# Patient Record
Sex: Female | Born: 1935
Health system: Southern US, Community
[De-identification: ages and names within clinical notes are randomized; demographics above are authoritative.]

## PROBLEM LIST (undated history)

## (undated) DIAGNOSIS — K648 Other hemorrhoids: Secondary | ICD-10-CM

## (undated) DIAGNOSIS — K219 Gastro-esophageal reflux disease without esophagitis: Secondary | ICD-10-CM

## (undated) DIAGNOSIS — Z860100 Personal history of colon polyps, unspecified: Secondary | ICD-10-CM

## (undated) DIAGNOSIS — K222 Esophageal obstruction: Secondary | ICD-10-CM

## (undated) DIAGNOSIS — N200 Calculus of kidney: Secondary | ICD-10-CM

## (undated) DIAGNOSIS — K5790 Diverticulosis of intestine, part unspecified, without perforation or abscess without bleeding: Secondary | ICD-10-CM

## (undated) DIAGNOSIS — F0280 Dementia in other diseases classified elsewhere without behavioral disturbance: Principal | ICD-10-CM

## (undated) DIAGNOSIS — K2270 Barrett's esophagus without dysplasia: Secondary | ICD-10-CM

## (undated) DIAGNOSIS — R413 Other amnesia: Secondary | ICD-10-CM

## (undated) DIAGNOSIS — Z90710 Acquired absence of both cervix and uterus: Secondary | ICD-10-CM

## (undated) DIAGNOSIS — F0390 Unspecified dementia without behavioral disturbance: Secondary | ICD-10-CM

## (undated) DIAGNOSIS — E039 Hypothyroidism, unspecified: Secondary | ICD-10-CM

## (undated) DIAGNOSIS — I10 Essential (primary) hypertension: Secondary | ICD-10-CM

## (undated) DIAGNOSIS — G3101 Pick's disease: Principal | ICD-10-CM

## (undated) HISTORY — DX: Acquired absence of both cervix and uterus: Z90.710

## (undated) HISTORY — DX: Pick's disease: G31.01

## (undated) HISTORY — DX: Personal history of colon polyps, unspecified: Z86.0100

## (undated) HISTORY — PX: TONSILLECTOMY: SHX5217

## (undated) HISTORY — PX: APPENDECTOMY: SHX54

## (undated) HISTORY — DX: Essential (primary) hypertension: I10

## (undated) HISTORY — DX: Unspecified dementia, unspecified severity, without behavioral disturbance, psychotic disturbance, mood disturbance, and anxiety: F03.90

## (undated) HISTORY — DX: Personal history of colonic polyps: Z86.010

## (undated) HISTORY — DX: Other hemorrhoids: K64.8

## (undated) HISTORY — DX: Dementia in other diseases classified elsewhere without behavioral disturbance: F02.80

## (undated) HISTORY — DX: Diverticulosis of intestine, part unspecified, without perforation or abscess without bleeding: K57.90

## (undated) HISTORY — PX: BREAST EXCISIONAL BIOPSY: SUR124

## (undated) HISTORY — DX: Gastro-esophageal reflux disease without esophagitis: K21.9

## (undated) HISTORY — PX: THYROIDECTOMY, PARTIAL: SHX18

## (undated) HISTORY — DX: Esophageal obstruction: K22.2

## (undated) HISTORY — DX: Barrett's esophagus without dysplasia: K22.70

## (undated) HISTORY — DX: Other amnesia: R41.3

## (undated) HISTORY — DX: Hypothyroidism, unspecified: E03.9

## (undated) HISTORY — DX: Calculus of kidney: N20.0

---

## 1997-12-15 ENCOUNTER — Other Ambulatory Visit: Admission: RE | Admit: 1997-12-15 | Discharge: 1997-12-15 | Payer: Self-pay | Admitting: Internal Medicine

## 1998-01-17 ENCOUNTER — Encounter: Payer: Self-pay | Admitting: Internal Medicine

## 1999-01-22 ENCOUNTER — Other Ambulatory Visit: Admission: RE | Admit: 1999-01-22 | Discharge: 1999-01-22 | Payer: Self-pay | Admitting: Internal Medicine

## 2000-01-21 ENCOUNTER — Encounter: Payer: Self-pay | Admitting: Internal Medicine

## 2000-01-21 ENCOUNTER — Encounter: Admission: RE | Admit: 2000-01-21 | Discharge: 2000-01-21 | Payer: Self-pay | Admitting: Internal Medicine

## 2000-01-29 ENCOUNTER — Other Ambulatory Visit: Admission: RE | Admit: 2000-01-29 | Discharge: 2000-01-29 | Payer: Self-pay | Admitting: Internal Medicine

## 2000-10-21 ENCOUNTER — Encounter: Payer: Self-pay | Admitting: Emergency Medicine

## 2000-10-21 ENCOUNTER — Emergency Department (HOSPITAL_COMMUNITY): Admission: EM | Admit: 2000-10-21 | Discharge: 2000-10-21 | Payer: Self-pay | Admitting: Emergency Medicine

## 2001-01-28 ENCOUNTER — Encounter: Payer: Self-pay | Admitting: Internal Medicine

## 2001-01-28 ENCOUNTER — Encounter: Admission: RE | Admit: 2001-01-28 | Discharge: 2001-01-28 | Payer: Self-pay | Admitting: Internal Medicine

## 2002-03-10 ENCOUNTER — Encounter: Payer: Self-pay | Admitting: Internal Medicine

## 2002-03-10 ENCOUNTER — Encounter: Admission: RE | Admit: 2002-03-10 | Discharge: 2002-03-10 | Payer: Self-pay | Admitting: Internal Medicine

## 2003-04-04 ENCOUNTER — Encounter: Payer: Self-pay | Admitting: Internal Medicine

## 2003-04-04 ENCOUNTER — Encounter: Admission: RE | Admit: 2003-04-04 | Discharge: 2003-04-04 | Payer: Self-pay | Admitting: Internal Medicine

## 2003-04-10 ENCOUNTER — Encounter (INDEPENDENT_AMBULATORY_CARE_PROVIDER_SITE_OTHER): Payer: Self-pay | Admitting: *Deleted

## 2003-04-10 ENCOUNTER — Encounter: Payer: Self-pay | Admitting: Internal Medicine

## 2003-04-10 ENCOUNTER — Encounter: Admission: RE | Admit: 2003-04-10 | Discharge: 2003-04-10 | Payer: Self-pay | Admitting: Internal Medicine

## 2003-05-22 ENCOUNTER — Encounter (INDEPENDENT_AMBULATORY_CARE_PROVIDER_SITE_OTHER): Payer: Self-pay | Admitting: *Deleted

## 2003-05-22 ENCOUNTER — Ambulatory Visit (HOSPITAL_COMMUNITY): Admission: RE | Admit: 2003-05-22 | Discharge: 2003-05-22 | Payer: Self-pay | Admitting: *Deleted

## 2003-05-22 ENCOUNTER — Encounter: Payer: Self-pay | Admitting: Internal Medicine

## 2004-05-13 ENCOUNTER — Encounter: Admission: RE | Admit: 2004-05-13 | Discharge: 2004-05-13 | Payer: Self-pay | Admitting: Internal Medicine

## 2005-05-28 ENCOUNTER — Encounter: Admission: RE | Admit: 2005-05-28 | Discharge: 2005-05-28 | Payer: Self-pay | Admitting: Internal Medicine

## 2006-06-04 ENCOUNTER — Encounter: Admission: RE | Admit: 2006-06-04 | Discharge: 2006-06-04 | Payer: Self-pay | Admitting: Internal Medicine

## 2007-01-12 ENCOUNTER — Ambulatory Visit (HOSPITAL_COMMUNITY): Admission: RE | Admit: 2007-01-12 | Discharge: 2007-01-12 | Payer: Self-pay | Admitting: Internal Medicine

## 2007-06-07 ENCOUNTER — Ambulatory Visit (HOSPITAL_COMMUNITY): Admission: RE | Admit: 2007-06-07 | Discharge: 2007-06-07 | Payer: Self-pay | Admitting: Internal Medicine

## 2007-06-10 ENCOUNTER — Ambulatory Visit (HOSPITAL_COMMUNITY): Admission: RE | Admit: 2007-06-10 | Discharge: 2007-06-10 | Payer: Self-pay | Admitting: Internal Medicine

## 2007-06-10 ENCOUNTER — Encounter (INDEPENDENT_AMBULATORY_CARE_PROVIDER_SITE_OTHER): Payer: Self-pay | Admitting: *Deleted

## 2008-06-07 ENCOUNTER — Ambulatory Visit (HOSPITAL_COMMUNITY): Admission: RE | Admit: 2008-06-07 | Discharge: 2008-06-07 | Payer: Self-pay | Admitting: Internal Medicine

## 2008-09-13 ENCOUNTER — Ambulatory Visit (HOSPITAL_COMMUNITY): Admission: RE | Admit: 2008-09-13 | Discharge: 2008-09-13 | Payer: Self-pay | Admitting: Internal Medicine

## 2008-09-29 ENCOUNTER — Encounter: Payer: Self-pay | Admitting: Internal Medicine

## 2009-03-16 ENCOUNTER — Encounter: Payer: Self-pay | Admitting: Internal Medicine

## 2009-04-18 ENCOUNTER — Ambulatory Visit: Payer: Self-pay | Admitting: Internal Medicine

## 2009-04-18 DIAGNOSIS — R933 Abnormal findings on diagnostic imaging of other parts of digestive tract: Secondary | ICD-10-CM | POA: Insufficient documentation

## 2009-04-18 DIAGNOSIS — R1319 Other dysphagia: Secondary | ICD-10-CM

## 2009-04-18 DIAGNOSIS — R1013 Epigastric pain: Secondary | ICD-10-CM

## 2009-05-03 ENCOUNTER — Ambulatory Visit: Payer: Self-pay | Admitting: Internal Medicine

## 2009-05-03 ENCOUNTER — Encounter: Payer: Self-pay | Admitting: Internal Medicine

## 2009-05-07 ENCOUNTER — Encounter: Payer: Self-pay | Admitting: Internal Medicine

## 2009-06-08 ENCOUNTER — Ambulatory Visit: Payer: Self-pay | Admitting: Internal Medicine

## 2009-06-08 DIAGNOSIS — K222 Esophageal obstruction: Secondary | ICD-10-CM | POA: Insufficient documentation

## 2009-06-08 DIAGNOSIS — K2270 Barrett's esophagus without dysplasia: Secondary | ICD-10-CM

## 2009-06-08 DIAGNOSIS — K219 Gastro-esophageal reflux disease without esophagitis: Secondary | ICD-10-CM | POA: Insufficient documentation

## 2009-06-09 ENCOUNTER — Emergency Department (HOSPITAL_COMMUNITY): Admission: EM | Admit: 2009-06-09 | Discharge: 2009-06-09 | Payer: Self-pay | Admitting: Emergency Medicine

## 2009-12-03 ENCOUNTER — Ambulatory Visit (HOSPITAL_COMMUNITY): Admission: RE | Admit: 2009-12-03 | Discharge: 2009-12-03 | Payer: Self-pay | Admitting: Internal Medicine

## 2010-03-13 ENCOUNTER — Encounter: Payer: Self-pay | Admitting: Internal Medicine

## 2010-03-18 ENCOUNTER — Encounter: Payer: Self-pay | Admitting: Internal Medicine

## 2010-04-22 ENCOUNTER — Encounter: Payer: Self-pay | Admitting: Internal Medicine

## 2010-05-31 ENCOUNTER — Encounter (INDEPENDENT_AMBULATORY_CARE_PROVIDER_SITE_OTHER): Payer: Self-pay | Admitting: *Deleted

## 2010-06-03 ENCOUNTER — Ambulatory Visit: Payer: Self-pay | Admitting: Internal Medicine

## 2010-06-20 ENCOUNTER — Ambulatory Visit: Payer: Self-pay | Admitting: Internal Medicine

## 2010-06-21 ENCOUNTER — Encounter: Payer: Self-pay | Admitting: Internal Medicine

## 2010-09-24 NOTE — Letter (Signed)
Summary: Previsit letter  [name]  [address] [place], [place] [address]   Phone: [phone]  Fax: [phone]       04/22/2010 MRN: [id number]  Christina Camacho [address] [place], [place]  [address]  Dear Ms. Dauber,  Welcome to the Gastroenterology Division at [facility].    You are scheduled to see a nurse for your pre-procedure visit on 06-03-10 at 10am on the 3rd floor at [facility], [address]  We ask that you try to arrive at our office 15 minutes prior to your appointment time to allow for check-in. Your Endoscopy is scheduled for 06-20-10 9am.  Your nurse visit will consist of discussing your medical and surgical history, your immediate family medical history, and your medications.    Please bring a complete list of all your medications or, if you prefer, bring the medication bottles and we will list them.  We will need to be aware of both prescribed and over the counter drugs.  We will need to know exact dosage information as well.  If you are on blood thinners (Coumadin, Plavix, Aggrenox, Ticlid, etc.) please call our office today/prior to your appointment, as we need to consult with your physician about holding your medication.   Please be prepared to read and sign documents such as consent forms, a financial agreement, and acknowledgement forms.  If necessary, and with your consent, a friend or relative is welcome to sit-in on the nurse visit with you.  Please bring your insurance card so that we may make a copy of it.  If your insurance requires a referral to see a specialist, please bring your referral form from your primary care physician.  No co-pay is required for this nurse visit.     If you cannot keep your appointment, please call [phone] to cancel or reschedule prior to your appointment date.  This allows [place] the opportunity to schedule an appointment for another patient in need of care.    Thank you for choosing  [facility] for your medical needs.  We appreciate the opportunity to care for you.  Please visit [place] at our website  to learn more about our practice.                     Sincerely.                                                                                                                   The Gastroenterology Division

## 2010-09-24 NOTE — Procedures (Signed)
Summary: Recall Assessment  Recall Assessment   Imported By: [place] 06/24/2010 10:18:31  _____________________________________________________________________  External Attachment:    Type:   Image     Comment:   External Document

## 2010-09-24 NOTE — Procedures (Signed)
Summary: Upper Endoscopy  Patient: [name] Note: All result statuses are Final unless otherwise noted.  Tests: (1) Upper Endoscopy (EGD)   EGD Upper Endoscopy       DONE     [facility]     [address]     [place], [place]  [address]           ENDOSCOPY PROCEDURE REPORT           PATIENT:  Christina Camacho, Christina Camacho  MR#:  [id number]     BIRTHDATE:  [birth year], 81 yrs. old  GENDER:  female           ENDOSCOPIST:  Wilhemina Bonito. Eda Keys, MD     Referred by:  Surveillance Program Recall           PROCEDURE DATE:  06/20/2010     PROCEDURE:  EGD with biopsy, [address]     ASA CLASS:  Class II     INDICATIONS:  h/o Barrett's Esophagus ; INDEX EXAM 04-2009 W/ SHORT     SEGMENT BARRETS W/O DYSPLASIA           MEDICATIONS:   Fentanyl 25 mcg IV, Versed 3 mg IV     TOPICAL ANESTHETIC:  Exactacain Spray           DESCRIPTION OF PROCEDURE:   After the risks benefits and     alternatives of the procedure were thoroughly explained, informed     consent was obtained.  The LB GIF-H180 [id number] endoscope was     introduced through the mouth and advanced to the second portion of     the duodenum, without limitations.  The instrument was slowly     withdrawn as the mucosa was fully examined.     <<PROCEDUREIMAGES>>           Short segment (1cm tongue) Barrett's esophagus was found in the     distal esophagus.  A benign 15mm stricture was found in the distal     esophagus.  Otherwise the examination was normal to D2.     Retroflexed views revealed no abnormalities.    The scope was then     withdrawn from the patient and the procedure completed.           COMPLICATIONS:  None           ENDOSCOPIC IMPRESSION:     1) Short segment Barrett's esophagus in the distal esophagus     2) Stricture in the distal esophagus     3) Otherwise normal examination     4) GERD           RECOMMENDATIONS:     1) Await pathology results     2) REPEAT SURVEILLANCE EGD IN 3 YEARS, if no dysplasia     3)  Continue Prilosec           ______________________________     Wilhemina Bonito. Eda Keys, MD           CC:  Geoffry Paradise, MD, The Patient           n.     eSIGNED:   Wilhemina Bonito. Eda Keys at 06/20/2010 10:00 AM           Loyal Gambler, [id number]  Note: An exclamation mark (!) indicates a result that was not dispersed into the flowsheet. Document Creation Date: 06/20/2010 10:02 AM _______________________________________________________________________  (1) Order result status: Final Collection or observation date-time: 06/20/2010 09:50 Requested date-time:  Receipt  date-time:  Reported date-time:  Referring Physician:   Ordering Physician: Fransico Setters [phone]) Specimen Source:  Source: Launa Grill Order Number: [id number] Lab site:   Appended Document: Upper Endoscopy     Procedures Next Due Date:    EGD: 05/2013

## 2010-09-24 NOTE — Letter (Signed)
Summary: Patient Notice-[name]  [address] [name], [place] [address]   Phone: [phone]  Fax: [phone]        June 21, 2010 MRN: [id number]    Christina Camacho [address] [place], [place]  [address]    Dear Ms. Staniszewski,  I am pleased to inform you that the biopsies taken during your recent endoscopic examination did not show any evidence of dysplasia or cancer upon pathologic examination.  However, your biopsies indicate you have a condition known as Barrett's esophagus. While not cancer, it is pre-cancerous (can progress to cancer) and needs to be monitored with repeat endoscopic examination and biopsies.  Fortunately, it is quite rare that this develops into cancer, but careful monitoring of the condition along with taking your medication as prescribed is important in reducing the risk of developing cancer.  It is my recommendation that you have a repeat upper gastrointestinal endoscopic examination in 3 years.  Additional information/recommendations:   __Continue with treatment plan as outlined the day of your exam.    Please call us if you have or develop heartburn, reflux symptoms, any swallowing problems, or if you have questions about your condition that have not been fully answered at this time.  Sincerely,  Hilarie Fredrickson MD  This letter has been electronically signed by your physician.  Appended Document: Patient Notice-[facility] letter mailed

## 2010-09-24 NOTE — Miscellaneous (Signed)
Summary: previsit/RM  Clinical Lists Changes  Observations: Added new observation of ALLERGY REV: Done (06/03/2010 9:59)

## 2010-09-24 NOTE — Letter (Signed)
Summary: EGD Instructions  [facility]  [address] [place], [place] [address]   Phone: [phone]  Fax: [phone]       Christina Camacho    Feb 25, 1929    MRN: [id number]       Procedure Day Dorna Bloom:  Lenor Coffin  06/20/10     Arrival Time:  8:00AM     Procedure Time:  9:00AM     Location of Procedure:                    _ X _ [facility] (4th Floor)   PREPARATION FOR ENDOSCOPY   On 06/20/10 THE DAY OF THE PROCEDURE:  1.   No solid foods, milk or milk products are allowed after midnight the night before your procedure.  2.   Do not drink anything colored red or purple.  Avoid juices with pulp.  No orange juice.  3.  You may drink clear liquids until 7:00AM, which is 2 hours before your procedure.                                                                                                CLEAR LIQUIDS INCLUDE: Water Jello Ice Popsicles Tea (sugar ok, no milk/cream) Powdered fruit flavored drinks Coffee (sugar ok, no milk/cream) Gatorade Juice: apple, white grape, white cranberry  Lemonade Clear bullion, consomm, broth Carbonated beverages (any kind) Strained chicken noodle soup Hard Candy   MEDICATION INSTRUCTIONS  Unless otherwise instructed, you should take regular prescription medications with a small sip of water as early as possible the morning of your procedure.    Additional medication instructions: Hold Triamterene-HCTZ morning of procedure only.  May take other prescription medications morning of procedure.             OTHER INSTRUCTIONS  You will need a responsible adult at least 75 years of age to accompany you and drive you home.   This person must remain in the waiting room during your procedure.  Wear loose fitting clothing that is easily removed.  Leave jewelry and other valuables at home.  However, you may wish to bring a book to read or an iPod/MP3 player to listen to music as you wait for your procedure  to start.  Remove all body piercing jewelry and leave at home.  Total time from sign-in until discharge is approximately 2-3 hours.  You should go home directly after your procedure and rest.  You can resume normal activities the day after your procedure.  The day of your procedure you should not:   Drive   Make legal decisions   Operate machinery   Drink alcohol   Return to work  You will receive specific instructions about eating, activities and medications before you leave.    The above instructions have been reviewed and explained to me by   Sherren Kerns RN  June 03, 2010 10:27 AM    I fully understand and can verbalize these instructions _____________________________ Date _________

## 2010-09-24 NOTE — Letter (Signed)
Summary: Endoscopy Letter  [facility]  [address] [place], [place] [address]   Phone: [phone]  Fax: [phone]      March 18, 2010 MRN: [id number]   Christina Camacho [address] [place], [place]  [address]   Dear Ms. Kassin,   According to your medical record, it is time for you to schedule an Endoscopy. Endoscopic screening is recommended for patients with certain upper digestive tract conditions because of associated increased risk for cancers of the upper digestive system.  This letter has been generated based on the recommendations made at the time of your prior procedure. If you feel that in your particular situation this may no longer apply, please contact our office.  Please call our office at [phone]) to schedule this appointment or to update your records at your earliest convenience.  Thank you for cooperating with [place] to provide you with the very best care possible.   Sincerely,  Wilhemina Bonito. Marina Goodell, M.D.  [facility] [phone]

## 2010-11-20 ENCOUNTER — Other Ambulatory Visit (HOSPITAL_COMMUNITY): Payer: Self-pay | Admitting: Internal Medicine

## 2010-11-20 DIAGNOSIS — Z1231 Encounter for screening mammogram for malignant neoplasm of breast: Secondary | ICD-10-CM

## 2010-11-28 LAB — URINE CULTURE

## 2010-11-28 LAB — URINALYSIS, ROUTINE W REFLEX MICROSCOPIC
Glucose, UA: NEGATIVE mg/dL
Nitrite: NEGATIVE
Protein, ur: 100 mg/dL — AB

## 2010-11-28 LAB — URINE MICROSCOPIC-ADD ON

## 2011-01-10 NOTE — Op Note (Signed)
   NAME:  Christina Camacho, Christina Camacho NO.:  [id number]   MEDICAL RECORD NO.:  [id number]                   PATIENT TYPE:  AMB   LOCATION:  ENDO                                 FACILITY:  [facility]   PHYSICIAN:  Georgiana Spinner, M.D.                 DATE OF BIRTH:  [birth year]   DATE OF PROCEDURE:  DATE OF DISCHARGE:                                 OPERATIVE REPORT   PROCEDURE:  Colonoscopy.   INDICATIONS:  Colon polyps.   ANESTHESIA:  Demerol 90 mg, Versed 9.   DESCRIPTION OF PROCEDURE:  With the patient mildly sedated and in the left  lateral decubitus position, the Olympus videoscopic colonoscope was inserted  in the rectum and passed through a rather tortuous colon to the cecum,  identified by base of cecum and ileocecal valve, both of which were  photographed.  From this point,  the colonoscope was slowly withdrawn,  taking circumferential views of the entire colonic mucosa stopping only then  in the rectum which appeared normal on direct and retroflexed view.  The  endoscope was straightened and withdrawn.  The patient's vital signs and  pulse oximetry remained stable.  The patient tolerated the procedure well  without apparent complications.   FINDINGS:  1. Diverticulosis of the sigmoid colon.  2. Somewhat tortuous colon.  3. Otherwise an unremarkable colonoscopic examination.   PLAN:  Repeat examination possibly in about five years.                                               Georgiana Spinner, M.D.    GMO/MEDQ  D:  05/22/2003  T:  05/22/2003  Job:  [id number]   cc:   Geoffry Paradise, M.D.  [address]  [place]  [place] [address]  Fax: [phone]

## 2011-01-31 ENCOUNTER — Ambulatory Visit (HOSPITAL_COMMUNITY)
Admission: RE | Admit: 2011-01-31 | Discharge: 2011-01-31 | Disposition: A | Discharge status: Home / Self Care | Payer: Medicare Other | Source: Ambulatory Visit | Attending: Internal Medicine | Admitting: Internal Medicine

## 2011-01-31 DIAGNOSIS — Z1231 Encounter for screening mammogram for malignant neoplasm of breast: Secondary | ICD-10-CM | POA: Insufficient documentation

## 2012-01-12 ENCOUNTER — Other Ambulatory Visit (HOSPITAL_COMMUNITY): Payer: Self-pay | Admitting: Internal Medicine

## 2012-01-12 DIAGNOSIS — Z1231 Encounter for screening mammogram for malignant neoplasm of breast: Secondary | ICD-10-CM

## 2012-07-08 ENCOUNTER — Ambulatory Visit (HOSPITAL_COMMUNITY)
Admission: RE | Admit: 2012-07-08 | Discharge: 2012-07-08 | Disposition: A | Discharge status: Home / Self Care | Payer: Medicare Other | Source: Ambulatory Visit | Attending: Internal Medicine | Admitting: Internal Medicine

## 2012-07-08 DIAGNOSIS — Z1231 Encounter for screening mammogram for malignant neoplasm of breast: Secondary | ICD-10-CM | POA: Insufficient documentation

## 2012-07-15 ENCOUNTER — Ambulatory Visit (HOSPITAL_COMMUNITY): Payer: Medicare Other

## 2012-08-09 ENCOUNTER — Encounter: Payer: Self-pay | Admitting: *Deleted

## 2012-08-10 ENCOUNTER — Ambulatory Visit (INDEPENDENT_AMBULATORY_CARE_PROVIDER_SITE_OTHER): Payer: Medicare Other | Admitting: Cardiovascular Disease

## 2012-08-10 ENCOUNTER — Encounter: Payer: Self-pay | Admitting: Cardiovascular Disease

## 2012-08-10 VITALS — BP 124/62 | HR 64 | Ht 61.0 in | Wt 111.0 lb

## 2012-08-10 DIAGNOSIS — I10 Essential (primary) hypertension: Secondary | ICD-10-CM | POA: Insufficient documentation

## 2012-08-10 DIAGNOSIS — R079 Chest pain, unspecified: Secondary | ICD-10-CM

## 2012-08-10 DIAGNOSIS — R9431 Abnormal electrocardiogram [ECG] [EKG]: Secondary | ICD-10-CM

## 2012-08-10 NOTE — Assessment & Plan Note (Signed)
Well controlled.  Continue current medications and low sodium Dash type diet.   Continue diuretic 

## 2012-08-10 NOTE — Patient Instructions (Addendum)
Your physician recommends that you schedule a follow-up appointment in: AS NEEDED  Your physician recommends that you continue on your current medications as directed. Please refer to the Current Medication list given to you today.  Your physician has requested that you have en exercise stress myoview. For further information please visit [email or web address] Please follow instruction sheet, as given. DX ABN EKG  CHEST PAIN

## 2012-08-10 NOTE — Assessment & Plan Note (Signed)
Atypical but elderly with abnormal ECG F/U stress myovue

## 2012-08-10 NOTE — Progress Notes (Signed)
Patient ID: Christina Camacho, female   DOB: Jan 26, 1929, 78 y.o.   MRN: [id number] 78 yo referred by [facility] medical Dr Jacky Kindle.  Previous husband died of AV failure and current husband just had CABG.  CRF;s HTN Rx with diuretic for last two years.  She has had SSCP Atypical and not always exertional Sometimes feels pain when laying down.  No dyspnea palpitations or syncope Compliant with BP meds.  No recent stress testing.  ECG abnormal with septal Q;s nonspecific lateral T wave changes and poor R wave progression.  Can walk well enough on treadmill.  Pain has been for a couple of months but is gettinng better.  Reviewed labs and TSH .37 on replacement followed by Dr Jacky Kindle Other labs from 11/11/ 13 normal.  Has GERD with Barretts but pains have no GI overtones  ROS: Denies fever, malais, weight loss, blurry vision, decreased visual acuity, cough, sputum, SOB, hemoptysis, pleuritic pain, palpitaitons, heartburn, abdominal pain, melena, lower extremity edema, claudication, or rash.  All other systems reviewed and negative   General: Affect appropriate Healthy:  appears stated age HEENT: normal Neck supple with no adenopathy JVP normal no bruits no thyromegaly Lungs clear with no wheezing and good diaphragmatic motion Heart:  S1/S2 no murmur,rub, gallop or click PMI normal Abdomen: benighn, BS positve, no tenderness, no AAA no bruit.  No HSM or HJR Distal pulses intact with no bruits No edema Neuro non-focal Skin warm and dry No muscular weakness  Medications Current Outpatient Prescriptions  Medication Sig Dispense Refill  . aspirin 81 MG tablet Take 81 mg by mouth daily.      . Calcium 600-200 MG-UNIT per tablet Take 1 tablet by mouth 3 (three) times daily with meals.      . cholecalciferol (VITAMIN D) 1000 UNITS tablet Take 1,000 Units by mouth 3 (three) times daily.      Marland Kitchen levothyroxine (SYNTHROID, LEVOTHROID) 50 MCG tablet Take by mouth daily. 50mg  Monday thru Friday. Saturday and  Sunday 25mg  (half tablet)      . Multiple Vitamins-Minerals (ICAPS MV) TABS Take 1 tablet by mouth daily.      . nebivolol (BYSTOLIC) 10 MG tablet Take 10 mg by mouth daily.      Marland Kitchen omeprazole (PRILOSEC) 20 MG capsule Take 20 mg by mouth daily.       Marland Kitchen triamterene-hydrochlorothiazide (MAXZIDE-25) 37.5-25 MG per tablet Take 1 tablet by mouth daily.       . zoledronic acid (RECLAST) 5 MG/100ML SOLN Inject 5 mg into the vein once. yearly        Allergies Review of patient's allergies indicates no known allergies.  Family History: No family history on file.  Social History: History   Social History  . Marital Status: Married    Spouse Name: N/A    Number of Children: N/A  . Years of Education: N/A   Occupational History  . Not on file.   Social History Main Topics  . Smoking status: Former Smoker -- 1.0 packs/day for 38 years    Types: Cigarettes    Start date: 08/25/1948    Quit date: 08/10/1987  . Smokeless tobacco: Not on file  . Alcohol Use: No  . Drug Use: No  . Sexually Active: Not on file   Other Topics Concern  . Not on file   Social History Narrative  . No narrative on file    Electrocardiogram: NSR rate 69 Poor R wave progression nonspecfic ST changes   Assessment and Plan

## 2012-08-16 ENCOUNTER — Encounter: Payer: Self-pay | Admitting: Cardiovascular Disease

## 2012-08-31 ENCOUNTER — Ambulatory Visit (HOSPITAL_COMMUNITY): Payer: Medicare Other | Attending: Cardiology | Admitting: Radiology

## 2012-08-31 VITALS — BP 135/79 | HR 57 | Ht 61.0 in | Wt 107.0 lb

## 2012-08-31 DIAGNOSIS — R079 Chest pain, unspecified: Secondary | ICD-10-CM | POA: Insufficient documentation

## 2012-08-31 DIAGNOSIS — I10 Essential (primary) hypertension: Secondary | ICD-10-CM | POA: Insufficient documentation

## 2012-08-31 DIAGNOSIS — R9431 Abnormal electrocardiogram [ECG] [EKG]: Secondary | ICD-10-CM

## 2012-08-31 MED ORDER — TECHNETIUM TC 99M SESTAMIBI GENERIC - CARDIOLITE
32.3000 | Freq: Once | INTRAVENOUS | Status: AC | PRN
  Administered 2012-08-31: 32.3 via INTRAVENOUS

## 2012-08-31 MED ORDER — TECHNETIUM TC 99M SESTAMIBI GENERIC - CARDIOLITE
9.2000 | Freq: Once | INTRAVENOUS | Status: AC | PRN
  Administered 2012-08-31: 9 via INTRAVENOUS

## 2012-08-31 NOTE — Progress Notes (Addendum)
[facility] SITE 3 NUCLEAR MED [address] [place], [place] [address] [phone]    Cardiology Nuclear Med Study  Christina Camacho is a 79 y.o. female     MRN : [id number]     DOB: 1928/11/02  Procedure Date: 08/31/2012  Nuclear Med Background Indication for Stress Test:  Evaluation for Ischemia and Abnormal EKG History:  ~10 yrs ago Echo:OK per patient Cardiac Risk Factors: History of Smoking and Hypertension  Symptoms:  Chest Pain with and without Exertion (last episode of chest discomfort was about 7-weeks ago with stress of husbands CABG)   Nuclear Pre-Procedure Caffeine/Decaff Intake:  None NPO After: 6:30pm   Lungs:  Clear. O2 Sat: 98% on room air. IV 0.9% NS with Angio Cath:  22g  IV Site: R Antecubital  IV Started by:  Doyne Keel, CNMT  Chest Size (in):  36 Cup Size: B  Height: 5\' 1"  (1.549 m)  Weight:  107 lb (48.535 kg)  BMI:  Body mass index is 20.22 kg/(m^2). Tech Comments:  Bystolic not held per Dr. Eden Emms.    Nuclear Med Study 1 or 2 day study: 1 day  Stress Test Type:  Stress  Reading MD: Willa Rough, MD  Order Authorizing Provider:  Charlton Haws, MD  Resting Radionuclide: Technetium 45m Sestamibi  Resting Radionuclide Dose: 9.2 mCi   Stress Radionuclide:  Technetium 37m Sestamibi  Stress Radionuclide Dose: 32.3 mCi           Stress Protocol Rest HR: 57 Stress HR: 120  Rest BP: 135/79 Stress BP: 204/74  Exercise Time (min): 7:00 METS: 8.5   Predicted Max HR: 139 bpm % Max HR: 86.33 bpm Rate Pressure Product: 69629    Dose of Adenosine (mg):  n/a Dose of Lexiscan: n/a mg  Dose of Atropine (mg): n/a Dose of Dobutamine: n/a mcg/kg/min (at max HR)  Stress Test Technologist: Smiley Houseman, CMA-N  Nuclear Technologist:  Domenic Polite, CNMT     Rest Procedure:  Myocardial perfusion imaging was performed at rest 45 minutes following the intravenous administration of Technetium 81m Sestamibi.  Rest ECG: Normal sinus rhythm. Inverted T  wave in aVL. Mild diffuse ST scooping  Stress Procedure:  The patient exercised on the treadmill utilizing the Bruce Protocol for seven  minutes. The patient stopped due to fatigue and denied any chest pain.  Technetium 30m Sestamibi was injected at peak exercise and myocardial perfusion imaging was performed after a brief delay.  Stress ECG: With peak stress the ST scooping becomes slightly more marked. There is no diagnostic change.  QPS Raw Data Images:  Patient motion noted; appropriate software correction applied. Stress Images:  Normal homogeneous uptake in all areas of the myocardium. Rest Images:  Normal homogeneous uptake in all areas of the myocardium. Subtraction (SDS):  No evidence of ischemia. Transient Ischemic Dilatation (Normal <1.22):  0.90 Lung/Heart Ratio (Normal <0.45):  0.34  Quantitative Gated Spect Images QGS EDV:  54 ml QGS ESV:  11 ml  Impression Exercise Capacity:  Good exercise capacity. BP Response:  Hypertensive blood pressure response. Clinical Symptoms:  No significant symptoms noted. ECG Impression:  No significant ST segment change suggestive of ischemia. Comparison with Prior Nuclear Study: No previous nuclear study performed  Overall Impression:  Normal stress nuclear study. This is a low risk scan.  LV Ejection Fraction: 80%.  LV Wall Motion:  Normal Wall Motion.  Willa Rough. MD

## 2013-05-16 ENCOUNTER — Encounter: Payer: Self-pay | Admitting: Internal Medicine

## 2013-08-30 ENCOUNTER — Other Ambulatory Visit (HOSPITAL_COMMUNITY): Payer: Self-pay | Admitting: Internal Medicine

## 2013-08-30 DIAGNOSIS — Z1231 Encounter for screening mammogram for malignant neoplasm of breast: Secondary | ICD-10-CM

## 2013-09-23 ENCOUNTER — Ambulatory Visit (HOSPITAL_COMMUNITY)
Admission: RE | Admit: 2013-09-23 | Discharge: 2013-09-23 | Disposition: A | Discharge status: Home / Self Care | Payer: Medicare Other | Source: Ambulatory Visit | Attending: Internal Medicine | Admitting: Internal Medicine

## 2013-09-23 DIAGNOSIS — Z1231 Encounter for screening mammogram for malignant neoplasm of breast: Secondary | ICD-10-CM

## 2013-11-14 ENCOUNTER — Encounter: Payer: Self-pay | Admitting: Internal Medicine

## 2014-03-17 ENCOUNTER — Encounter: Payer: Self-pay | Admitting: Internal Medicine

## 2014-03-17 ENCOUNTER — Ambulatory Visit (INDEPENDENT_AMBULATORY_CARE_PROVIDER_SITE_OTHER): Payer: Medicare Other | Admitting: Internal Medicine

## 2014-03-17 VITALS — BP 160/72 | HR 60 | Ht 59.5 in | Wt 111.0 lb

## 2014-03-17 DIAGNOSIS — K219 Gastro-esophageal reflux disease without esophagitis: Secondary | ICD-10-CM

## 2014-03-17 DIAGNOSIS — K2270 Barrett's esophagus without dysplasia: Secondary | ICD-10-CM

## 2014-03-17 NOTE — Progress Notes (Signed)
HISTORY OF PRESENT ILLNESS:  Christina Camacho is a 80 y.o. female with GERD complicated by Barrett's esophagus who presents today, with her husband, regarding surveillance upper endoscopy. Patient last underwent upper endoscopy October 2011. Short segment Barrett's without dysplasia. Followup in 3 years if medically fit and willing. She continues on omeprazole and ranitidine for GERD. Symptoms well controlled. No dysphagia. She remains very active and travels extensively. GI review of systems is otherwise negative.                       REVIEW OF SYSTEMS:  All non-GI ROS negative upon review  Past Medical History  Diagnosis Date  . Hypertension   . Hypothyroidism   . History of colonic polyps   . Kidney stone     pt denies 03/17/14  . Diverticulosis   . History of hysterectomy   . Barrett's esophagus   . GERD (gastroesophageal reflux disease)   . Esophageal stricture   . Internal hemorrhoids     Past Surgical History  Procedure Laterality Date  . Appendectomy      with hysterectomy  . Tonsillectomy    . Thyroidectomy, partial      Social History Christina Camacho  reports that she quit smoking about 26 years ago. Her smoking use included Cigarettes. She started smoking about 65 years ago. She has a 38 pack-year smoking history. She has never used smokeless tobacco. She reports that she does not drink alcohol or use illicit drugs.  family history includes Emphysema in her father; Stroke in her brother.  No Known Allergies     PHYSICAL EXAMINATION: Vital signs: BP 160/72  Pulse 60  Ht 4' 11.5" (1.511 m)  Wt 111 lb (50.349 kg)  BMI 22.05 kg/m2 General: Well-developed, well-nourished, no acute distress HEENT: Sclerae are anicteric, conjunctiva pink. Oral mucosa intact Lungs: Clear Heart: Regular Abdomen: soft, nontender, nondistended, no obvious ascites, no peritoneal signs, normal bowel sounds. No organomegaly. Extremities: No edema Psychiatric: alert and oriented x3.  Cooperative     ASSESSMENT:  #1. GERD complicated by Barrett's esophagus. Last EGD with biopsies 2011. On dysplastic Barrett's   PLAN:  #1. We did discuss the pros and cons of surveillance endoscopy with biopsies. As well we discussed concerns over long term PPI use (per her request). He also discussed the state of current ablative therapies for dysplastic Barrett's, if needed. To this end, given her current state of excellent health, she was interested in proceeding.The nature of the procedure, as well as the risks, benefits, and alternatives were carefully and thoroughly reviewed with the patient. Ample time for discussion and questions allowed. The patient understood, was satisfied, and agreed to proceed.

## 2014-03-17 NOTE — Patient Instructions (Signed)
You have been scheduled for an endoscopy. Please follow written instructions given to you at your visit today. If you use inhalers (even only as needed), please bring them with you on the day of your procedure.   

## 2014-03-23 ENCOUNTER — Encounter: Payer: Self-pay | Admitting: Internal Medicine

## 2014-04-10 ENCOUNTER — Other Ambulatory Visit (HOSPITAL_COMMUNITY): Payer: Self-pay | Admitting: Internal Medicine

## 2014-04-10 DIAGNOSIS — Z1231 Encounter for screening mammogram for malignant neoplasm of breast: Secondary | ICD-10-CM

## 2014-05-10 ENCOUNTER — Ambulatory Visit (AMBULATORY_SURGERY_CENTER): Payer: Medicare Other | Admitting: Internal Medicine

## 2014-05-10 ENCOUNTER — Encounter: Payer: Self-pay | Admitting: Internal Medicine

## 2014-05-10 VITALS — BP 154/65 | HR 58 | Temp 97.4°F | Resp 16 | Ht 59.5 in | Wt 111.0 lb

## 2014-05-10 DIAGNOSIS — K2270 Barrett's esophagus without dysplasia: Secondary | ICD-10-CM

## 2014-05-10 DIAGNOSIS — K219 Gastro-esophageal reflux disease without esophagitis: Secondary | ICD-10-CM

## 2014-05-10 MED ORDER — SODIUM CHLORIDE 0.9 % IV SOLN
500.0000 mL | INTRAVENOUS | Status: DC
Start: 2014-05-10 — End: 2014-05-10

## 2014-05-10 NOTE — Op Note (Signed)
[place]  [employer] [place], [address]   ENDOSCOPY PROCEDURE REPORT  PATIENT: Christina Camacho, Christina Camacho  MR#: [id number] BIRTHDATE: 01-27-1929 , 84  yrs. old GENDER: Female ENDOSCOPIST: Eustace Quail, MD REFERRED BY:  Surveillance Program Recall PROCEDURE DATE:  05/10/2014 PROCEDURE:  EGD w/ biopsy ASA CLASS:     Class II INDICATIONS:  history of Barrett's esophagus. Index exam September 2010, followup October 2011. Non-dysplastic Barrett's. MEDICATIONS: MAC sedation, administered by CRNA and propofol (Diprivan) 120mg  IV TOPICAL ANESTHETIC: none  DESCRIPTION OF PROCEDURE: After the risks benefits and alternatives of the procedure were thoroughly explained, informed consent was obtained.  The LB [id number] endoscope was introduced through the mouth and advanced to the second portion of the duodenum. Without limitations.  The instrument was slowly withdrawn as the mucosa was fully examined.    EXAM:The esophagus revealed short segment distal Barrett's esophagus (C0, M1).  This was evaluated with white light, narrowed and imaging, and magnification.  No obvious mucosal irregularities. Ringlike large caliber stricture present.  Biopsies taken.  Stomach was normal.  The duodenum was normal.  Retroflexed views revealed a hiatal hernia.     The scope was then withdrawn from the patient and the procedure completed.  COMPLICATIONS: There were no complications. ENDOSCOPIC IMPRESSION: 1. GERD with short segment Barrett's esophagus 2. Asymptomatic esophageal stricture. Otherwise normal EGD.  RECOMMENDATIONS: 1.  Await pathology results. If no dysplasia, no further followup required 2.  Continue current indications for reflux  REPEAT EXAM:  eSigned:  Eustace Quail, MD 05/10/2014 1:50 PM   IO:XBDZHGD Reynaldo Minium, MD and The Patient

## 2014-05-10 NOTE — Progress Notes (Signed)
Patient denies any allergies to eggs or soy. 

## 2014-05-10 NOTE — Progress Notes (Signed)
Called to room to assist during endoscopic procedure.  Patient ID and intended procedure confirmed with present staff. Received instructions for my participation in the procedure from the performing physician.  

## 2014-05-10 NOTE — Progress Notes (Signed)
Procedure ends, to recovery, report given and VSS. 

## 2014-05-10 NOTE — Patient Instructions (Addendum)
YOU HAD AN ENDOSCOPIC PROCEDURE TODAY AT [facility]: Refer to the procedure report that was given to you for any specific questions about what was found during the examination.  If the procedure report does not answer your questions, please call your gastroenterologist to clarify.  If you requested that your care partner not be given the details of your procedure findings, then the procedure report has been included in a sealed envelope for you to review at your convenience later.  YOU SHOULD EXPECT: Some feelings of bloating in the abdomen. Passage of more gas than usual.  Walking can help get rid of the air that was put into your GI tract during the procedure and reduce the bloating. If you had a lower endoscopy (such as a colonoscopy or flexible sigmoidoscopy) you may notice spotting of blood in your stool or on the toilet paper. If you underwent a bowel prep for your procedure, then you may not have a normal bowel movement for a few days.  DIET: Your first meal following the procedure should be a light meal and then it is ok to progress to your normal diet.  A half-sandwich or bowl of soup is an example of a good first meal.  Heavy or fried foods are harder to digest and may make you feel nauseous or bloated.  Likewise meals heavy in dairy and vegetables can cause extra gas to form and this can also increase the bloating.  Drink plenty of fluids but you should avoid alcoholic beverages for 24 hours.  ACTIVITY: Your care partner should take you home directly after the procedure.  You should plan to take it easy, moving slowly for the rest of the day.  You can resume normal activity the day after the procedure however you should NOT DRIVE or use heavy machinery for 24 hours (because of the sedation medicines used during the test).    SYMPTOMS TO REPORT IMMEDIATELY: A gastroenterologist can be reached at any hour.  During normal business hours, 8:30 AM to 5:00 PM Monday through Friday,  call [phone].  After hours and on weekends, please call the GI answering service at [phone] who will take a message and have the physician on call contact you.     Following upper endoscopy (EGD)  Vomiting of blood or coffee ground material  New chest pain or pain under the shoulder blades  Painful or persistently difficult swallowing  New shortness of breath  Fever of 100F or higher  Black, tarry-looking stools  FOLLOW UP: If any biopsies were taken you will be contacted by phone or by letter within the next 1-3 weeks.  Call your gastroenterologist if you have not heard about the biopsies in 3 weeks.  Our staff will call the home number listed on your records the next business day following your procedure to check on you and address any questions or concerns that you may have at that time regarding the information given to you following your procedure. This is a courtesy call and so if there is no answer at the home number and we have not heard from you through the emergency physician on call, we will assume that you have returned to your regular daily activities without incident.  SIGNATURES/CONFIDENTIALITY: You and/or your care partner have signed paperwork which will be entered into your electronic medical record.  These signatures attest to the fact that that the information above on your After Visit Summary has been reviewed and is understood.  Full responsibility of the confidentiality of this discharge information lies with you and/or your care-partner.   Information on GERD given to you today  Await biopsy results

## 2014-05-11 ENCOUNTER — Telehealth: Payer: Self-pay

## 2014-05-11 NOTE — Telephone Encounter (Signed)
Left a message at [phone] for the pt to call if any questions or concerns. maw

## 2014-05-16 ENCOUNTER — Encounter: Payer: Self-pay | Admitting: Internal Medicine

## 2014-05-17 ENCOUNTER — Encounter: Payer: Self-pay | Admitting: Internal Medicine

## 2014-09-25 ENCOUNTER — Ambulatory Visit (HOSPITAL_COMMUNITY)
Admission: RE | Admit: 2014-09-25 | Discharge: 2014-09-25 | Disposition: A | Discharge status: Home / Self Care | Payer: PPO | Source: Ambulatory Visit | Attending: Internal Medicine | Admitting: Internal Medicine

## 2014-09-25 DIAGNOSIS — Z1231 Encounter for screening mammogram for malignant neoplasm of breast: Secondary | ICD-10-CM | POA: Diagnosis present

## 2015-09-06 DIAGNOSIS — E038 Other specified hypothyroidism: Secondary | ICD-10-CM | POA: Diagnosis not present

## 2015-09-06 DIAGNOSIS — M859 Disorder of bone density and structure, unspecified: Secondary | ICD-10-CM | POA: Diagnosis not present

## 2015-09-06 DIAGNOSIS — Z6821 Body mass index (BMI) 21.0-21.9, adult: Secondary | ICD-10-CM | POA: Diagnosis not present

## 2015-09-06 DIAGNOSIS — M5416 Radiculopathy, lumbar region: Secondary | ICD-10-CM | POA: Diagnosis not present

## 2015-09-06 DIAGNOSIS — I10 Essential (primary) hypertension: Secondary | ICD-10-CM | POA: Diagnosis not present

## 2015-09-06 DIAGNOSIS — K219 Gastro-esophageal reflux disease without esophagitis: Secondary | ICD-10-CM | POA: Diagnosis not present

## 2015-09-06 DIAGNOSIS — H8110 Benign paroxysmal vertigo, unspecified ear: Secondary | ICD-10-CM | POA: Diagnosis not present

## 2015-09-06 DIAGNOSIS — J302 Other seasonal allergic rhinitis: Secondary | ICD-10-CM | POA: Diagnosis not present

## 2015-09-06 DIAGNOSIS — K2270 Barrett's esophagus without dysplasia: Secondary | ICD-10-CM | POA: Diagnosis not present

## 2015-09-07 DIAGNOSIS — R8290 Unspecified abnormal findings in urine: Secondary | ICD-10-CM | POA: Diagnosis not present

## 2015-09-07 DIAGNOSIS — I10 Essential (primary) hypertension: Secondary | ICD-10-CM | POA: Diagnosis not present

## 2015-09-07 DIAGNOSIS — N390 Urinary tract infection, site not specified: Secondary | ICD-10-CM | POA: Diagnosis not present

## 2015-09-07 DIAGNOSIS — M859 Disorder of bone density and structure, unspecified: Secondary | ICD-10-CM | POA: Diagnosis not present

## 2015-09-07 DIAGNOSIS — E038 Other specified hypothyroidism: Secondary | ICD-10-CM | POA: Diagnosis not present

## 2015-09-12 DIAGNOSIS — K2270 Barrett's esophagus without dysplasia: Secondary | ICD-10-CM | POA: Diagnosis not present

## 2015-09-12 DIAGNOSIS — J302 Other seasonal allergic rhinitis: Secondary | ICD-10-CM | POA: Diagnosis not present

## 2015-09-12 DIAGNOSIS — M859 Disorder of bone density and structure, unspecified: Secondary | ICD-10-CM | POA: Diagnosis not present

## 2015-09-12 DIAGNOSIS — Z0000 Encounter for general adult medical examination without abnormal findings: Secondary | ICD-10-CM | POA: Diagnosis not present

## 2015-09-12 DIAGNOSIS — H8110 Benign paroxysmal vertigo, unspecified ear: Secondary | ICD-10-CM | POA: Diagnosis not present

## 2015-09-12 DIAGNOSIS — K219 Gastro-esophageal reflux disease without esophagitis: Secondary | ICD-10-CM | POA: Diagnosis not present

## 2015-09-12 DIAGNOSIS — M5416 Radiculopathy, lumbar region: Secondary | ICD-10-CM | POA: Diagnosis not present

## 2015-09-12 DIAGNOSIS — Z6821 Body mass index (BMI) 21.0-21.9, adult: Secondary | ICD-10-CM | POA: Diagnosis not present

## 2015-09-12 DIAGNOSIS — Z1389 Encounter for screening for other disorder: Secondary | ICD-10-CM | POA: Diagnosis not present

## 2015-09-12 DIAGNOSIS — I10 Essential (primary) hypertension: Secondary | ICD-10-CM | POA: Diagnosis not present

## 2015-09-12 DIAGNOSIS — E038 Other specified hypothyroidism: Secondary | ICD-10-CM | POA: Diagnosis not present

## 2015-10-03 DIAGNOSIS — Z6821 Body mass index (BMI) 21.0-21.9, adult: Secondary | ICD-10-CM | POA: Diagnosis not present

## 2015-10-03 DIAGNOSIS — R413 Other amnesia: Secondary | ICD-10-CM | POA: Diagnosis not present

## 2015-10-03 DIAGNOSIS — I10 Essential (primary) hypertension: Secondary | ICD-10-CM | POA: Diagnosis not present

## 2015-10-17 ENCOUNTER — Ambulatory Visit (INDEPENDENT_AMBULATORY_CARE_PROVIDER_SITE_OTHER): Payer: PPO | Admitting: Neurology

## 2015-10-17 ENCOUNTER — Encounter: Payer: Self-pay | Admitting: Neurology

## 2015-10-17 VITALS — BP 160/67 | HR 59 | Ht 59.5 in | Wt 108.5 lb

## 2015-10-17 DIAGNOSIS — F0280 Dementia in other diseases classified elsewhere without behavioral disturbance: Secondary | ICD-10-CM | POA: Insufficient documentation

## 2015-10-17 DIAGNOSIS — E538 Deficiency of other specified B group vitamins: Secondary | ICD-10-CM | POA: Diagnosis not present

## 2015-10-17 DIAGNOSIS — R413 Other amnesia: Secondary | ICD-10-CM | POA: Diagnosis not present

## 2015-10-17 DIAGNOSIS — G309 Alzheimer's disease, unspecified: Secondary | ICD-10-CM | POA: Insufficient documentation

## 2015-10-17 HISTORY — DX: Other amnesia: R41.3

## 2015-10-17 NOTE — Progress Notes (Signed)
Reason for visit: Memory disorder  Referring physician: Dr. Gerri Camacho is a 82 y.o. female  History of present illness:  Christina Camacho is an 82 year old white female with a history of some difficulty with memory mainly manifested as word finding problems beginning about a year ago. The patient does report some general short-term memory issues as well. She still operates motor vehicle, but she only drives short distances, and she has not had any difficulty with this activity. The patient has no problems with keeping up with appointments or with medications. She does the finances, she has not had any errors with this. She lives at [facility], she does not do any cooking therefore. She denies any numbness or weakness of the extremities, she denies any balance issues. She reports that she is also having difficulty with spelling as well as with word finding. She denies any issues with reading or difficulty understanding what is being said to her. She is sent to this office for an evaluation.  Past Medical History  Diagnosis Date  . Hypertension   . Hypothyroidism   . History of colonic polyps   . Kidney stone     pt denies 03/17/14  . Diverticulosis   . History of hysterectomy   . Barrett's esophagus   . GERD (gastroesophageal reflux disease)   . Esophageal stricture   . Internal hemorrhoids   . Memory difficulty 10/17/2015    Past Surgical History  Procedure Laterality Date  . Appendectomy      with hysterectomy  . Tonsillectomy    . Thyroidectomy, partial      Family History  Problem Relation Age of Onset  . Stroke Brother   . Emphysema Father   . Colon cancer Neg Hx   . Heart attack Mother   . Dementia Mother   . Dementia Sister   . Cancer Sister     Social history:  reports that she quit smoking about 28 years ago. Her smoking use included Cigarettes. She started smoking about 67 years ago. She has a 38 pack-year smoking history. She has never  used smokeless tobacco. She reports that she drinks about 4.2 oz of alcohol per week. She reports that she does not use illicit drugs.  Medications:  Prior to Admission medications   Medication Sig Start Date End Date Taking? Authorizing Provider  atenolol (TENORMIN) 25 MG tablet Take 25 mg by mouth daily.  01/12/14  Yes Historical Provider, MD  Calcium 600-200 MG-UNIT per tablet Take 1 tablet by mouth 3 (three) times daily with meals.   Yes Historical Provider, MD  cholecalciferol (VITAMIN D) 1000 UNITS tablet Take 1,000 Units by mouth 3 (three) times daily.   Yes Historical Provider, MD  fexofenadine (ALLEGRA) 180 MG tablet Take 180 mg by mouth daily.   Yes Historical Provider, MD  levothyroxine (SYNTHROID, LEVOTHROID) 50 MCG tablet Take by mouth daily. 50mg  Monday thru Friday. Saturday and Sunday 25mg  (half tablet) 05/29/12  Yes Historical Provider, MD  losartan (COZAAR) 100 MG tablet Take 100 mg by mouth daily.   Yes Historical Provider, MD  Multiple Vitamins-Minerals (ICAPS MV) TABS Take 2 tablets by mouth 2 (two) times daily.    Yes Historical Provider, MD  omeprazole (PRILOSEC) 20 MG capsule Take 20 mg by mouth daily.  05/29/12  Yes Historical Provider, MD      Allergies  Allergen Reactions  . Penicillins Nausea Only    ROS:  Out of a complete 14 system  review of symptoms, the patient complains only of the following symptoms, and all other reviewed systems are negative.  Blurred vision, decreased vision Memory loss Snoring  Blood pressure 160/67, pulse 59, height 4' 11.5" (1.511 m), weight 108 lb 8 oz (49.215 kg).  Physical Exam  General: The patient is alert and cooperative at the time of the examination.  Eyes: Pupils are equal, round, and reactive to light. Discs are flat bilaterally.  Neck: The neck is supple, no carotid bruits are noted.  Respiratory: The respiratory examination is clear.  Cardiovascular: The cardiovascular examination reveals a regular rate and  rhythm, no obvious murmurs or rubs are noted.  Skin: Extremities are without significant edema.  Neurologic Exam  Mental status: The patient is alert and oriented x 3 at the time of the examination. The patient has apparent normal recent and remote memory, with an apparently normal attention span and concentration ability. Mini-Mental Status Examination done today shows a total score 29/30. The patient is able to name 10 animals in 30 seconds.  Cranial nerves: Facial symmetry is present. There is good sensation of the face to pinprick and soft touch bilaterally. The strength of the facial muscles and the muscles to head turning and shoulder shrug are normal bilaterally. Speech is well enunciated, but the patient clearly has some issues with word finding on a frequent basis. Extraocular movements are full. Visual fields are full. The tongue is midline, and the patient has symmetric elevation of the soft palate. No obvious hearing deficits are noted.  Motor: The motor testing reveals 5 over 5 strength of all 4 extremities. Good symmetric motor tone is noted throughout.  Sensory: Sensory testing is intact to pinprick, soft touch, vibration sensation, and position sense on all 4 extremities. No evidence of extinction is noted.  Coordination: Cerebellar testing reveals good finger-nose-finger and heel-to-shin bilaterally.  Gait and station: Gait is normal. Tandem gait is normal. Romberg is negative. No drift is seen.  Reflexes: Deep tendon reflexes are symmetric and normal bilaterally. Toes are downgoing bilaterally.   Assessment/Plan:  1. Mild memory disturbance, word finding difficulty  The patient does have prominent word finding issues to the point where it affects her fluidity of speech. The patient may be developing an Alzheimer's process. Further evaluation will be done to include blood work, and MRI of the brain. Will discuss adding medication such as Aricept or Namenda in the future. The  patient will follow-up in 6 months.  Christina Alexanders MD 10/17/2015 7:12 PM  [facility] [address] [place], [place] [address]  Phone [phone] Fax [phone]

## 2015-10-17 NOTE — Patient Instructions (Signed)
    WE will check blood work today and get MRI of the brain. 

## 2015-10-19 LAB — COPPER, SERUM: Copper: 104 ug/dL (ref 72–166)

## 2015-10-19 LAB — VITAMIN B12: VITAMIN B 12: 909 pg/mL (ref 211–946)

## 2015-10-19 LAB — RPR: RPR Ser Ql: NONREACTIVE

## 2015-11-07 ENCOUNTER — Ambulatory Visit
Admission: RE | Admit: 2015-11-07 | Discharge: 2015-11-07 | Disposition: A | Discharge status: Home / Self Care | Payer: PPO | Source: Ambulatory Visit | Attending: Neurology | Admitting: Neurology

## 2015-11-07 DIAGNOSIS — R413 Other amnesia: Secondary | ICD-10-CM | POA: Diagnosis not present

## 2015-11-08 ENCOUNTER — Telehealth: Payer: Self-pay | Admitting: Neurology

## 2015-11-08 MED ORDER — DONEPEZIL HCL 5 MG PO TABS: 5.0000 mg | ORAL_TABLET | Freq: Every day | ORAL | Status: DC

## 2015-11-08 NOTE — Telephone Encounter (Signed)
I called patient. MRI the brain shows some atrophy and mild small vessel disease. The patient likely has Alzheimer's process, we will consider starting Aricept, the patient is amenable to this, I have called in a prescription.   MRI brain 11/08/15:  IMPRESSION:  Abnormal MRI brain (without) demonstrating: 1. Mild diffuse, moderate perisylvian and severe mesial temporal atrophy. 2. Mild periventricular and subcortical chronic small vessel ischemic disease. 3. No acute findings.

## 2015-11-12 ENCOUNTER — Other Ambulatory Visit: Payer: Self-pay

## 2015-11-12 DIAGNOSIS — Z1231 Encounter for screening mammogram for malignant neoplasm of breast: Secondary | ICD-10-CM

## 2015-11-13 ENCOUNTER — Telehealth: Payer: Self-pay | Admitting: Neurology

## 2015-11-13 NOTE — Telephone Encounter (Signed)
Patient states that she has had diarrhea and nausea since starting Donepezil 5mg . But nurse states that patient is running a low grade fever and the stomach flu is going around the [facility] home. Do you want her to stop it for a few days? Or what would you recommend?

## 2015-11-13 NOTE — Telephone Encounter (Signed)
Patient and husband called to advise, since taking donepezil (ARICEPT) 5 MG tablet, has been very nauseated, not sure if it's a side effect of the medication or if it is the flu that is going around the [facility] where they live, nurse at retirement center says that she is running a low grade fever of 99.9. Patient needs to know if she should continue to take the medication. Please call [phone].

## 2015-11-13 NOTE — Telephone Encounter (Signed)
I called the patient. The patient has had fever, nausea vomiting, and diarrhea. This likely is a stomach virus, I would hold off on taking the Aricept until she feels normal, restart the medication at that time. If the symptoms recur, she is to stop the medication.

## 2015-11-23 ENCOUNTER — Ambulatory Visit
Admission: RE | Admit: 2015-11-23 | Discharge: 2015-11-23 | Disposition: A | Discharge status: Home / Self Care | Payer: PPO | Source: Ambulatory Visit

## 2015-11-23 DIAGNOSIS — Z1231 Encounter for screening mammogram for malignant neoplasm of breast: Secondary | ICD-10-CM

## 2015-12-24 DIAGNOSIS — Z961 Presence of intraocular lens: Secondary | ICD-10-CM | POA: Diagnosis not present

## 2015-12-24 DIAGNOSIS — H353122 Nonexudative age-related macular degeneration, left eye, intermediate dry stage: Secondary | ICD-10-CM | POA: Diagnosis not present

## 2015-12-24 DIAGNOSIS — H353212 Exudative age-related macular degeneration, right eye, with inactive choroidal neovascularization: Secondary | ICD-10-CM | POA: Diagnosis not present

## 2015-12-24 DIAGNOSIS — H52203 Unspecified astigmatism, bilateral: Secondary | ICD-10-CM | POA: Diagnosis not present

## 2016-01-02 ENCOUNTER — Other Ambulatory Visit: Payer: Self-pay | Admitting: Neurology

## 2016-01-03 DIAGNOSIS — L400 Psoriasis vulgaris: Secondary | ICD-10-CM | POA: Diagnosis not present

## 2016-01-03 DIAGNOSIS — Z85828 Personal history of other malignant neoplasm of skin: Secondary | ICD-10-CM | POA: Diagnosis not present

## 2016-03-21 DIAGNOSIS — M5416 Radiculopathy, lumbar region: Secondary | ICD-10-CM | POA: Diagnosis not present

## 2016-03-21 DIAGNOSIS — R413 Other amnesia: Secondary | ICD-10-CM | POA: Diagnosis not present

## 2016-03-21 DIAGNOSIS — Z6821 Body mass index (BMI) 21.0-21.9, adult: Secondary | ICD-10-CM | POA: Diagnosis not present

## 2016-03-21 DIAGNOSIS — H8110 Benign paroxysmal vertigo, unspecified ear: Secondary | ICD-10-CM | POA: Diagnosis not present

## 2016-03-21 DIAGNOSIS — J302 Other seasonal allergic rhinitis: Secondary | ICD-10-CM | POA: Diagnosis not present

## 2016-03-21 DIAGNOSIS — E038 Other specified hypothyroidism: Secondary | ICD-10-CM | POA: Diagnosis not present

## 2016-03-21 DIAGNOSIS — K2270 Barrett's esophagus without dysplasia: Secondary | ICD-10-CM | POA: Diagnosis not present

## 2016-03-21 DIAGNOSIS — M859 Disorder of bone density and structure, unspecified: Secondary | ICD-10-CM | POA: Diagnosis not present

## 2016-03-21 DIAGNOSIS — K219 Gastro-esophageal reflux disease without esophagitis: Secondary | ICD-10-CM | POA: Diagnosis not present

## 2016-03-21 DIAGNOSIS — I10 Essential (primary) hypertension: Secondary | ICD-10-CM | POA: Diagnosis not present

## 2016-04-16 ENCOUNTER — Ambulatory Visit: Payer: PPO | Admitting: Neurology

## 2016-04-16 DIAGNOSIS — D1801 Hemangioma of skin and subcutaneous tissue: Secondary | ICD-10-CM | POA: Diagnosis not present

## 2016-04-16 DIAGNOSIS — L400 Psoriasis vulgaris: Secondary | ICD-10-CM | POA: Diagnosis not present

## 2016-04-16 DIAGNOSIS — L821 Other seborrheic keratosis: Secondary | ICD-10-CM | POA: Diagnosis not present

## 2016-04-16 DIAGNOSIS — L570 Actinic keratosis: Secondary | ICD-10-CM | POA: Diagnosis not present

## 2016-04-16 DIAGNOSIS — L814 Other melanin hyperpigmentation: Secondary | ICD-10-CM | POA: Diagnosis not present

## 2016-04-16 DIAGNOSIS — Z85828 Personal history of other malignant neoplasm of skin: Secondary | ICD-10-CM | POA: Diagnosis not present

## 2016-04-18 ENCOUNTER — Encounter: Payer: Self-pay | Admitting: Neurology

## 2016-04-18 ENCOUNTER — Ambulatory Visit (INDEPENDENT_AMBULATORY_CARE_PROVIDER_SITE_OTHER): Payer: PPO | Admitting: Neurology

## 2016-04-18 VITALS — BP 186/70 | HR 55 | Ht 59.5 in | Wt 109.0 lb

## 2016-04-18 DIAGNOSIS — R413 Other amnesia: Secondary | ICD-10-CM | POA: Diagnosis not present

## 2016-04-18 NOTE — Progress Notes (Signed)
Reason for visit: Memory disorder  Christina Camacho is an 82 y.o. female  History of present illness:  Christina Camacho is an 82 year old right-handed white female with a history of a gradually progressive memory disorder. The patient does have a general memory deficit, but her primary deficits are verbal. The patient has significant word finding problems to the point where her speech is becoming nonfluent. The patient has been placed on Aricept, she is tolerating the 5 mg dose well. She has not had much progression in her memory problem since last seen. She denies that she has much trouble understanding speech, but generating speech is more difficult. She returns for an evaluation.  Past Medical History:  Diagnosis Date  . Barrett's esophagus   . Diverticulosis   . Esophageal stricture   . GERD (gastroesophageal reflux disease)   . History of colonic polyps   . History of hysterectomy   . Hypertension   . Hypothyroidism   . Internal hemorrhoids   . Kidney stone    pt denies 03/17/14  . Memory difficulty 10/17/2015    Past Surgical History:  Procedure Laterality Date  . APPENDECTOMY     with hysterectomy  . THYROIDECTOMY, PARTIAL    . TONSILLECTOMY      Family History  Problem Relation Age of Onset  . Stroke Brother   . Emphysema Father   . Heart attack Mother   . Dementia Mother   . Dementia Sister   . Cancer Sister   . Colon cancer Neg Hx     Social history:  reports that she quit smoking about 28 years ago. Her smoking use included Cigarettes. She started smoking about 67 years ago. She has a 38.00 pack-year smoking history. She has never used smokeless tobacco. She reports that she drinks about 4.2 oz of alcohol per week . She reports that she does not use drugs.    Allergies  Allergen Reactions  . Penicillins Nausea Only    Medications:  Prior to Admission medications   Medication Sig Start Date End Date Taking? Authorizing Provider  Calcium 600-200  MG-UNIT per tablet Take 1 tablet by mouth 3 (three) times daily with meals.   Yes Historical Provider, Camacho  cholecalciferol (VITAMIN D) 1000 UNITS tablet Take 1,000 Units by mouth 3 (three) times daily.   Yes Historical Provider, Camacho  donepezil (ARICEPT) 5 MG tablet TAKE 1 TABLET (5 MG TOTAL) BY MOUTH AT BEDTIME. 01/07/16  Yes Kathrynn Ducking, Camacho  fexofenadine (ALLEGRA) 180 MG tablet Take 180 mg by mouth daily.   Yes Historical Provider, Camacho  levothyroxine (SYNTHROID, LEVOTHROID) 50 MCG tablet Take by mouth daily. 50mg  Monday thru Friday. Saturday and Sunday 25mg  (half tablet) 05/29/12  Yes Historical Provider, Camacho  losartan (COZAAR) 100 MG tablet Take 100 mg by mouth daily.   Yes Historical Provider, Camacho  Multiple Vitamins-Minerals (ICAPS MV) TABS Take 2 tablets by mouth 2 (two) times daily.    Yes Historical Provider, Camacho  omeprazole (PRILOSEC) 20 MG capsule Take 20 mg by mouth daily.  05/29/12  Yes Historical Provider, Camacho  atenolol (TENORMIN) 50 MG tablet Take 25 mg by mouth daily. 03/28/16   Historical Provider, Camacho  betamethasone dipropionate (DIPROLENE) 0.05 % cream  04/16/16   Historical Provider, Camacho    ROS:  Out of a complete 14 system review of symptoms, the patient complains only of the following symptoms, and all other reviewed systems are negative.  Runny nose Snoring  Blood pressure Marland Kitchen)  186/70, pulse (!) 55, height 4' 11.5" (1.511 m), weight 109 lb (49.4 kg).  Physical Exam  General: The patient is alert and cooperative at the time of the examination.  Skin: No significant peripheral edema is noted.   Neurologic Exam  Mental status: The patient is alert and oriented x 3 at the time of the examination. The patient has apparent normal recent and remote memory, with an apparently normal attention span and concentration ability. Mini-Mental Status Examination done today shows a total score of 29/30.   Cranial nerves: Facial symmetry is present. Speech is slightly nonfluent, word  finding problems are noted. Extraocular movements are full. Visual fields are full.  Motor: The patient has good strength in all 4 extremities.  Sensory examination: Soft touch sensation is symmetric on the face, arms, and legs.  Coordination: The patient has good finger-nose-finger and heel-to-shin bilaterally.  Gait and station: The patient has a normal gait. Tandem gait is normal. Romberg is negative. No drift is seen.  Reflexes: Deep tendon reflexes are symmetric.   Assessment/Plan:  1. Memory disorder  The patient has a clinical course that simulates a primary progressive aphasia, but she is reporting some memory issues. The process likely represents an Alzheimer's disease with primarily aphasia symptoms. The patient will increase the Aricept taking 10 mg at night. She will follow-up in 8 months. She will contact me if she is having tolerance issues on the medication.  Christina Camacho 04/18/2016 10:15 AM  [facility] [address] [place], [place] [address]  Phone [phone] Fax [phone]

## 2016-05-01 ENCOUNTER — Telehealth: Payer: Self-pay | Admitting: Neurology

## 2016-05-01 NOTE — Telephone Encounter (Signed)
Returned pt TC but no answer. Left mssg to return call to discuss further.

## 2016-05-01 NOTE — Telephone Encounter (Addendum)
Patient called requesting to speak to nurse or Dr. Jannifer Franklin regarding increasing donepezil (ARICEPT) 5 MG tablet, requests that nurse call back early this afternoon, "has appointment at 11:00".

## 2016-05-02 MED ORDER — DONEPEZIL HCL 10 MG PO TABS: 10.0000 mg | ORAL_TABLET | Freq: Every day | ORAL | 3 refills | Status: DC

## 2016-05-02 NOTE — Telephone Encounter (Signed)
Spoke to pt. Reports that she increased donepezil to 10 mg, taking two 5 mg tabs per day. Asked if new rx for 10 mg tabs could be called into [facility]. She then went on to say that she has had a runny nose since increasing dose of med and wondered if it could be a side effect. Let her know that runny nose is a common side effect but may resolve after several weeks of taking the medication. She does not remember having nasal drainage/stuffiness when starting 5 mg. Would like Dr. Jannifer Franklin' advice on whether to continue 10 mg or go back to 5 mg per day.

## 2016-05-02 NOTE — Addendum Note (Signed)
Addended by: Margette Fast on: 05/02/2016 08:38 AM   Modules accepted: Orders

## 2016-05-02 NOTE — Telephone Encounter (Signed)
I called patient. The patient is tolerating Aricept other than she is getting a runny nose from medication. I have called in a 10 mg tablet, if the patient also with finds that she just cannot tolerate the side effect, we can lower the dose to 5 mg.

## 2016-06-07 DIAGNOSIS — Z23 Encounter for immunization: Secondary | ICD-10-CM | POA: Diagnosis not present

## 2016-08-16 ENCOUNTER — Other Ambulatory Visit: Payer: Self-pay | Admitting: Neurology

## 2016-09-18 DIAGNOSIS — M859 Disorder of bone density and structure, unspecified: Secondary | ICD-10-CM | POA: Diagnosis not present

## 2016-09-18 DIAGNOSIS — I10 Essential (primary) hypertension: Secondary | ICD-10-CM | POA: Diagnosis not present

## 2016-09-18 DIAGNOSIS — E038 Other specified hypothyroidism: Secondary | ICD-10-CM | POA: Diagnosis not present

## 2016-09-29 DIAGNOSIS — K219 Gastro-esophageal reflux disease without esophagitis: Secondary | ICD-10-CM | POA: Diagnosis not present

## 2016-09-29 DIAGNOSIS — R413 Other amnesia: Secondary | ICD-10-CM | POA: Diagnosis not present

## 2016-09-29 DIAGNOSIS — K2270 Barrett's esophagus without dysplasia: Secondary | ICD-10-CM | POA: Diagnosis not present

## 2016-09-29 DIAGNOSIS — E038 Other specified hypothyroidism: Secondary | ICD-10-CM | POA: Diagnosis not present

## 2016-09-29 DIAGNOSIS — Z1389 Encounter for screening for other disorder: Secondary | ICD-10-CM | POA: Diagnosis not present

## 2016-09-29 DIAGNOSIS — H8110 Benign paroxysmal vertigo, unspecified ear: Secondary | ICD-10-CM | POA: Diagnosis not present

## 2016-09-29 DIAGNOSIS — M5416 Radiculopathy, lumbar region: Secondary | ICD-10-CM | POA: Diagnosis not present

## 2016-09-29 DIAGNOSIS — J302 Other seasonal allergic rhinitis: Secondary | ICD-10-CM | POA: Diagnosis not present

## 2016-09-29 DIAGNOSIS — M859 Disorder of bone density and structure, unspecified: Secondary | ICD-10-CM | POA: Diagnosis not present

## 2016-09-29 DIAGNOSIS — I10 Essential (primary) hypertension: Secondary | ICD-10-CM | POA: Diagnosis not present

## 2016-09-29 DIAGNOSIS — R8299 Other abnormal findings in urine: Secondary | ICD-10-CM | POA: Diagnosis not present

## 2016-09-29 DIAGNOSIS — Z6821 Body mass index (BMI) 21.0-21.9, adult: Secondary | ICD-10-CM | POA: Diagnosis not present

## 2016-09-29 DIAGNOSIS — Z0000 Encounter for general adult medical examination without abnormal findings: Secondary | ICD-10-CM | POA: Diagnosis not present

## 2016-10-02 DIAGNOSIS — H353122 Nonexudative age-related macular degeneration, left eye, intermediate dry stage: Secondary | ICD-10-CM | POA: Diagnosis not present

## 2016-10-02 DIAGNOSIS — H353212 Exudative age-related macular degeneration, right eye, with inactive choroidal neovascularization: Secondary | ICD-10-CM | POA: Diagnosis not present

## 2016-10-02 DIAGNOSIS — Z961 Presence of intraocular lens: Secondary | ICD-10-CM | POA: Diagnosis not present

## 2016-10-02 DIAGNOSIS — I10 Essential (primary) hypertension: Secondary | ICD-10-CM | POA: Diagnosis not present

## 2016-10-03 DIAGNOSIS — Z1212 Encounter for screening for malignant neoplasm of rectum: Secondary | ICD-10-CM | POA: Diagnosis not present

## 2016-12-17 ENCOUNTER — Ambulatory Visit: Payer: PPO | Admitting: Neurology

## 2017-01-08 ENCOUNTER — Encounter (INDEPENDENT_AMBULATORY_CARE_PROVIDER_SITE_OTHER): Payer: Self-pay

## 2017-01-08 ENCOUNTER — Encounter: Payer: Self-pay | Admitting: Neurology

## 2017-01-08 ENCOUNTER — Ambulatory Visit (INDEPENDENT_AMBULATORY_CARE_PROVIDER_SITE_OTHER): Payer: PPO | Admitting: Neurology

## 2017-01-08 VITALS — BP 155/59 | HR 56 | Wt 108.5 lb

## 2017-01-08 DIAGNOSIS — R413 Other amnesia: Secondary | ICD-10-CM | POA: Diagnosis not present

## 2017-01-08 MED ORDER — DONEPEZIL HCL 10 MG PO TABS: 10.0000 mg | ORAL_TABLET | Freq: Every day | ORAL | 3 refills | Status: DC

## 2017-01-08 NOTE — Progress Notes (Signed)
Reason for visit: Aphasia  Christina Camacho is an 83 y.o. female  History of present illness:  Christina Camacho is an 83 year old right-handed white female with a history of a progressive aphasia syndrome. The patient is having a mild problem with memory, she is felt to have an Alzheimer's disease variant that mimics a primary progressive aphasia. The patient is on Aricept taking 10 mg at night. She has not had a significant change in her cognitive capacity since last seen. She is having a runny nose on the medication. The patient continues to do crossword puzzles and cryptoqutes on a regular basis without difficulty. The patient indicates that she has no problem understanding what she is reading and she can understand what people are telling her. The patient will have good days and bad days when it comes to memory and language. She returns to the office today for an evaluation.  Past Medical History:  Diagnosis Date  . Barrett's esophagus   . Diverticulosis   . Esophageal stricture   . GERD (gastroesophageal reflux disease)   . History of colonic polyps   . History of hysterectomy   . Hypertension   . Hypothyroidism   . Internal hemorrhoids   . Kidney stone    pt denies 03/17/14  . Memory difficulty 10/17/2015    Past Surgical History:  Procedure Laterality Date  . APPENDECTOMY     with hysterectomy  . THYROIDECTOMY, PARTIAL    . TONSILLECTOMY      Family History  Problem Relation Age of Onset  . Stroke Brother   . Emphysema Father   . Heart attack Mother   . Dementia Mother   . Dementia Sister   . Cancer Sister   . Colon cancer Neg Hx     Social history:  reports that she quit smoking about 29 years ago. Her smoking use included Cigarettes. She started smoking about 68 years ago. She has a 38.00 pack-year smoking history. She has never used smokeless tobacco. She reports that she drinks about 4.2 oz of alcohol per week . She reports that she does not use drugs.      Allergies  Allergen Reactions  . Penicillins Nausea Only    Medications:  Prior to Admission medications   Medication Sig Start Date End Date Taking? Authorizing Provider  atenolol (TENORMIN) 50 MG tablet Take 25 mg by mouth daily. 03/28/16  Yes [provider]  Calcium 600-200 MG-UNIT per tablet Take 1 tablet by mouth 3 (three) times daily with meals.   Yes [provider]  cholecalciferol (VITAMIN D) 1000 UNITS tablet Take 1,000 Units by mouth 3 (three) times daily.   Yes [provider]  donepezil (ARICEPT) 10 MG tablet TAKE 1 TABLET (10 MG TOTAL) BY MOUTH AT BEDTIME. 08/20/16  Yes Kathrynn Ducking, MD  fexofenadine (ALLEGRA) 180 MG tablet Take 180 mg by mouth daily.   Yes [provider]  levothyroxine (SYNTHROID, LEVOTHROID) 50 MCG tablet Take by mouth daily. 50mg  Monday thru Friday. Saturday and Sunday 25mg  (half tablet) 05/29/12  Yes [provider]  losartan (COZAAR) 100 MG tablet Take 100 mg by mouth daily.   Yes [provider]  Multiple Vitamins-Minerals (ICAPS MV) TABS Take 2 tablets by mouth 2 (two) times daily.    Yes [provider]  omeprazole (PRILOSEC) 20 MG capsule Take 20 mg by mouth daily.  05/29/12  Yes [provider]  betamethasone dipropionate (DIPROLENE) 0.05 % cream  04/16/16  [provider]    ROS:  Out of a complete 14 system review of symptoms, the patient complains only of the following symptoms, and all other reviewed systems are negative.  Runny nose Cough Leg cramps Snoring Headache, speech difficulty  Blood pressure (!) 155/59, pulse (!) 56, weight 108 lb 8 oz (49.2 kg).  Physical Exam  General: The patient is alert and cooperative at the time of the examination.  Skin: No significant peripheral edema is noted.   Neurologic Exam  Mental status: The patient is alert and oriented x 3 at the time of the examination. The Mini-Mental Status Examination done today  shows a total score of 21/30. The patient is able to name 5 animals in 1 minute.   Cranial nerves: Facial symmetry is present. Speech is dysfluent, aphasic. Extraocular movements are full. Visual fields are full.  Motor: The patient has good strength in all 4 extremities.  Sensory examination: Soft touch sensation is symmetric on the face, arms, and legs.  Coordination: The patient has good finger-nose-finger and heel-to-shin bilaterally. The patient does appear to have some apraxia with the use of the extremities.  Gait and station: The patient has a normal gait. Tandem gait is unsteady. Romberg is negative. No drift is seen.  Reflexes: Deep tendon reflexes are symmetric.   Assessment/Plan:  1. Progressive aphasia  The patient likely has Alzheimer's disease variant of a primary progressive aphasia. The patient will remain on Aricept. She does not wish to go on Namenda at this time, she will be given a trial on coconut oil taking at least 4 ounces a day. The patient will follow-up in 6 months.  [name] 01/08/2017 3:56 PM  [facility] [address] [place], [place] [address]  Phone [phone] Fax [phone]

## 2017-01-09 DIAGNOSIS — H353122 Nonexudative age-related macular degeneration, left eye, intermediate dry stage: Secondary | ICD-10-CM | POA: Diagnosis not present

## 2017-01-09 DIAGNOSIS — Z961 Presence of intraocular lens: Secondary | ICD-10-CM | POA: Diagnosis not present

## 2017-01-09 DIAGNOSIS — H52203 Unspecified astigmatism, bilateral: Secondary | ICD-10-CM | POA: Diagnosis not present

## 2017-01-20 ENCOUNTER — Other Ambulatory Visit: Payer: Self-pay | Admitting: Internal Medicine

## 2017-01-20 DIAGNOSIS — Z1231 Encounter for screening mammogram for malignant neoplasm of breast: Secondary | ICD-10-CM

## 2017-02-02 ENCOUNTER — Ambulatory Visit
Admission: RE | Admit: 2017-02-02 | Discharge: 2017-02-02 | Disposition: A | Discharge status: Home / Self Care | Payer: PPO | Source: Ambulatory Visit | Attending: Internal Medicine | Admitting: Internal Medicine

## 2017-02-02 DIAGNOSIS — Z1231 Encounter for screening mammogram for malignant neoplasm of breast: Secondary | ICD-10-CM | POA: Diagnosis not present

## 2017-02-04 DIAGNOSIS — H903 Sensorineural hearing loss, bilateral: Secondary | ICD-10-CM | POA: Diagnosis not present

## 2017-02-20 ENCOUNTER — Ambulatory Visit: Payer: PPO | Admitting: Neurology

## 2017-03-23 DIAGNOSIS — M722 Plantar fascial fibromatosis: Secondary | ICD-10-CM | POA: Diagnosis not present

## 2017-03-23 DIAGNOSIS — M71572 Other bursitis, not elsewhere classified, left ankle and foot: Secondary | ICD-10-CM | POA: Diagnosis not present

## 2017-03-23 DIAGNOSIS — M7662 Achilles tendinitis, left leg: Secondary | ICD-10-CM | POA: Diagnosis not present

## 2017-03-23 DIAGNOSIS — M79672 Pain in left foot: Secondary | ICD-10-CM | POA: Diagnosis not present

## 2017-04-01 DIAGNOSIS — M5416 Radiculopathy, lumbar region: Secondary | ICD-10-CM | POA: Diagnosis not present

## 2017-04-01 DIAGNOSIS — K2270 Barrett's esophagus without dysplasia: Secondary | ICD-10-CM | POA: Diagnosis not present

## 2017-04-01 DIAGNOSIS — Z6821 Body mass index (BMI) 21.0-21.9, adult: Secondary | ICD-10-CM | POA: Diagnosis not present

## 2017-04-01 DIAGNOSIS — K219 Gastro-esophageal reflux disease without esophagitis: Secondary | ICD-10-CM | POA: Diagnosis not present

## 2017-04-01 DIAGNOSIS — R413 Other amnesia: Secondary | ICD-10-CM | POA: Diagnosis not present

## 2017-04-01 DIAGNOSIS — E038 Other specified hypothyroidism: Secondary | ICD-10-CM | POA: Diagnosis not present

## 2017-04-01 DIAGNOSIS — K625 Hemorrhage of anus and rectum: Secondary | ICD-10-CM | POA: Diagnosis not present

## 2017-04-01 DIAGNOSIS — M859 Disorder of bone density and structure, unspecified: Secondary | ICD-10-CM | POA: Diagnosis not present

## 2017-04-01 DIAGNOSIS — I10 Essential (primary) hypertension: Secondary | ICD-10-CM | POA: Diagnosis not present

## 2017-04-01 DIAGNOSIS — H8110 Benign paroxysmal vertigo, unspecified ear: Secondary | ICD-10-CM | POA: Diagnosis not present

## 2017-04-06 DIAGNOSIS — L303 Infective dermatitis: Secondary | ICD-10-CM | POA: Diagnosis not present

## 2017-04-06 DIAGNOSIS — M71572 Other bursitis, not elsewhere classified, left ankle and foot: Secondary | ICD-10-CM | POA: Diagnosis not present

## 2017-04-15 DIAGNOSIS — L570 Actinic keratosis: Secondary | ICD-10-CM | POA: Diagnosis not present

## 2017-04-15 DIAGNOSIS — Z85828 Personal history of other malignant neoplasm of skin: Secondary | ICD-10-CM | POA: Diagnosis not present

## 2017-04-15 DIAGNOSIS — D1801 Hemangioma of skin and subcutaneous tissue: Secondary | ICD-10-CM | POA: Diagnosis not present

## 2017-04-15 DIAGNOSIS — L308 Other specified dermatitis: Secondary | ICD-10-CM | POA: Diagnosis not present

## 2017-04-15 DIAGNOSIS — L720 Epidermal cyst: Secondary | ICD-10-CM | POA: Diagnosis not present

## 2017-04-15 DIAGNOSIS — C44319 Basal cell carcinoma of skin of other parts of face: Secondary | ICD-10-CM | POA: Diagnosis not present

## 2017-04-15 DIAGNOSIS — D485 Neoplasm of uncertain behavior of skin: Secondary | ICD-10-CM | POA: Diagnosis not present

## 2017-04-15 DIAGNOSIS — L814 Other melanin hyperpigmentation: Secondary | ICD-10-CM | POA: Diagnosis not present

## 2017-04-15 DIAGNOSIS — L821 Other seborrheic keratosis: Secondary | ICD-10-CM | POA: Diagnosis not present

## 2017-04-16 ENCOUNTER — Encounter (INDEPENDENT_AMBULATORY_CARE_PROVIDER_SITE_OTHER): Payer: Self-pay

## 2017-04-16 ENCOUNTER — Ambulatory Visit (INDEPENDENT_AMBULATORY_CARE_PROVIDER_SITE_OTHER): Payer: PPO | Admitting: Physician Assistant

## 2017-04-16 ENCOUNTER — Encounter: Payer: Self-pay | Admitting: Physician Assistant

## 2017-04-16 VITALS — BP 110/70 | HR 56 | Ht 59.5 in | Wt 108.0 lb

## 2017-04-16 DIAGNOSIS — R194 Change in bowel habit: Secondary | ICD-10-CM | POA: Diagnosis not present

## 2017-04-16 DIAGNOSIS — K625 Hemorrhage of anus and rectum: Secondary | ICD-10-CM | POA: Diagnosis not present

## 2017-04-16 DIAGNOSIS — K626 Ulcer of anus and rectum: Secondary | ICD-10-CM | POA: Diagnosis not present

## 2017-04-16 MED ORDER — HYDROCORTISONE ACETATE 25 MG RE SUPP: RECTAL | 2 refills | Status: DC

## 2017-04-16 NOTE — Patient Instructions (Signed)
We will call you with an appointment with a Gynecologist.   Anusol HC suppositories, use 1 suppository at bedtime.  If these are too expensive, you can get Preperation H suppositories.

## 2017-04-16 NOTE — Progress Notes (Signed)
Subjective:    Patient ID: Christina Camacho, female    DOB: 1928/12/24, 83 y.o.   MRN: [id number]  HPI Christina Camacho is a pleasant 83 year old white female, known to Dr. Henrene Pastor, last seen in our office in 2015 he is referred today by Dr. Reynaldo Minium with complaints of rectal bleeding and difficulty evacuating her bowel. Patient has history of short segment Barrett's and had undergone serial endoscopies. The last was done in September 2015 again with short segment Barrett's. Biopsies did not show any dysplasia, and due to her advanced age no further surveillance planned. She had colonoscopy in 2010 per Dr. Lajoyce Corners, for follow-up of polyps. She did not have any polyps, was noted to have internal hemorrhoids and a very thickened tortuous sigmoid colon resulting in a very difficult exam. Colonoscopy also done in 2004 noting diverticulosis, no polyps Patient has history of dementia, recently had a skin cancer removed from her face, and hypertension. She says that she first noticed some blood a couple of weeks ago which was bright red and on the tissue after a bowel movement. This happened for several days and then she had a period of time where she passed what seemed to be more bright red blood again only with bowel movements and just on the tissue. This week she has not seen any blood. She also feels that she's recently had a lot of straining necessary and though her stools are not hard they seem to be difficult to pass. She is having bowel movements on a daily basis, but feels the need to go to the bathroom for bowel movement more frequently. She denies any abdominal pain, appetite has been fine, no recent weight loss.  Review of Systems Pertinent positive and negative review of systems were noted in the above HPI section.  All other review of systems was otherwise negative.  Outpatient Encounter Prescriptions as of 04/16/2017  Medication Sig  . atenolol (TENORMIN) 50 MG tablet Take 25 mg by mouth daily.  .  betamethasone dipropionate (DIPROLENE) 0.05 % cream   . cholecalciferol (VITAMIN D) 1000 UNITS tablet Take 1,000 Units by mouth 3 (three) times daily.  Marland Kitchen donepezil (ARICEPT) 10 MG tablet Take 1 tablet (10 mg total) by mouth at bedtime.  Marland Kitchen levothyroxine (SYNTHROID, LEVOTHROID) 50 MCG tablet Take by mouth daily. 50mg  Monday thru Friday. Saturday and Sunday 25mg  (half tablet)  . losartan (COZAAR) 100 MG tablet Take 100 mg by mouth daily.  . Multiple Vitamins-Minerals (ICAPS MV) TABS Take 2 tablets by mouth 2 (two) times daily.   Marland Kitchen omeprazole (PRILOSEC) 20 MG capsule Take 20 mg by mouth daily.   . hydrocortisone (ANUSOL-HC) 25 MG suppository Use 1 suppository at bedtime.  . [DISCONTINUED] Calcium 600-200 MG-UNIT per tablet Take 1 tablet by mouth 3 (three) times daily with meals.  . [DISCONTINUED] fexofenadine (ALLEGRA) 180 MG tablet Take 180 mg by mouth daily.   No facility-administered encounter medications on file as of 04/16/2017.    Allergies  Allergen Reactions  . Penicillins Nausea Only   Patient Active Problem List   Diagnosis Date Noted  . Memory difficulty 10/17/2015  . Chest pain 08/10/2012  . HTN (hypertension) 08/10/2012  . ESOPHAGEAL STRICTURE 06/08/2009  . GERD 06/08/2009  . BARRETT'S ESOPHAGUS 06/08/2009  . DYSPHAGIA 04/18/2009  . ABDOMINAL PAIN, EPIGASTRIC 04/18/2009  . NONSPECIFIC ABN FINDING RAD & OTH EXAM GI TRACT 04/18/2009   Social History   Social History  . Marital status: Married    Spouse name: Vern  .  Number of children: 1  . Years of education: masters   Occupational History  . retired    Social History Main Topics  . Smoking status: Former Smoker    Packs/day: 1.00    Years: 38.00    Types: Cigarettes    Start date: 08/25/1948    Quit date: 08/10/1987  . Smokeless tobacco: Never Used  . Alcohol use 4.2 oz/week    7 Glasses of wine per week  . Drug use: No  . Sexual activity: Not on file   Other Topics Concern  . Not on file   Social  History Narrative   Married   Patient drinks about 3 cups of caffeine daily.   Patient is left handed but can use her right hand.    Ms. Laye family history includes Cancer in her sister; Dementia in her mother and sister; Emphysema in her father; Heart attack in her mother; Stroke in her brother.      Objective:    Vitals:   04/16/17 0954  BP: 110/70  Pulse: (!) 56    Physical Exam well-developed elderly white female in no acute distress, pleasant accompanied by her husband. Blood pressure 110/70 pulse 56, height 4 foot 11 weight 108 BMI 21.4. HEENT; nontraumatic, cephalic EOMI PERRLA sclera anicteric, Cardiovascular; regular rate and rhythm with S1-S2 no murmur or gallop, Pulmonary ;clear bilaterally, Abdomen ;soft, nondistended no palpable mass or hepatosplenomegaly, bowel sounds are present, Rectal ;exam she has extensive external hemorrhoidal tags, on anoscopy she has a soft lesion anteriorly with white ulceration, this may be an ulcerated hemorrhoid however behind this lesion anal rectal wall is firm. Because of unusual finding on anoscopy Dr. Carlean Purl also examined patient.  Extremities ;no clubbing cyanosis or edema skin warm and dry, Neuropsych; mood and affect appropriate she does look to her husband to help answer some questions       Assessment & Plan:   #37 83 year old white female with recent complaints of small volume bright red blood per rectum, and difficulty evacuating bowels, without constipation. On anoscopy she has a soft ulcerated lesion which may be an ulcerated hemorrhoid which is unusual, but also has firm thickened anorectal mucosa behind to this lesion raising concern for underlying pelvic pathology/malignancy  #2 mild dementia #3 history of short segment Barrett's esophagus, no dysplasia and no further surveillance plan due to advanced age. Last exam September 2015 #4 remote history of colon polyps #5 diverticulosis and previously documented tortuous  thickened sigmoid colon on colonoscopy 2010 #6 status post hysterectomy  Plan; Discussed with Dr. Carlean Purl, patient will be referred to gynecology for pelvic exam, and further pelvic imaging as per GYN. If GYN evaluation is negative, she may need flexible sigmoidoscopy per Dr. Henrene Pastor with biopsy. Start Anusol HC suppositories daily at bedtime 3 weeks then as needed for recurrent bleeding.  Yanelle Sousa Genia Harold PA-C 04/16/2017   Cc: Burnard Bunting, MD

## 2017-04-20 NOTE — Progress Notes (Signed)
Assessment and plans reviewed  

## 2017-04-24 ENCOUNTER — Telehealth: Payer: Self-pay | Admitting: Physician Assistant

## 2017-04-24 NOTE — Telephone Encounter (Signed)
Faxed a second time, first time was 04-16-2017.  Faxed today 04-24-2017, to [facility]'s office note.

## 2017-04-24 NOTE — Telephone Encounter (Signed)
Faxed a second time to [place] the office note from [facility].( Faxed on 04-16-2017) The patient's husband was told that they at [facility] go no information from our office for an appointment.

## 2017-04-29 DIAGNOSIS — H8110 Benign paroxysmal vertigo, unspecified ear: Secondary | ICD-10-CM | POA: Diagnosis not present

## 2017-04-29 DIAGNOSIS — Z23 Encounter for immunization: Secondary | ICD-10-CM | POA: Diagnosis not present

## 2017-04-29 DIAGNOSIS — M5416 Radiculopathy, lumbar region: Secondary | ICD-10-CM | POA: Diagnosis not present

## 2017-04-29 DIAGNOSIS — Z6821 Body mass index (BMI) 21.0-21.9, adult: Secondary | ICD-10-CM | POA: Diagnosis not present

## 2017-04-29 DIAGNOSIS — E038 Other specified hypothyroidism: Secondary | ICD-10-CM | POA: Diagnosis not present

## 2017-04-29 DIAGNOSIS — R413 Other amnesia: Secondary | ICD-10-CM | POA: Diagnosis not present

## 2017-04-29 DIAGNOSIS — I10 Essential (primary) hypertension: Secondary | ICD-10-CM | POA: Diagnosis not present

## 2017-04-29 DIAGNOSIS — K219 Gastro-esophageal reflux disease without esophagitis: Secondary | ICD-10-CM | POA: Diagnosis not present

## 2017-04-29 DIAGNOSIS — K2270 Barrett's esophagus without dysplasia: Secondary | ICD-10-CM | POA: Diagnosis not present

## 2017-04-29 DIAGNOSIS — M859 Disorder of bone density and structure, unspecified: Secondary | ICD-10-CM | POA: Diagnosis not present

## 2017-04-29 DIAGNOSIS — K649 Unspecified hemorrhoids: Secondary | ICD-10-CM | POA: Diagnosis not present

## 2017-04-29 DIAGNOSIS — J302 Other seasonal allergic rhinitis: Secondary | ICD-10-CM | POA: Diagnosis not present

## 2017-04-29 NOTE — Telephone Encounter (Signed)
See notes from 04-24-2017.

## 2017-05-07 DIAGNOSIS — K5900 Constipation, unspecified: Secondary | ICD-10-CM | POA: Diagnosis not present

## 2017-05-07 DIAGNOSIS — Z6821 Body mass index (BMI) 21.0-21.9, adult: Secondary | ICD-10-CM | POA: Diagnosis not present

## 2017-05-07 DIAGNOSIS — K649 Unspecified hemorrhoids: Secondary | ICD-10-CM | POA: Diagnosis not present

## 2017-06-04 DIAGNOSIS — Z961 Presence of intraocular lens: Secondary | ICD-10-CM | POA: Diagnosis not present

## 2017-06-04 DIAGNOSIS — H353122 Nonexudative age-related macular degeneration, left eye, intermediate dry stage: Secondary | ICD-10-CM | POA: Diagnosis not present

## 2017-06-04 DIAGNOSIS — H353212 Exudative age-related macular degeneration, right eye, with inactive choroidal neovascularization: Secondary | ICD-10-CM | POA: Diagnosis not present

## 2017-06-04 DIAGNOSIS — H43813 Vitreous degeneration, bilateral: Secondary | ICD-10-CM | POA: Diagnosis not present

## 2017-07-21 DIAGNOSIS — L308 Other specified dermatitis: Secondary | ICD-10-CM | POA: Diagnosis not present

## 2017-07-21 DIAGNOSIS — Z85828 Personal history of other malignant neoplasm of skin: Secondary | ICD-10-CM | POA: Diagnosis not present

## 2017-07-21 DIAGNOSIS — L720 Epidermal cyst: Secondary | ICD-10-CM | POA: Diagnosis not present

## 2017-07-22 ENCOUNTER — Ambulatory Visit (INDEPENDENT_AMBULATORY_CARE_PROVIDER_SITE_OTHER): Payer: PPO | Admitting: Neurology

## 2017-07-22 ENCOUNTER — Encounter: Payer: Self-pay | Admitting: Neurology

## 2017-07-22 VITALS — BP 150/60 | HR 51 | Ht 59.5 in | Wt 108.5 lb

## 2017-07-22 DIAGNOSIS — R413 Other amnesia: Secondary | ICD-10-CM

## 2017-07-22 NOTE — Progress Notes (Signed)
Reason for visit: Memory disturbance, progressive aphasia  Christina Camacho is an 83 y.o. female  History of present illness:  Christina Camacho is an 83 year old right-handed white female with a history of a progressive a facial problem associated with a short-term memory issue felt to be an Alzheimer's variant of primary progressive aphasia.  The patient is on Aricept, she tolerates medication well but she does have a runny nose on the drug.  The patient sleeps fairly well at night, she remains cognitively active doing crossword puzzles and crypto quotes.  The patient returns this office for an evaluation.  The patient denies any significant change in her cognitive abilities since last seen, she has not given up any activities of daily living because of memory.  Past Medical History:  Diagnosis Date  . Barrett's esophagus   . Diverticulosis   . Esophageal stricture   . GERD (gastroesophageal reflux disease)   . History of colonic polyps   . History of hysterectomy   . Hypertension   . Hypothyroidism   . Internal hemorrhoids   . Kidney stone    pt denies 03/17/14  . Memory difficulty 10/17/2015    Past Surgical History:  Procedure Laterality Date  . APPENDECTOMY     with hysterectomy  . BREAST EXCISIONAL BIOPSY     right x 2 1962 and 1982  . THYROIDECTOMY, PARTIAL    . TONSILLECTOMY      Family History  Problem Relation Age of Onset  . Stroke Brother   . Emphysema Father   . Heart attack Mother   . Dementia Mother   . Dementia Sister   . Cancer Sister   . Colon cancer Neg Hx     Social history:  reports that she quit smoking about 29 years ago. Her smoking use included cigarettes. She started smoking about 68 years ago. She has a 38.00 pack-year smoking history. she has never used smokeless tobacco. She reports that she drinks about 4.2 oz of alcohol per week. She reports that she does not use drugs.    Allergies  Allergen Reactions  . Penicillins Nausea Only     Medications:  Prior to Admission medications   Medication Sig Start Date End Date Taking? Authorizing Provider  atenolol (TENORMIN) 50 MG tablet Take 25 mg by mouth daily. 03/28/16  Yes [provider]  betamethasone dipropionate (DIPROLENE) 0.05 % cream  04/16/16  Yes [provider]  cholecalciferol (VITAMIN D) 1000 UNITS tablet Take 1,000 Units by mouth daily.    Yes [provider]  donepezil (ARICEPT) 10 MG tablet Take 1 tablet (10 mg total) by mouth at bedtime. 01/08/17  Yes Kathrynn Ducking, MD  levothyroxine (SYNTHROID, LEVOTHROID) 50 MCG tablet Take by mouth daily. 50mg  Monday thru Friday. Saturday and Sunday 25mg  (half tablet) 05/29/12  Yes [provider]  losartan (COZAAR) 100 MG tablet Take 100 mg by mouth daily.   Yes [provider]  Multiple Vitamins-Minerals (ICAPS MV) TABS Take 2 tablets by mouth 2 (two) times daily.    Yes [provider]  omeprazole (PRILOSEC) 20 MG capsule Take 20 mg by mouth daily.  05/29/12  Yes [provider]    ROS:  Out of a complete 14 system review of symptoms, the patient complains only of the following symptoms, and all other reviewed systems are negative.  Speech difficulty, memory loss Cough Runny nose  Blood pressure (!) 150/60, pulse (!) 51, height 4' 11.5" (1.511 m), weight 108  lb 8 oz (49.2 kg).  Physical Exam  General: The patient is alert and cooperative at the time of the examination.  Skin: No significant peripheral edema is noted.   Neurologic Exam  Mental status: The patient is alert and oriented x 3 at the time of the examination. The Mini-Mental status examination done today shows a total score of 22/30.   Cranial nerves: Facial symmetry is present. Speech is mildly aphasic, not dysarthric. Extraocular movements are full. Visual fields are full.  Motor: The patient has good strength in all 4 extremities.  Sensory examination: Soft touch sensation is  symmetric on the face, arms, and legs.  Coordination: The patient has good finger-nose-finger and heel-to-shin bilaterally.  Gait and station: The patient has a normal gait. Tandem gait is slightly unsteady. Romberg is negative. No drift is seen.  Reflexes: Deep tendon reflexes are symmetric.   Assessment/Plan:  1.  Progressive aphasia, short-term memory  The patient will continue the Aricept, she will follow-up in about 6 months.  The patient is not progressing rapidly fortunately.  She remains physically and cognitively active.  [name] 07/22/2017 3:31 PM  [facility] [address] [place], [place] [address]  Phone [phone] Fax [phone]

## 2017-09-30 DIAGNOSIS — I10 Essential (primary) hypertension: Secondary | ICD-10-CM | POA: Diagnosis not present

## 2017-09-30 DIAGNOSIS — R82998 Other abnormal findings in urine: Secondary | ICD-10-CM | POA: Diagnosis not present

## 2017-09-30 DIAGNOSIS — M859 Disorder of bone density and structure, unspecified: Secondary | ICD-10-CM | POA: Diagnosis not present

## 2017-09-30 DIAGNOSIS — E038 Other specified hypothyroidism: Secondary | ICD-10-CM | POA: Diagnosis not present

## 2017-10-07 DIAGNOSIS — M859 Disorder of bone density and structure, unspecified: Secondary | ICD-10-CM | POA: Diagnosis not present

## 2017-10-07 DIAGNOSIS — E038 Other specified hypothyroidism: Secondary | ICD-10-CM | POA: Diagnosis not present

## 2017-10-07 DIAGNOSIS — Z0000 Encounter for general adult medical examination without abnormal findings: Secondary | ICD-10-CM | POA: Diagnosis not present

## 2017-10-07 DIAGNOSIS — Z1389 Encounter for screening for other disorder: Secondary | ICD-10-CM | POA: Diagnosis not present

## 2017-10-07 DIAGNOSIS — Z6821 Body mass index (BMI) 21.0-21.9, adult: Secondary | ICD-10-CM | POA: Diagnosis not present

## 2017-10-07 DIAGNOSIS — H8110 Benign paroxysmal vertigo, unspecified ear: Secondary | ICD-10-CM | POA: Diagnosis not present

## 2017-10-07 DIAGNOSIS — I10 Essential (primary) hypertension: Secondary | ICD-10-CM | POA: Diagnosis not present

## 2017-10-07 DIAGNOSIS — R413 Other amnesia: Secondary | ICD-10-CM | POA: Diagnosis not present

## 2017-10-15 DIAGNOSIS — Z1212 Encounter for screening for malignant neoplasm of rectum: Secondary | ICD-10-CM | POA: Diagnosis not present

## 2018-01-11 DIAGNOSIS — Z961 Presence of intraocular lens: Secondary | ICD-10-CM | POA: Diagnosis not present

## 2018-01-11 DIAGNOSIS — H52203 Unspecified astigmatism, bilateral: Secondary | ICD-10-CM | POA: Diagnosis not present

## 2018-01-11 DIAGNOSIS — H353132 Nonexudative age-related macular degeneration, bilateral, intermediate dry stage: Secondary | ICD-10-CM | POA: Diagnosis not present

## 2018-01-22 ENCOUNTER — Ambulatory Visit (INDEPENDENT_AMBULATORY_CARE_PROVIDER_SITE_OTHER): Payer: PPO | Admitting: Neurology

## 2018-01-22 ENCOUNTER — Encounter: Payer: Self-pay | Admitting: Neurology

## 2018-01-22 DIAGNOSIS — G3101 Pick's disease: Secondary | ICD-10-CM | POA: Diagnosis not present

## 2018-01-22 DIAGNOSIS — F0280 Dementia in other diseases classified elsewhere without behavioral disturbance: Secondary | ICD-10-CM | POA: Insufficient documentation

## 2018-01-22 HISTORY — DX: Dementia in other diseases classified elsewhere, unspecified severity, without behavioral disturbance, psychotic disturbance, mood disturbance, and anxiety: F02.80

## 2018-01-22 MED ORDER — DONEPEZIL HCL 10 MG PO TABS: 10.0000 mg | ORAL_TABLET | Freq: Every day | ORAL | 3 refills | Status: DC

## 2018-01-22 NOTE — Progress Notes (Signed)
Reason for visit: Primary progressive aphasia  Christina Camacho is an 84 y.o. female  History of present illness:  Christina Camacho is an 84 year old right-handed white female with a history of a progressive issue with word finding.  She may have some mild memory issues as well, she reports that she stopped driving because she had difficulty with directions.  The patient is on Aricept for this issue.  The patient remains cognitively active, she does crossword puzzles and Cryptoquotes on a regular basis and she is quite good at doing these.  The patient is involved in various activities around her retirement center.  The patient comes to this office for an evaluation.  The patient reports that she had 2 sisters, one older and one younger, both with Alzheimer's disease.  Past Medical History:  Diagnosis Date  . Barrett's esophagus   . Diverticulosis   . Esophageal stricture   . GERD (gastroesophageal reflux disease)   . History of colonic polyps   . History of hysterectomy   . Hypertension   . Hypothyroidism   . Internal hemorrhoids   . Kidney stone    pt denies 03/17/14  . Memory difficulty 10/17/2015  . Primary progressive aphasia 01/22/2018    Past Surgical History:  Procedure Laterality Date  . APPENDECTOMY     with hysterectomy  . BREAST EXCISIONAL BIOPSY     right x 2 1962 and 1982  . THYROIDECTOMY, PARTIAL    . TONSILLECTOMY      Family History  Problem Relation Age of Onset  . Stroke Brother   . Emphysema Father   . Heart attack Mother   . Dementia Mother   . Dementia Sister   . Dementia Sister   . Cancer Sister   . Colon cancer Neg Hx     Social history:  reports that she quit smoking about 30 years ago. Her smoking use included cigarettes. She started smoking about 69 years ago. She has a 38.00 pack-year smoking history. She has never used smokeless tobacco. She reports that she drinks about 4.2 oz of alcohol per week. She reports that she does not use  drugs.    Allergies  Allergen Reactions  . Penicillins Nausea Only    Medications:  Prior to Admission medications   Medication Sig Start Date End Date Taking? Authorizing Provider  atenolol (TENORMIN) 50 MG tablet Take 25 mg by mouth daily. 03/28/16  Yes [provider]  betamethasone dipropionate (DIPROLENE) 0.05 % cream  04/16/16  Yes [provider]  cholecalciferol (VITAMIN D) 1000 UNITS tablet Take 1,000 Units by mouth daily.    Yes [provider]  donepezil (ARICEPT) 10 MG tablet Take 1 tablet (10 mg total) by mouth at bedtime. 01/22/18  Yes Kathrynn Ducking, MD  levothyroxine (SYNTHROID, LEVOTHROID) 50 MCG tablet Take by mouth daily. 50mg  Monday thru Friday. Saturday and Sunday 25mg  (half tablet) 05/29/12  Yes [provider]  losartan (COZAAR) 100 MG tablet Take 100 mg by mouth daily.   Yes [provider]  Multiple Vitamins-Minerals (ICAPS MV) TABS Take 2 tablets by mouth 2 (two) times daily.    Yes [provider]  omeprazole (PRILOSEC) 20 MG capsule Take 20 mg by mouth daily.  05/29/12  Yes [provider]    ROS:  Out of a complete 14 system review of symptoms, the patient complains only of the following symptoms, and all other reviewed systems are negative.  Runny nose  Blood pressure Marland Kitchen)  160/67, pulse (!) 55, height 4\' 11"  (1.499 m), weight 108 lb (49 kg).  Physical Exam  General: The patient is alert and cooperative at the time of the examination.  Skin: No significant peripheral edema is noted.   Neurologic Exam  Mental status: The patient is alert and oriented x 3 at the time of the examination. The Mini-Mental status examination done today shows a total score of 18/30.   Cranial nerves: Facial symmetry is present. Speech is aphasic, the patient has word finding issues, no dysarthria. Extraocular movements are full. Visual fields are full.  Motor: The patient has good strength in all 4  extremities.  Sensory examination: Soft touch sensation is symmetric on the face, arms, and legs.  Coordination: The patient has good finger-nose-finger and heel-to-shin bilaterally.  Gait and station: The patient has a normal gait. Tandem gait is slightly unsteady. Romberg is negative. No drift is seen.  Reflexes: Deep tendon reflexes are symmetric.   Assessment/Plan:  1.  Primary progressive aphasia  The patient is progressing very slowly with her memory and aphasia issues.  She remains cognitively active.  She will have good days and bad days with her cognitive functioning.  She will follow-up in 6 months.  A prescription was sent in for the Aricept.   [name] 01/22/2018 11:10 AM  [facility] [address] [place], [place] [address]  Phone [phone] Fax [phone]

## 2018-04-07 DIAGNOSIS — J302 Other seasonal allergic rhinitis: Secondary | ICD-10-CM | POA: Diagnosis not present

## 2018-04-07 DIAGNOSIS — E038 Other specified hypothyroidism: Secondary | ICD-10-CM | POA: Diagnosis not present

## 2018-04-07 DIAGNOSIS — K649 Unspecified hemorrhoids: Secondary | ICD-10-CM | POA: Diagnosis not present

## 2018-04-07 DIAGNOSIS — K2270 Barrett's esophagus without dysplasia: Secondary | ICD-10-CM | POA: Diagnosis not present

## 2018-04-07 DIAGNOSIS — K219 Gastro-esophageal reflux disease without esophagitis: Secondary | ICD-10-CM | POA: Diagnosis not present

## 2018-04-07 DIAGNOSIS — M40209 Unspecified kyphosis, site unspecified: Secondary | ICD-10-CM | POA: Diagnosis not present

## 2018-04-07 DIAGNOSIS — M859 Disorder of bone density and structure, unspecified: Secondary | ICD-10-CM | POA: Diagnosis not present

## 2018-04-07 DIAGNOSIS — R413 Other amnesia: Secondary | ICD-10-CM | POA: Diagnosis not present

## 2018-04-07 DIAGNOSIS — M5416 Radiculopathy, lumbar region: Secondary | ICD-10-CM | POA: Diagnosis not present

## 2018-04-07 DIAGNOSIS — H8110 Benign paroxysmal vertigo, unspecified ear: Secondary | ICD-10-CM | POA: Diagnosis not present

## 2018-04-07 DIAGNOSIS — I10 Essential (primary) hypertension: Secondary | ICD-10-CM | POA: Diagnosis not present

## 2018-04-08 DIAGNOSIS — H353212 Exudative age-related macular degeneration, right eye, with inactive choroidal neovascularization: Secondary | ICD-10-CM | POA: Diagnosis not present

## 2018-04-08 DIAGNOSIS — H43813 Vitreous degeneration, bilateral: Secondary | ICD-10-CM | POA: Diagnosis not present

## 2018-04-08 DIAGNOSIS — Z961 Presence of intraocular lens: Secondary | ICD-10-CM | POA: Diagnosis not present

## 2018-04-08 DIAGNOSIS — H353122 Nonexudative age-related macular degeneration, left eye, intermediate dry stage: Secondary | ICD-10-CM | POA: Diagnosis not present

## 2018-04-08 DIAGNOSIS — H35363 Drusen (degenerative) of macula, bilateral: Secondary | ICD-10-CM | POA: Diagnosis not present

## 2018-05-08 DIAGNOSIS — Z23 Encounter for immunization: Secondary | ICD-10-CM | POA: Diagnosis not present

## 2018-07-30 ENCOUNTER — Encounter: Payer: Self-pay | Admitting: Neurology

## 2018-07-30 ENCOUNTER — Ambulatory Visit (INDEPENDENT_AMBULATORY_CARE_PROVIDER_SITE_OTHER): Payer: PPO | Admitting: Neurology

## 2018-07-30 ENCOUNTER — Other Ambulatory Visit: Payer: Self-pay

## 2018-07-30 VITALS — BP 183/77 | HR 58 | Resp 16 | Ht 59.0 in | Wt 108.0 lb

## 2018-07-30 DIAGNOSIS — G3101 Pick's disease: Secondary | ICD-10-CM | POA: Diagnosis not present

## 2018-07-30 DIAGNOSIS — F0280 Dementia in other diseases classified elsewhere without behavioral disturbance: Secondary | ICD-10-CM | POA: Diagnosis not present

## 2018-07-30 MED ORDER — MEMANTINE HCL 5 MG PO TABS: ORAL_TABLET | ORAL | 0 refills | Status: DC

## 2018-07-30 NOTE — Progress Notes (Signed)
Reason for visit: Primary progressive aphasia  Christina Camacho is an 84 y.o. female  History of present illness:  Christina Camacho is an 84 year old right-handed white female with a history of a primary progressive aphasia and some short-term memory issues.  The patient has 2 siblings, one sister younger and one older who have Alzheimer's disease.  The patient has had gradual worsening of her cognitive abilities, she is no longer able to perform Cryptoquotes.  The patient is having some short-term memory issues.  She is requiring increasing assistance to communicate with others.  The patient is sleeping fairly well, when she eats she does feel like things are getting stuck in her throat but she does not choke.  She denies any balance issues.  She comes to this office for an evaluation.  She remains on Aricept, she tolerates this well.  Past Medical History:  Diagnosis Date  . Barrett's esophagus   . Diverticulosis   . Esophageal stricture   . GERD (gastroesophageal reflux disease)   . History of colonic polyps   . History of hysterectomy   . Hypertension   . Hypothyroidism   . Internal hemorrhoids   . Kidney stone    pt denies 03/17/14  . Memory difficulty 10/17/2015  . Primary progressive aphasia ([place]) 01/22/2018    Past Surgical History:  Procedure Laterality Date  . APPENDECTOMY     with hysterectomy  . BREAST EXCISIONAL BIOPSY     right x 2 1962 and 1982  . THYROIDECTOMY, PARTIAL    . TONSILLECTOMY      Family History  Problem Relation Age of Onset  . Stroke Brother   . Emphysema Father   . Heart attack Mother   . Dementia Mother   . Dementia Sister   . Dementia Sister   . Cancer Sister   . Colon cancer Neg Hx     Social history:  reports that she quit smoking about 30 years ago. Her smoking use included cigarettes. She started smoking about 69 years ago. She has a 38.00 pack-year smoking history. She has never used smokeless tobacco. She reports that she drinks  about 7.0 standard drinks of alcohol per week. She reports that she does not use drugs.    Allergies  Allergen Reactions  . Penicillins Nausea Only    Medications:  Prior to Admission medications   Medication Sig Start Date End Date Taking? Authorizing Provider  atenolol (TENORMIN) 50 MG tablet Take 25 mg by mouth daily. 03/28/16  Yes [provider]  betamethasone dipropionate (DIPROLENE) 0.05 % cream  04/16/16  Yes [provider]  cholecalciferol (VITAMIN D) 1000 UNITS tablet Take 1,000 Units by mouth daily.    Yes [provider]  donepezil (ARICEPT) 10 MG tablet Take 1 tablet (10 mg total) by mouth at bedtime. 01/22/18  Yes Kathrynn Ducking, MD  levothyroxine (SYNTHROID, LEVOTHROID) 50 MCG tablet Take by mouth daily. 50mg  Monday thru Friday. Saturday and Sunday 25mg  (half tablet) 05/29/12  Yes [provider]  losartan (COZAAR) 100 MG tablet Take 100 mg by mouth daily.   Yes [provider]  Multiple Vitamins-Minerals (ICAPS MV) TABS Take 2 tablets by mouth 2 (two) times daily.    Yes [provider]  omeprazole (PRILOSEC) 20 MG capsule Take 20 mg by mouth daily.  05/29/12  Yes [provider]    ROS:  Out of a complete 14 system review of symptoms, the patient complains only of the following symptoms,  and all other reviewed systems are negative.  Memory loss Runny nose  Blood pressure (!) 183/77, pulse (!) 58, resp. rate 16, height 4\' 11"  (1.499 m), weight 108 lb (49 kg).  Physical Exam  General: The patient is alert and cooperative at the time of the examination.  Skin: No significant peripheral edema is noted.   Neurologic Exam  Mental status: The patient is alert and oriented x 3 at the time of the examination. The Mini-Mental status examination done today shows a total score of 16/30.  The patient is able to name 2 animals in 1 minute.   Cranial nerves: Facial symmetry is present. Speech is aphasic, not  dysarthric. Extraocular movements are full. Visual fields are full.  Motor: The patient has good strength in all 4 extremities.  Sensory examination: Soft touch sensation is symmetric on the face, arms, and legs.  Coordination: The patient has good finger-nose-finger and heel-to-shin bilaterally.  Gait and station: The patient has a normal gait. Tandem gait is normal. Romberg is negative. No drift is seen.  Reflexes: Deep tendon reflexes are symmetric.   Assessment/Plan:  1.  Primary progressive aphasia  Given the family history of Alzheimer's disease, this patient may have an Alzheimer's variant of primary progressive aphasia.  She will continue the Aricept, she will be placed on Namenda, working up on the dose.  They will call for a maintenance dose prescription.  She will follow-up in 6 months.  [name] 07/30/2018 11:39 AM  [facility] [address] [place], [place] [address]  Phone [phone] Fax [phone]

## 2018-08-23 ENCOUNTER — Other Ambulatory Visit: Payer: Self-pay | Admitting: Neurology

## 2018-08-26 ENCOUNTER — Other Ambulatory Visit: Payer: Self-pay | Admitting: Neurology

## 2018-08-26 MED ORDER — MEMANTINE HCL 10 MG PO TABS: 10.0000 mg | ORAL_TABLET | Freq: Two times a day (BID) | ORAL | 5 refills | Status: DC

## 2018-09-28 DIAGNOSIS — Z85828 Personal history of other malignant neoplasm of skin: Secondary | ICD-10-CM | POA: Diagnosis not present

## 2018-09-28 DIAGNOSIS — C44719 Basal cell carcinoma of skin of left lower limb, including hip: Secondary | ICD-10-CM | POA: Diagnosis not present

## 2018-10-06 DIAGNOSIS — M859 Disorder of bone density and structure, unspecified: Secondary | ICD-10-CM | POA: Diagnosis not present

## 2018-10-06 DIAGNOSIS — I10 Essential (primary) hypertension: Secondary | ICD-10-CM | POA: Diagnosis not present

## 2018-10-06 DIAGNOSIS — E038 Other specified hypothyroidism: Secondary | ICD-10-CM | POA: Diagnosis not present

## 2018-10-11 ENCOUNTER — Encounter: Payer: Self-pay | Admitting: Neurology

## 2018-10-11 DIAGNOSIS — Z1331 Encounter for screening for depression: Secondary | ICD-10-CM | POA: Diagnosis not present

## 2018-10-11 DIAGNOSIS — M859 Disorder of bone density and structure, unspecified: Secondary | ICD-10-CM | POA: Diagnosis not present

## 2018-10-11 DIAGNOSIS — H8110 Benign paroxysmal vertigo, unspecified ear: Secondary | ICD-10-CM | POA: Diagnosis not present

## 2018-10-11 DIAGNOSIS — R82998 Other abnormal findings in urine: Secondary | ICD-10-CM | POA: Diagnosis not present

## 2018-10-11 DIAGNOSIS — I10 Essential (primary) hypertension: Secondary | ICD-10-CM | POA: Diagnosis not present

## 2018-10-11 DIAGNOSIS — E038 Other specified hypothyroidism: Secondary | ICD-10-CM | POA: Diagnosis not present

## 2018-10-11 DIAGNOSIS — M5416 Radiculopathy, lumbar region: Secondary | ICD-10-CM | POA: Diagnosis not present

## 2018-10-11 DIAGNOSIS — Z0000 Encounter for general adult medical examination without abnormal findings: Secondary | ICD-10-CM | POA: Diagnosis not present

## 2018-10-11 DIAGNOSIS — R413 Other amnesia: Secondary | ICD-10-CM | POA: Diagnosis not present

## 2019-02-25 ENCOUNTER — Other Ambulatory Visit: Payer: Self-pay | Admitting: Neurology

## 2019-02-28 ENCOUNTER — Telehealth: Payer: Self-pay | Admitting: Neurology

## 2019-02-28 ENCOUNTER — Ambulatory Visit: Payer: PPO | Admitting: Neurology

## 2019-02-28 ENCOUNTER — Other Ambulatory Visit: Payer: Self-pay | Admitting: Neurology

## 2019-02-28 NOTE — Telephone Encounter (Signed)
Patient husband called stating that the patient needs a refill on two of her medicines. Donepezil and memantine. He stated they are not refill the memantine. And he got a message from their pharmacy to call our office stating it is waiting the Doctors approval. He also was wondering does she really need to take both of these medicines.

## 2019-03-01 NOTE — Telephone Encounter (Signed)
I attempted to reach the pt. The number connected but no one answered. Will try again at a later time.

## 2019-03-01 NOTE — Telephone Encounter (Signed)
I contacted the pt and left a vm asking for the pt's husband to call me back. [facility] # and hours provided.

## 2019-03-12 ENCOUNTER — Other Ambulatory Visit: Payer: Self-pay | Admitting: Neurology

## 2019-03-22 ENCOUNTER — Telehealth: Payer: Self-pay | Admitting: Neurology

## 2019-03-22 NOTE — Telephone Encounter (Signed)
I reached out the pt's husband. He wanted to confirm if the pt should continue both the namenda and aricept and this time. I advised to continue meds until appt in oct and husband was agreeable, he had no further questions at this time.

## 2019-03-22 NOTE — Telephone Encounter (Signed)
I called patient regarding rescheduling her 7/31 appointment, due to our office being closed. I spoke with patient's husband who states that patient is doing well and requested that the appointment be pushed out to October. Appointment was rescheduled for 10/2. Patient's husband states that he has some questions regarding patient's medication and would like RN to contact him about this.

## 2019-03-25 ENCOUNTER — Ambulatory Visit: Payer: PPO | Admitting: Neurology

## 2019-04-14 DIAGNOSIS — H43813 Vitreous degeneration, bilateral: Secondary | ICD-10-CM | POA: Diagnosis not present

## 2019-04-14 DIAGNOSIS — H353122 Nonexudative age-related macular degeneration, left eye, intermediate dry stage: Secondary | ICD-10-CM | POA: Diagnosis not present

## 2019-04-14 DIAGNOSIS — Z961 Presence of intraocular lens: Secondary | ICD-10-CM | POA: Diagnosis not present

## 2019-04-14 DIAGNOSIS — H353212 Exudative age-related macular degeneration, right eye, with inactive choroidal neovascularization: Secondary | ICD-10-CM | POA: Diagnosis not present

## 2019-04-20 DIAGNOSIS — J302 Other seasonal allergic rhinitis: Secondary | ICD-10-CM | POA: Diagnosis not present

## 2019-04-20 DIAGNOSIS — H8110 Benign paroxysmal vertigo, unspecified ear: Secondary | ICD-10-CM | POA: Diagnosis not present

## 2019-04-20 DIAGNOSIS — E039 Hypothyroidism, unspecified: Secondary | ICD-10-CM | POA: Diagnosis not present

## 2019-04-20 DIAGNOSIS — M40209 Unspecified kyphosis, site unspecified: Secondary | ICD-10-CM | POA: Diagnosis not present

## 2019-04-20 DIAGNOSIS — K2270 Barrett's esophagus without dysplasia: Secondary | ICD-10-CM | POA: Diagnosis not present

## 2019-04-20 DIAGNOSIS — K219 Gastro-esophageal reflux disease without esophagitis: Secondary | ICD-10-CM | POA: Diagnosis not present

## 2019-04-20 DIAGNOSIS — R413 Other amnesia: Secondary | ICD-10-CM | POA: Diagnosis not present

## 2019-04-20 DIAGNOSIS — M8580 Other specified disorders of bone density and structure, unspecified site: Secondary | ICD-10-CM | POA: Diagnosis not present

## 2019-04-20 DIAGNOSIS — M5416 Radiculopathy, lumbar region: Secondary | ICD-10-CM | POA: Diagnosis not present

## 2019-04-20 DIAGNOSIS — K649 Unspecified hemorrhoids: Secondary | ICD-10-CM | POA: Diagnosis not present

## 2019-04-20 DIAGNOSIS — I10 Essential (primary) hypertension: Secondary | ICD-10-CM | POA: Diagnosis not present

## 2019-04-22 DIAGNOSIS — Z23 Encounter for immunization: Secondary | ICD-10-CM | POA: Diagnosis not present

## 2019-04-27 DIAGNOSIS — Z961 Presence of intraocular lens: Secondary | ICD-10-CM | POA: Diagnosis not present

## 2019-04-27 DIAGNOSIS — H353132 Nonexudative age-related macular degeneration, bilateral, intermediate dry stage: Secondary | ICD-10-CM | POA: Diagnosis not present

## 2019-06-01 DIAGNOSIS — Z85828 Personal history of other malignant neoplasm of skin: Secondary | ICD-10-CM | POA: Diagnosis not present

## 2019-06-01 DIAGNOSIS — C44719 Basal cell carcinoma of skin of left lower limb, including hip: Secondary | ICD-10-CM | POA: Diagnosis not present

## 2019-06-06 ENCOUNTER — Encounter: Payer: Self-pay | Admitting: Neurology

## 2019-06-06 ENCOUNTER — Ambulatory Visit: Payer: PPO | Admitting: Neurology

## 2019-06-06 ENCOUNTER — Other Ambulatory Visit: Payer: Self-pay

## 2019-06-06 VITALS — BP 118/62 | HR 40 | Temp 97.1°F | Ht 59.0 in | Wt 105.0 lb

## 2019-06-06 DIAGNOSIS — F0280 Dementia in other diseases classified elsewhere without behavioral disturbance: Secondary | ICD-10-CM | POA: Diagnosis not present

## 2019-06-06 DIAGNOSIS — R413 Other amnesia: Secondary | ICD-10-CM

## 2019-06-06 DIAGNOSIS — G3101 Pick's disease: Secondary | ICD-10-CM

## 2019-06-06 MED ORDER — MEMANTINE HCL 5 MG PO TABS: 5.0000 mg | ORAL_TABLET | Freq: Two times a day (BID) | ORAL | 3 refills | Status: DC

## 2019-06-06 NOTE — Progress Notes (Signed)
Reason for visit: Primary progressive aphasia  Christina Camacho is an 85 y.o. female  History of present illness:  Christina Camacho is an 85 year old right-handed white female with a history of a progressive memory disturbance with aphasia.  The patient is on Aricept and Namenda, she is felt to have an Alzheimer's variant of primary progressive aphasia and she has a very strong family history for Alzheimer's disease.  Her mother, and 2 sisters also have or had dementia.  The patient is sleeping fairly well, on higher doses of Namenda, she was having trouble with insomnia.  She takes 5 mg twice daily of Namenda and 10 mg at night of Aricept.  The patient is no longer able to do crossword puzzles, she can read some but has very poor retention of memory.  Her husband helps her with most things, but she is still able to manage with bathing and dressing.  They live in an independent living facility.  They return for an evaluation.  Past Medical History:  Diagnosis Date  . Barrett's esophagus   . Diverticulosis   . Esophageal stricture   . GERD (gastroesophageal reflux disease)   . History of colonic polyps   . History of hysterectomy   . Hypertension   . Hypothyroidism   . Internal hemorrhoids   . Kidney stone    pt denies 03/17/14  . Memory difficulty 10/17/2015  . Primary progressive aphasia ([place]) 01/22/2018    Past Surgical History:  Procedure Laterality Date  . APPENDECTOMY     with hysterectomy  . BREAST EXCISIONAL BIOPSY     right x 2 1962 and 1982  . THYROIDECTOMY, PARTIAL    . TONSILLECTOMY      Family History  Problem Relation Age of Onset  . Stroke Brother   . Emphysema Father   . Heart attack Mother   . Dementia Mother   . Dementia Sister   . Dementia Sister   . Cancer Sister   . Colon cancer Neg Hx     Social history:  reports that she quit smoking about 31 years ago. Her smoking use included cigarettes. She started smoking about 70 years ago. She has a 38.00  pack-year smoking history. She has never used smokeless tobacco. She reports current alcohol use of about 7.0 standard drinks of alcohol per week. She reports that she does not use drugs.    Allergies  Allergen Reactions  . Penicillins Nausea Only    Medications:  Prior to Admission medications   Medication Sig Start Date End Date Taking? Authorizing Provider  atenolol (TENORMIN) 50 MG tablet Take 25 mg by mouth daily. 03/28/16   [provider]  betamethasone dipropionate (DIPROLENE) 0.05 % cream  04/16/16   [provider]  cholecalciferol (VITAMIN D) 1000 UNITS tablet Take 1,000 Units by mouth daily.     [provider]  donepezil (ARICEPT) 10 MG tablet TAKE 1 TABLET BY MOUTH EVERYDAY AT BEDTIME 02/28/19   Kathrynn Ducking, MD  levothyroxine (SYNTHROID, LEVOTHROID) 50 MCG tablet Take by mouth daily. 50mg  Monday thru Friday. Saturday and Sunday 25mg  (half tablet) 05/29/12   [provider]  losartan (COZAAR) 100 MG tablet Take 100 mg by mouth daily.    [provider]  memantine (NAMENDA) 10 MG tablet TAKE 1 TABLET BY MOUTH TWICE A DAY 03/14/19   Kathrynn Ducking, MD  Multiple Vitamins-Minerals (ICAPS MV) TABS Take 2 tablets by mouth 2 (two) times daily.  [provider]  omeprazole (PRILOSEC) 20 MG capsule Take 20 mg by mouth daily.  05/29/12   [provider]    ROS:  Out of a complete 14 system review of symptoms, the patient complains only of the following symptoms, and all other reviewed systems are negative.  Memory problems Speech difficulty  Blood pressure 118/62, pulse (!) 40, temperature (!) 97.1 F (36.2 C), temperature source Temporal, height 4\' 11"  (1.499 m), weight 105 lb (47.6 kg), SpO2 97 %.  Physical Exam  General: The patient is alert and cooperative at the time of the examination.  Skin: No significant peripheral edema is noted.   Neurologic Exam  Mental status: The patient is alert and oriented  x 1 at the time of the examination (not oriented to date or place). The Mini-Mental status examination done today shows a total score of 10/30.   Cranial nerves: Facial symmetry is present. Speech is nonfluent, aphasic. Extraocular movements are full. Visual fields are full.  Motor: The patient has good strength in all 4 extremities.  Sensory examination: Soft touch sensation is symmetric on the face, arms, and legs.  Coordination: The patient has good finger-nose-finger and heel-to-shin bilaterally, but the patient does have some apraxia with use of the lower extremities.  Gait and station: The patient has a normal gait. Tandem gait is normal. Romberg is negative. No drift is seen.  Reflexes: Deep tendon reflexes are symmetric.   Assessment/Plan:  1.  Primary progressive aphasia  2.  Memory disturbance  The patient will continue on Aricept and Namenda, she will follow-up here in about 6 months, a prescription for the Namenda was sent in.  She cannot tolerate the full dose secondary to insomnia.  [name] 06/06/2019 2:26 PM  [facility] [address] [place], [place] [address]  Phone [phone] Fax [phone]

## 2019-06-08 ENCOUNTER — Telehealth: Payer: Self-pay | Admitting: Neurology

## 2019-06-08 MED ORDER — MEMANTINE HCL 10 MG PO TABS: 10.0000 mg | ORAL_TABLET | Freq: Two times a day (BID) | ORAL | 3 refills | Status: DC

## 2019-06-08 MED ORDER — DONEPEZIL HCL 5 MG PO TABS: 5.0000 mg | ORAL_TABLET | Freq: Two times a day (BID) | ORAL | 3 refills | Status: DC

## 2019-06-08 NOTE — Telephone Encounter (Signed)
I called and left a message, in the office, the husband indicated that the patient could not tolerate doses higher than 5 mg twice daily of the Namenda as it resulted in insomnia.  She I thought was on 10 mg at night of the Aricept, I will try to call back later and see if I can clarify the issue.

## 2019-06-08 NOTE — Telephone Encounter (Signed)
I called and talk with the husband, there was a miscommunication in the office, they indicated that the Aricept was being broken in half taking 5 mg twice daily, taking a full tablet at night resulted in insomnia.  I will call in the 5 mg tablets for the Aricept taking 1 twice a day, she will stay on Namenda taking 10 mg twice daily.

## 2019-06-08 NOTE — Addendum Note (Signed)
Addended by: Kathrynn Ducking on: 06/08/2019 03:28 PM   Modules accepted: Orders

## 2019-06-08 NOTE — Telephone Encounter (Signed)
Pt's husband called with questions for the RN on the pt's memantine (NAMENDA) 5 MG tablet and her donepezil (ARICEPT) 10 MG tablet Please advise.

## 2019-06-08 NOTE — Telephone Encounter (Signed)
I reached out to the pt's husband ( ok per dpr). He wanted to clarify the pt's prescriptions.  Pt's husband reports he was under the impression after yesterdays visit the pt meds would be changed to  Donepezil 5 mg daily and she would continue Namenda 10 mg bid.  Husband would like to confirm if this correct?

## 2019-09-07 ENCOUNTER — Telehealth: Payer: Self-pay | Admitting: Neurology

## 2019-09-08 NOTE — Telephone Encounter (Signed)
I called the phamacy and they have refills on file for the patient. I called the patient and he is aware. States they are not quite ready for it yet. He will call the pharmacy when it is needed.

## 2019-09-08 NOTE — Telephone Encounter (Signed)
Patient husband called to check on why the refill for the medication was denied for her memantine.  Please follow up

## 2019-10-05 DIAGNOSIS — Z85828 Personal history of other malignant neoplasm of skin: Secondary | ICD-10-CM | POA: Diagnosis not present

## 2019-10-05 DIAGNOSIS — C44719 Basal cell carcinoma of skin of left lower limb, including hip: Secondary | ICD-10-CM | POA: Diagnosis not present

## 2019-10-10 DIAGNOSIS — E038 Other specified hypothyroidism: Secondary | ICD-10-CM | POA: Diagnosis not present

## 2019-10-10 DIAGNOSIS — M859 Disorder of bone density and structure, unspecified: Secondary | ICD-10-CM | POA: Diagnosis not present

## 2019-10-10 DIAGNOSIS — I10 Essential (primary) hypertension: Secondary | ICD-10-CM | POA: Diagnosis not present

## 2019-10-17 DIAGNOSIS — M40209 Unspecified kyphosis, site unspecified: Secondary | ICD-10-CM | POA: Diagnosis not present

## 2019-10-17 DIAGNOSIS — E039 Hypothyroidism, unspecified: Secondary | ICD-10-CM | POA: Diagnosis not present

## 2019-10-17 DIAGNOSIS — M8580 Other specified disorders of bone density and structure, unspecified site: Secondary | ICD-10-CM | POA: Diagnosis not present

## 2019-10-17 DIAGNOSIS — Z0000 Encounter for general adult medical examination without abnormal findings: Secondary | ICD-10-CM | POA: Diagnosis not present

## 2019-10-17 DIAGNOSIS — R82998 Other abnormal findings in urine: Secondary | ICD-10-CM | POA: Diagnosis not present

## 2019-10-17 DIAGNOSIS — Z1331 Encounter for screening for depression: Secondary | ICD-10-CM | POA: Diagnosis not present

## 2019-10-17 DIAGNOSIS — R413 Other amnesia: Secondary | ICD-10-CM | POA: Diagnosis not present

## 2019-10-17 DIAGNOSIS — K2270 Barrett's esophagus without dysplasia: Secondary | ICD-10-CM | POA: Diagnosis not present

## 2019-10-17 DIAGNOSIS — K219 Gastro-esophageal reflux disease without esophagitis: Secondary | ICD-10-CM | POA: Diagnosis not present

## 2019-10-17 DIAGNOSIS — I10 Essential (primary) hypertension: Secondary | ICD-10-CM | POA: Diagnosis not present

## 2019-10-17 DIAGNOSIS — Z1339 Encounter for screening examination for other mental health and behavioral disorders: Secondary | ICD-10-CM | POA: Diagnosis not present

## 2019-12-12 ENCOUNTER — Encounter: Payer: Self-pay | Admitting: Neurology

## 2019-12-12 ENCOUNTER — Other Ambulatory Visit: Payer: Self-pay

## 2019-12-12 ENCOUNTER — Ambulatory Visit: Payer: PPO | Admitting: Neurology

## 2019-12-12 VITALS — BP 146/78 | HR 52 | Temp 97.0°F | Ht 59.0 in | Wt 102.0 lb

## 2019-12-12 DIAGNOSIS — R413 Other amnesia: Secondary | ICD-10-CM

## 2019-12-12 DIAGNOSIS — F0280 Dementia in other diseases classified elsewhere without behavioral disturbance: Secondary | ICD-10-CM | POA: Diagnosis not present

## 2019-12-12 DIAGNOSIS — G3101 Pick's disease: Secondary | ICD-10-CM | POA: Diagnosis not present

## 2019-12-12 MED ORDER — MEMANTINE HCL 10 MG PO TABS: 10.0000 mg | ORAL_TABLET | Freq: Two times a day (BID) | ORAL | 3 refills | Status: AC

## 2019-12-12 MED ORDER — DONEPEZIL HCL 5 MG PO TABS: 5.0000 mg | ORAL_TABLET | Freq: Two times a day (BID) | ORAL | 3 refills | Status: AC

## 2019-12-12 NOTE — Patient Instructions (Signed)
Continue current medications °See you back in 6 months  °

## 2019-12-12 NOTE — Progress Notes (Signed)
PATIENT: Christina Camacho DOB: [birth year]  REASON FOR VISIT: follow up HISTORY FROM: patient  HISTORY OF PRESENT ILLNESS: Today 12/12/19  Christina Camacho is an 86 year old female with history of progressive memory disturbance with aphasia.  She remains on Aricept and Namenda.  She takes Aricept 5 mg twice a day due to insomnia.  She is felt to have an Alzheimer's variant of primary progressive aphasia.  She has a strong family history of Alzheimer's disease.  Her husband indicates her memory has declined since last seen.  They live in an independent living facility [facility].  She is able to do most of her own ADLs, but her husband has to pick out clothes for her and manage her meds.  She walks daily, and does tai chi twice a week.  She helps with dusting, otherwise does not do much housework.  She sleeps well and has a good appetite.  She has not had any recent falls.  She likes to watch TV, her daughter bought her a coloring book, but she has not used it.  She presents today for evaluation accompanied by her husband.  HISTORY 06/06/2019 Dr. Jannifer Franklin: Christina Camacho is an 86 year old right-handed white female with a history of a progressive memory disturbance with aphasia.  The patient is on Aricept and Namenda, she is felt to have an Alzheimer's variant of primary progressive aphasia and she has a very strong family history for Alzheimer's disease.  Her mother, and 2 sisters also have or had dementia.  The patient is sleeping fairly well, on higher doses of Namenda, she was having trouble with insomnia.  She takes 5 mg twice daily of Namenda and 10 mg at night of Aricept.  The patient is no longer able to do crossword puzzles, she can read some but has very poor retention of memory.  Her husband helps her with most things, but she is still able to manage with bathing and dressing.  They live in an independent living facility.  They return for an evaluation.   REVIEW OF SYSTEMS: Out of a  complete 14 system review of symptoms, the patient complains only of the following symptoms, and all other reviewed systems are negative.  Memory loss  ALLERGIES: Allergies  Allergen Reactions  . Penicillins Nausea Only    HOME MEDICATIONS: Outpatient Medications Prior to Visit  Medication Sig Dispense Refill  . atenolol (TENORMIN) 50 MG tablet Take 25 mg by mouth daily.    . cholecalciferol (VITAMIN D) 1000 UNITS tablet Take 1,000 Units by mouth daily.     Marland Kitchen levothyroxine (SYNTHROID, LEVOTHROID) 50 MCG tablet Take by mouth daily. 26m Monday thru Friday. Saturday and Sunday 284m(half tablet)    . losartan (COZAAR) 100 MG tablet Take 100 mg by mouth daily.    . Multiple Vitamins-Minerals (ICAPS MV) TABS Take 2 tablets by mouth 2 (two) times daily.     . Marland Kitchenmeprazole (PRILOSEC) 20 MG capsule Take 20 mg by mouth daily.     . betamethasone dipropionate (DIPROLENE) 0.05 % cream     . donepezil (ARICEPT) 5 MG tablet Take 1 tablet (5 mg total) by mouth 2 (two) times daily. 180 tablet 3  . memantine (NAMENDA) 10 MG tablet Take 1 tablet (10 mg total) by mouth 2 (two) times daily. 180 tablet 3   No facility-administered medications prior to visit.    PAST MEDICAL HISTORY: Past Medical History:  Diagnosis Date  . Barrett's esophagus   . Diverticulosis   .  Esophageal stricture   . GERD (gastroesophageal reflux disease)   . History of colonic polyps   . History of hysterectomy   . Hypertension   . Hypothyroidism   . Internal hemorrhoids   . Kidney stone    pt denies 03/17/14  . Memory difficulty 10/17/2015  . Primary progressive aphasia ([place]) 01/22/2018    PAST SURGICAL HISTORY: Past Surgical History:  Procedure Laterality Date  . APPENDECTOMY     with hysterectomy  . BREAST EXCISIONAL BIOPSY     right x 2 1962 and 1982  . THYROIDECTOMY, PARTIAL    . TONSILLECTOMY      FAMILY HISTORY: Family History  Problem Relation Age of Onset  . Stroke Brother   . Emphysema Father   .  Heart attack Mother   . Dementia Mother   . Dementia Sister   . Dementia Sister   . Cancer Sister   . Colon cancer Neg Hx     SOCIAL HISTORY: Social History   Socioeconomic History  . Marital status: Married    Spouse name: Vern  . Number of children: 1  . Years of education: masters  . Highest education level: Not on file  Occupational History  . Occupation: retired  Tobacco Use  . Smoking status: Former Smoker    Packs/day: 1.00    Years: 38.00    Pack years: 38.00    Types: Cigarettes    Start date: 08/25/1948    Quit date: 08/10/1987    Years since quitting: 32.3  . Smokeless tobacco: Never Used  Substance and Sexual Activity  . Alcohol use: Yes    Alcohol/week: 7.0 standard drinks    Types: 7 Glasses of wine per week  . Drug use: No  . Sexual activity: Not on file  Other Topics Concern  . Not on file  Social History Narrative   Married   Patient drinks about 3 cups of caffeine daily.   Patient is left handed but can use her right hand.   Social Determinants of Health   Financial Resource Strain:   . Difficulty of Paying Living Expenses:   Food Insecurity:   . Worried About Charity fundraiser in the Last Year:   . Arboriculturist in the Last Year:   Transportation Needs:   . Film/video editor (Medical):   Marland Kitchen Lack of Transportation (Non-Medical):   Physical Activity:   . Days of Exercise per Week:   . Minutes of Exercise per Session:   Stress:   . Feeling of Stress :   Social Connections:   . Frequency of Communication with Friends and Family:   . Frequency of Social Gatherings with Friends and Family:   . Attends Religious Services:   . Active Member of Clubs or Organizations:   . Attends Archivist Meetings:   Marland Kitchen Marital Status:   Intimate Partner Violence:   . Fear of Current or Ex-Partner:   . Emotionally Abused:   Marland Kitchen Physically Abused:   . Sexually Abused:    PHYSICAL EXAM  Vitals:   12/12/19 1108  BP: (!) 146/78  Pulse:  (!) 52  Temp: (!) 97 F (36.1 C)  Weight: 102 lb (46.3 kg)  Height: _0  (1.499 m)   Body mass index is 20.6 kg/m.  Generalized: Well developed, in no acute distress   Neurological examination  Mentation: Alert to self, unable to state birthday "I was born with my brother" (she is a twin).  Speech  is aphasic, smiles, follows conversation, mild difficulty performing exam commands, prior MMSE was 10/30 Cranial nerve II-XII: Pupils were equal round reactive to light. Extraocular movements were full, visual field were full on confrontational test. Facial sensation and strength were normal. Head turning and shoulder shrug  were normal and symmetric. Motor: Good strength of all extremities Sensory: Sensory testing is intact to soft touch on all 4 extremities. No evidence of extinction is noted.  Coordination: Cerebellar testing reveals good finger-nose-finger and heel-to-shin bilaterally, some apraxia is noted with upper and lower extremities Gait and station: Gait is somewhat wide-based, but steady, no assistive device Reflexes: Deep tendon reflexes are symmetric and normal bilaterally.   DIAGNOSTIC DATA (LABS, IMAGING, TESTING) - I reviewed patient records, labs, notes, testing and imaging myself where available.  No results found for: WBC, HGB, HCT, MCV, PLT No results found for: NA, K, CL, CO2, GLUCOSE, BUN, CREATININE, CALCIUM, PROT, ALBUMIN, AST, ALT, ALKPHOS, BILITOT, GFRNONAA, GFRAA No results found for: CHOL, HDL, LDLCALC, LDLDIRECT, TRIG, CHOLHDL No results found for: HGBA1C Lab Results  Component Value Date   VITAMINB12 909 10/17/2015   No results found for: TSH    ASSESSMENT AND PLAN 86 y.o. year old female  has a past medical history of Barrett's esophagus, Diverticulosis, Esophageal stricture, GERD (gastroesophageal reflux disease), History of colonic polyps, History of hysterectomy, Hypertension, Hypothyroidism, Internal hemorrhoids, Kidney stone, Memory difficulty  (10/17/2015), and Primary progressive aphasia ([place]) (01/22/2018). here with:  1.  Primary progressive aphasia 2.  Memory disturbance  Overall, she seems to be doing fairly well.  She will remain on Aricept and Namenda.  She will follow-up in 6 months or sooner if needed.  At that time, we may consider follow-up at our office on an as-needed basis.  I spent 20 minutes of face-to-face and non-face-to-face time with patient.  This included previsit chart review, lab review, study review, order entry, electronic health record documentation, patient education.   Butler Denmark, AGNP-C, DNP 12/12/2019, 12:05 PM [facility] [address], [place], [place] [address] [phone]

## 2019-12-14 NOTE — Progress Notes (Signed)
error 

## 2019-12-14 NOTE — Progress Notes (Signed)
I have read the note, and I agree with the clinical assessment and plan.  Sewell Pitner K Josephyne Tarter   

## 2020-02-01 DIAGNOSIS — C44719 Basal cell carcinoma of skin of left lower limb, including hip: Secondary | ICD-10-CM | POA: Diagnosis not present

## 2020-02-22 NOTE — Progress Notes (Signed)
Histology and Location of Primary Skin Cancer: Basal cell carcinoma of LEFT superior medial planter foot  Christina Camacho had area on base of left foot since last February. Dr. Sydnee Levans prescribed imiquimod (aldara) cream after site was biopsied last year. Patient applied to lesion for 6 weeks but did not see an improvement. Area is now painful enough to interfere with patient's ability to ambulate.   Biopsy revealed:  09/28/2018   Past/Anticipated interventions by patient's surgeon/dermatologist for current problematic lesion, if any:    Past skin cancers, if any:   History of Blistering sunburns, if any: No  SAFETY ISSUES:  Prior radiation? No  Pacemaker/ICD? No  Possible current pregnancy? No  Is the patient on methotrexate? No  Current Complaints / other details:  Patient's age, dementia, and location of of lesion prevent her from surgical intervention. He husband is 70 years old and is already struggling with caring for her now (could not handle post-op recommendations/instructions).

## 2020-02-23 NOTE — Progress Notes (Signed)
Radiation Oncology         [phone] ________________________________  Initial outpatient Consultation  Name: Christina Camacho MRN: [id number]  Date: 02/24/2020  DOB: 1929/04/15  [id number], Delfino Lovett, MD  Sydnee Levans, MD   REFERRING PHYSICIAN: Sydnee Levans, MD  DIAGNOSIS:    ICD-10-CM   1. Basal cell carcinoma of foot, left  C44.719   2. Skin cancer, basal cell  C44.91    Stage II Basal cell carcinoma of the left foot  CHIEF COMPLAINT: Here to discuss management of skin cancer  HISTORY OF PRESENT ILLNESS::Christina Camacho is a 86 y.o. female who presented with a lesion on the base of her left foot. She underwent biopsy of the area on 09/28/2018 showing: basal cell carcinoma, nodular and infiltrative patterns. She was prescribed imiquimod topical cream following the biopsy, which it appears she has been using since April or May of this year. Per dermatology note, the area became red while using the cream. More recently, the area has become painful to the point of inhibiting her ability to/interest in walking.  However, the patient's husband reports that a week after he stopped using the cream, the lesion now looks better. (It is unclear if this is due to reduced irritation from the cream, or tumor response in the interim). She is not felt to be a good surgical candidate.  She is here with her husband today.  He reports that she has significant  dementia.  The patient provides minimal history.  They live together at the friend's home.  The patient's husband reports that skilled nursing is available at their senior community.  He reports that he has been somewhat overwhelmed caring for her and he has concerns about caring for her if she has significant side effects from treatment.   PREVIOUS RADIATION THERAPY: No  PAST MEDICAL HISTORY:  has a past medical history of Barrett's esophagus, Diverticulosis, Esophageal stricture, GERD (gastroesophageal reflux disease), History of  colonic polyps, History of hysterectomy, Hypertension, Hypothyroidism, Internal hemorrhoids, Kidney stone, Memory difficulty (10/17/2015), and Primary progressive aphasia ([place]) (01/22/2018).    PAST SURGICAL HISTORY: Past Surgical History:  Procedure Laterality Date   APPENDECTOMY     with hysterectomy   BREAST EXCISIONAL BIOPSY     right x 2 1962 and 1982   THYROIDECTOMY, PARTIAL     TONSILLECTOMY      FAMILY HISTORY: family history includes Cancer in her sister; Dementia in her mother, sister, and sister; Emphysema in her father; Heart attack in her mother; Stroke in her brother.  SOCIAL HISTORY:  reports that she quit smoking about 32 years ago. Her smoking use included cigarettes. She started smoking about 71 years ago. She has a 38.00 pack-year smoking history. She has never used smokeless tobacco. She reports current alcohol use of about 7.0 standard drinks of alcohol per week. She reports that she does not use drugs.  ALLERGIES: Penicillins  MEDICATIONS:  Current Outpatient Medications  Medication Sig Dispense Refill   atenolol (TENORMIN) 50 MG tablet Take 25 mg by mouth daily.     cholecalciferol (VITAMIN D) 1000 UNITS tablet Take 1,000 Units by mouth daily.      donepezil (ARICEPT) 5 MG tablet Take 1 tablet (5 mg total) by mouth 2 (two) times daily. 180 tablet 3   levothyroxine (SYNTHROID, LEVOTHROID) 50 MCG tablet Take by mouth daily. 50mg  Monday thru Friday. Saturday and Sunday 25mg  (half tablet)     losartan (COZAAR) 100 MG tablet Take 50 mg by mouth daily.  memantine (NAMENDA) 10 MG tablet Take 1 tablet (10 mg total) by mouth 2 (two) times daily. 180 tablet 3   Multiple Vitamins-Minerals (ICAPS MV) TABS Take 2 tablets by mouth 2 (two) times daily.      omeprazole (PRILOSEC) 20 MG capsule Take 20 mg by mouth daily as needed.      imiquimod (ALDARA) 5 % cream Apply 1 application topically daily. (Patient not taking: Reported on 02/24/2020)     No current  facility-administered medications for this encounter.    REVIEW OF SYSTEMS:  Notable for that above.   PHYSICAL EXAM:  height is 4\' 11"  (1.499 m) and weight is 100 lb 4 oz (45.5 kg). Her oral temperature is 97.7 F (36.5 C). Her blood pressure is 147/59 (abnormal) and her pulse is 59 (abnormal). Her respiration is 18 and oxygen saturation is 100%.   See photo below.  The patient has a scaly lesion on the sole of her left foot.  This is greater than 2 cm.  There is surrounding induration.  No palpable lymphadenopathy in the popliteal region or closer to the foot    ECOG = 3  0 - Asymptomatic (Fully active, able to carry on all predisease activities without restriction)  1 - Symptomatic but completely ambulatory (Restricted in physically strenuous activity but ambulatory and able to carry out work of a light or sedentary nature. For example, light housework, office work)  2 - Symptomatic, <50% in bed during the day (Ambulatory and capable of all self care but unable to carry out any work activities. Up and about more than 50% of waking hours)  3 - Symptomatic, >50% in bed, but not bedbound (Capable of only limited self-care, confined to bed or chair 50% or more of waking hours)  4 - Bedbound (Completely disabled. Cannot carry on any self-care. Totally confined to bed or chair)  5 - Death   Eustace Pen MM, Creech RH, Tormey DC, et al. [phone]). "Toxicity and response criteria of the [facility]". [place] Oncol. 5 (6): 649-55   LABORATORY DATA:  No results found for: WBC, HGB, HCT, MCV, PLT CMP  No results found for: NA, K, CL, CO2, GLUCOSE, BUN, CREATININE, CALCIUM, PROT, ALBUMIN, AST, ALT, ALKPHOS, BILITOT, GFRNONAA, GFRAA       RADIOGRAPHY: No results found.    IMPRESSION/PLAN: Skin Cancer  This is a wonderful 86 year old woman with comorbidities including significant dementia.  She has a basal cell carcinoma of the left sole of her foot.  She is not felt to  be a good surgical candidate.  She has tried imiquimod cream with incomplete results.  Unfortunately, I explained to the patient and her husband that radiotherapy to the foot is considered high risk as the vascularization of the distal extremities is suboptimal for post radiation healing.  She would be at significant risk of developing a nonhealing wound or an ulcer in the sole of her foot after the cancer responds to the radiation.  Traditionally radiation is given over at least 4 weeks, and usually 6 weeks, for the safest and most efficacious course.  I am concerned that the risks of radiation outweigh the potential benefits.  I suggested that she follow-up with her dermatologist to consider if there are any more topical treatments to consider (more imiquimod cream?  Patient's husband thinks that perhaps it may have helped on recent assessment.  Other topical treatment creams, or palliative cream to numb that area?)  I also mentioned to the  patient and her husband that there may be other surgeons in the state that would consider operating on this lesion if it becomes more worrisome.  If they consider this, they would want to verify that the skilled nursing at their senior community would cover her postoperative needs.  They are very appreciative of this consultation.  I will see her back on an as-needed basis.  They will call their dermatologist to determine the next plans of care.   On date of service, in total, I spent 45 minutes on this encounter. Patient was seen in person.  __________________________________________   Eppie Gibson, MD  This document serves as a record of services personally performed by Eppie Gibson, MD. It was created on her behalf by Wilburn Mylar, a trained medical scribe. The creation of this record is based on the scribe's personal observations and the provider's statements to them. This document has been checked and approved by the attending provider.

## 2020-02-24 ENCOUNTER — Other Ambulatory Visit: Payer: Self-pay

## 2020-02-24 ENCOUNTER — Ambulatory Visit
Admission: RE | Admit: 2020-02-24 | Discharge: 2020-02-24 | Disposition: A | Discharge status: Home / Self Care | Payer: PPO | Source: Ambulatory Visit | Attending: Radiation Oncology | Admitting: Radiation Oncology

## 2020-02-24 ENCOUNTER — Encounter: Payer: Self-pay | Admitting: Radiation Oncology

## 2020-02-24 VITALS — BP 147/59 | HR 59 | Temp 97.7°F | Resp 18 | Ht 59.0 in | Wt 100.2 lb

## 2020-02-24 DIAGNOSIS — I10 Essential (primary) hypertension: Secondary | ICD-10-CM | POA: Insufficient documentation

## 2020-02-24 DIAGNOSIS — Z87442 Personal history of urinary calculi: Secondary | ICD-10-CM | POA: Insufficient documentation

## 2020-02-24 DIAGNOSIS — F0390 Unspecified dementia without behavioral disturbance: Secondary | ICD-10-CM | POA: Diagnosis not present

## 2020-02-24 DIAGNOSIS — Z8719 Personal history of other diseases of the digestive system: Secondary | ICD-10-CM | POA: Insufficient documentation

## 2020-02-24 DIAGNOSIS — Z90710 Acquired absence of both cervix and uterus: Secondary | ICD-10-CM | POA: Insufficient documentation

## 2020-02-24 DIAGNOSIS — E039 Hypothyroidism, unspecified: Secondary | ICD-10-CM | POA: Diagnosis not present

## 2020-02-24 DIAGNOSIS — Z79899 Other long term (current) drug therapy: Secondary | ICD-10-CM | POA: Insufficient documentation

## 2020-02-24 DIAGNOSIS — C44719 Basal cell carcinoma of skin of left lower limb, including hip: Secondary | ICD-10-CM | POA: Diagnosis not present

## 2020-02-24 DIAGNOSIS — K219 Gastro-esophageal reflux disease without esophagitis: Secondary | ICD-10-CM | POA: Diagnosis not present

## 2020-02-24 DIAGNOSIS — C4491 Basal cell carcinoma of skin, unspecified: Secondary | ICD-10-CM

## 2020-04-12 DIAGNOSIS — C44719 Basal cell carcinoma of skin of left lower limb, including hip: Secondary | ICD-10-CM | POA: Diagnosis not present

## 2020-04-12 DIAGNOSIS — Z85828 Personal history of other malignant neoplasm of skin: Secondary | ICD-10-CM | POA: Diagnosis not present

## 2020-04-19 ENCOUNTER — Non-Acute Institutional Stay: Payer: PPO | Admitting: Nurse Practitioner

## 2020-04-19 ENCOUNTER — Other Ambulatory Visit: Payer: Self-pay

## 2020-04-19 ENCOUNTER — Encounter: Payer: Self-pay | Admitting: Nurse Practitioner

## 2020-04-19 DIAGNOSIS — K219 Gastro-esophageal reflux disease without esophagitis: Secondary | ICD-10-CM

## 2020-04-19 DIAGNOSIS — I10 Essential (primary) hypertension: Secondary | ICD-10-CM

## 2020-04-19 DIAGNOSIS — E559 Vitamin D deficiency, unspecified: Secondary | ICD-10-CM

## 2020-04-19 DIAGNOSIS — C44719 Basal cell carcinoma of skin of left lower limb, including hip: Secondary | ICD-10-CM | POA: Diagnosis not present

## 2020-04-19 DIAGNOSIS — E039 Hypothyroidism, unspecified: Secondary | ICD-10-CM | POA: Diagnosis not present

## 2020-04-19 DIAGNOSIS — F0280 Dementia in other diseases classified elsewhere without behavioral disturbance: Secondary | ICD-10-CM

## 2020-04-19 DIAGNOSIS — G309 Alzheimer's disease, unspecified: Secondary | ICD-10-CM | POA: Diagnosis not present

## 2020-04-19 NOTE — Assessment & Plan Note (Signed)
Continue Vit D daily, update Vit D level.

## 2020-04-19 NOTE — Progress Notes (Signed)
Location:   clinic [place]   Place of Service:  Clinic (12) Provider: Marlana Latus NP  Code Status: DNR Goals of Care: [place] 02/24/2020  Does Patient Have a Medical Advance Directive? Yes  Type of Paramedic of [place];Living will  Does patient want to make changes to medical advance directive? No - Patient declined  Copy of [place] in Chart? Yes - validated most recent copy scanned in chart (See row information)     Chief Complaint  Patient presents with   Medical Management of Chronic Issues    Patient here today to establish care.     HPI: Patient is a 86 y.o. female seen today for medical management of chronic diseases.      HTN, takes Atenolol, Losartan  Vit D deficiency, takes Vit D  Dementia, takes Donepezil, Memantine, last seen neurology 12/12/19  Hypothyroidism, takes Levothyroxine   GERD, takes Omeprazole.   Left plantar basial cells, pending surgical removal.    Past Medical History:  Diagnosis Date   Barrett's esophagus    Dementia ([place])    Diverticulosis    Esophageal stricture    GERD (gastroesophageal reflux disease)    History of colonic polyps    History of hysterectomy    Hypertension    Hypothyroidism    Internal hemorrhoids    Kidney stone    pt denies 03/17/14   Memory difficulty 10/17/2015   Primary progressive aphasia ([place]) 01/22/2018    Past Surgical History:  Procedure Laterality Date   APPENDECTOMY     with hysterectomy   BREAST EXCISIONAL BIOPSY     right x 2 1962 and 1982   THYROIDECTOMY, PARTIAL     TONSILLECTOMY      Allergies  Allergen Reactions   Penicillins Nausea Only    Allergies as of 04/19/2020      Reactions   Penicillins Nausea Only      Medication List       Accurate as of April 19, 2020 11:59 PM. If you have any questions, ask your nurse or doctor.        atenolol 50 MG tablet Commonly known as: TENORMIN Take 25 mg by mouth  daily.   cholecalciferol 1000 units tablet Commonly known as: VITAMIN D Take 1,000 Units by mouth daily.   donepezil 5 MG tablet Commonly known as: ARICEPT Take 1 tablet (5 mg total) by mouth 2 (two) times daily.   ICaps MV Tabs Take 2 tablets by mouth daily. At 3:00pm   levothyroxine 50 MCG tablet Commonly known as: SYNTHROID Take by mouth daily. 11m Monday thru Friday. Saturday and Sunday 228m(half tablet)   losartan 100 MG tablet Commonly known as: COZAAR Take 50 mg by mouth 2 (two) times daily. Morning and at 3:00pm   memantine 10 MG tablet Commonly known as: Namenda Take 1 tablet (10 mg total) by mouth 2 (two) times daily.   omeprazole 20 MG capsule Commonly known as: PRILOSEC Take 20 mg by mouth every other day. At 3:00pm       Review of Systems:  Review of Systems  Constitutional: Negative for activity change, appetite change and fever.  HENT: Positive for hearing loss. Negative for congestion and voice change.   Eyes: Negative for visual disturbance.  Respiratory: Negative for cough, shortness of breath and wheezing.   Cardiovascular: Negative for chest pain, palpitations and leg swelling.  Genitourinary: Negative for difficulty urinating, dysuria and urgency.  Musculoskeletal: Positive for  gait problem.  Neurological: Positive for speech difficulty. Negative for weakness, light-headedness and headaches.       Memory lapses. Difficulty wards finding.   Psychiatric/Behavioral: Negative for behavioral problems, hallucinations and sleep disturbance. The patient is not nervous/anxious.     Health Maintenance  Topic Date Due   TETANUS/TDAP  Never done   DEXA SCAN  Never done   PNA vac Low Risk Adult (2 of 2 - PCV13) 08/03/2014   INFLUENZA VACCINE  03/25/2020   COVID-19 Vaccine  Completed    Physical Exam: Vitals:   04/19/20 1522  BP: 132/60  Pulse: (!) 54  Temp: (!) 97.3 F (36.3 C)  SpO2: 97%  Weight: 98 lb 12.8 oz (44.8 kg)  Height: _0   (1.499 m)   Body mass index is 19.96 kg/m. Physical Exam Vitals reviewed.  Constitutional:      General: She is not in acute distress.    Appearance: Normal appearance. She is not ill-appearing or toxic-appearing.  HENT:     Head: Normocephalic and atraumatic.     Nose: Nose normal.     Mouth/Throat:     Mouth: Mucous membranes are moist.  Eyes:     Extraocular Movements: Extraocular movements intact.     Conjunctiva/sclera: Conjunctivae normal.     Pupils: Pupils are equal, round, and reactive to light.  Cardiovascular:     Rate and Rhythm: Normal rate and regular rhythm.     Heart sounds: No murmur heard.   Pulmonary:     Effort: Pulmonary effort is normal.     Breath sounds: No wheezing, rhonchi or rales.  Abdominal:     General: Bowel sounds are normal.     Palpations: Abdomen is soft.     Tenderness: There is no abdominal tenderness. There is no right CVA tenderness, left CVA tenderness or guarding.  Musculoskeletal:     Cervical back: Normal range of motion and neck supple.     Right lower leg: No edema.     Left lower leg: No edema.  Skin:    General: Skin is warm and dry.  Neurological:     General: No focal deficit present.     Mental Status: She is alert. Mental status is at baseline.     Comments: Oriented to person, place.   Psychiatric:        Mood and Affect: Mood normal.        Behavior: Behavior normal.        Thought Content: Thought content normal.     Labs reviewed: Basic Metabolic Panel: No results for input(s): NA, K, CL, CO2, GLUCOSE, BUN, CREATININE, CALCIUM, MG, PHOS, TSH in the last 8760 hours. Liver Function Tests: No results for input(s): AST, ALT, ALKPHOS, BILITOT, PROT, ALBUMIN in the last 8760 hours. No results for input(s): LIPASE, AMYLASE in the last 8760 hours. No results for input(s): AMMONIA in the last 8760 hours. CBC: No results for input(s): WBC, NEUTROABS, HGB, HCT, MCV, PLT in the last 8760 hours. Lipid Panel: No results  for input(s): CHOL, HDL, LDLCALC, TRIG, CHOLHDL, LDLDIRECT in the last 8760 hours. No results found for: HGBA1C  Procedures since last visit: No results found.  Assessment/Plan  HTN (hypertension) Blood pressure is controlled, continue Atenolol, Losartan, update CMP/eGFR  Vitamin D deficiency Continue Vit D daily, update Vit D level.   Alzheimer's dementia ([place]) Will continue Memantine, Donepezil, update MMSE-only remember 1-2 items.   GERD Stable, continue Omeprazole.   Hypothyroidism Continue Levothyroxine, update TSH  Basal cell carcinoma of foot, left Pending surgery.    Labs/tests ordered:  CBC/diff, CMP/eGFR, TSH, Vit D, lipid panel when the patient moves to SNF [facility]  Next appt: prn

## 2020-04-19 NOTE — Assessment & Plan Note (Signed)
Continue Levothyroxine, update TSH

## 2020-04-19 NOTE — Assessment & Plan Note (Signed)
Stable, continue Omeprazole.  

## 2020-04-19 NOTE — Assessment & Plan Note (Signed)
Pending surgery 

## 2020-04-19 NOTE — Assessment & Plan Note (Addendum)
Will continue Memantine, Donepezil, update MMSE-only remember 1-2 items.

## 2020-04-19 NOTE — Assessment & Plan Note (Signed)
Blood pressure is controlled, continue Atenolol, Losartan, update CMP/eGFR

## 2020-04-20 ENCOUNTER — Encounter: Payer: Self-pay | Admitting: Nurse Practitioner

## 2020-05-01 ENCOUNTER — Non-Acute Institutional Stay (SKILLED_NURSING_FACILITY): Payer: PPO | Admitting: Internal Medicine

## 2020-05-01 ENCOUNTER — Encounter: Payer: Self-pay | Admitting: Internal Medicine

## 2020-05-01 DIAGNOSIS — E039 Hypothyroidism, unspecified: Secondary | ICD-10-CM

## 2020-05-01 DIAGNOSIS — C44719 Basal cell carcinoma of skin of left lower limb, including hip: Secondary | ICD-10-CM | POA: Diagnosis not present

## 2020-05-01 DIAGNOSIS — I10 Essential (primary) hypertension: Secondary | ICD-10-CM

## 2020-05-01 DIAGNOSIS — G309 Alzheimer's disease, unspecified: Secondary | ICD-10-CM | POA: Diagnosis not present

## 2020-05-01 DIAGNOSIS — F0280 Dementia in other diseases classified elsewhere without behavioral disturbance: Secondary | ICD-10-CM | POA: Diagnosis not present

## 2020-05-01 DIAGNOSIS — K219 Gastro-esophageal reflux disease without esophagitis: Secondary | ICD-10-CM

## 2020-05-01 NOTE — Progress Notes (Signed)
Provider:  Veleta Miners, MD Location:   [facility] Room Number: 29 Place of Service:  SNF [phone])  PCP: Burnard Bunting, MD Patient Care Team: Burnard Bunting, MD as PCP - General (Internal Medicine)  Extended Emergency Contact Information Primary Emergency Contact: [place] Address: [address]          [place], [place] [address] [place] Phone: [phone] Relation: Spouse Secondary Emergency Contact: Chance, Karam Mobile Phone: [phone] Relation: Daughter Interpreter needed? No  Code Status:  Goals of Care: Advanced Directive information Advanced Directives 05/01/2020  Does Patient Have a Medical Advance Directive? Yes  Type of Advance Directive [place]  Does patient want to make changes to medical advance directive? No - Patient declined  Copy of [place] in Chart? -      Chief Complaint  Patient presents with  . New Admit To SNF    New admission    HPI: Patient is a 86 y.o. female seen today for admission to SNF  [place] higher level of care.  Has history of hypertension, hypothyroidism, and GERD, left plantar basal cell cancer, Alzheimer's dementia with aphasia  Patient was living with her husband in [place].  Most of the history obtained from her daughter who was visiting from [place].  Her daughter said that patient was having increasing problems with her memory.  Recently she was unable to take care of her ADLs.  Including needing help with dressing going to the bathroom.  Reminding of for eating.  Patient is walking without any cane or walker.  Has not had any falls.  Has not lost any weight.  No history of any agitation or aggression Patient was unable to give me much history.  She has severe aphasia.  Unable to name any objects  Past Medical History:  Diagnosis Date  . Barrett's esophagus   . Dementia  ([place])   . Diverticulosis   . Esophageal stricture   . GERD (gastroesophageal reflux disease)   . History of colonic polyps   . History of hysterectomy   . Hypertension   . Hypothyroidism   . Internal hemorrhoids   . Kidney stone    pt denies 03/17/14  . Memory difficulty 10/17/2015  . Primary progressive aphasia ([place]) 01/22/2018   Past Surgical History:  Procedure Laterality Date  . APPENDECTOMY     with hysterectomy  . BREAST EXCISIONAL BIOPSY     right x 2 1962 and 1982  . THYROIDECTOMY, PARTIAL    . TONSILLECTOMY      reports that she quit smoking about 32 years ago. Her smoking use included cigarettes. She started smoking about 71 years ago. She has a 38.00 pack-year smoking history. She has never used smokeless tobacco. She reports current alcohol use of about 7.0 standard drinks of alcohol per week. She reports that she does not use drugs. Social History   Socioeconomic History  . Marital status: Married    Spouse name: Vern  . Number of children: 1  . Years of education: masters  . Highest education level: Not on file  Occupational History  . Occupation: retired  Tobacco Use  . Smoking status: Former Smoker    Packs/day: 1.00    Years: 38.00    Pack years: 38.00    Types: Cigarettes    Start date: 08/25/1948  Quit date: 08/10/1987    Years since quitting: 32.7  . Smokeless tobacco: Never Used  Vaping Use  . Vaping Use: Never used  Substance and Sexual Activity  . Alcohol use: Yes    Alcohol/week: 7.0 standard drinks    Types: 7 Glasses of wine per week  . Drug use: No  . Sexual activity: Not Currently  Other Topics Concern  . Not on file  Social History Narrative   Married   Patient drinks about 3 cups of caffeine daily.   Patient is left handed but can use her right hand.         Diet:       Do you drink/ eat things with caffeine? Coffee      Marital status:  Married                             What year were you married ?  1959-1982+1988-present        Do you live in a house, apartment,assistred living, condo, trailer, etc.)? Apartment 1st floor      Is it one or more stories? 4 stories      How many persons live in your home ? 2      Do you have any pets in your home ?(please list)   No      Highest Level of education completed:  Masters      Current or past profession: [employer]       Do you exercise?    Yes                          Type & how often b Walk every day weather permitting       ADVANCED DIRECTIVES (Please bring copies)      Do you have a living will? YES      Do you have a DNR form?  YES                     If not, do you want to discuss one?       Do you have signed POA?HPOA forms?  YES               If so, please bring to your appointment      FUNCTIONAL STATUS- To be completed by Spouse / child / Staff       Do you have difficulty bathing or dressing yourself ?   No      Do you have difficulty preparing food or eating ?  Yes      Do you have difficulty managing your mediation ?  Yes      Do you have difficulty managing your finances ?  Yes      Do you have difficulty affording your medication ?  No      Social Determinants of Health   Financial Resource Strain:   . Difficulty of Paying Living Expenses: Not on file  Food Insecurity:   . Worried About Charity fundraiser in the Last Year: Not on file  . Ran Out of Food in the Last Year: Not on file  Transportation Needs:   . Lack of Transportation (Medical): Not on file  . Lack of Transportation (Non-Medical): Not on file  Physical Activity:   . Days of Exercise per Week: Not on file  . Minutes of Exercise  per Session: Not on file  Stress:   . Feeling of Stress : Not on file  Social Connections:   . Frequency of Communication with Friends and Family: Not on file  . Frequency of Social Gatherings with Friends and Family: Not on file  . Attends Religious Services: Not on file  . Active Member of Clubs or Organizations:  Not on file  . Attends Archivist Meetings: Not on file  . Marital Status: Not on file  Intimate Partner Violence:   . Fear of Current or Ex-Partner: Not on file  . Emotionally Abused: Not on file  . Physically Abused: Not on file  . Sexually Abused: Not on file    Functional Status Survey:    Family History  Problem Relation Age of Onset  . Stroke Brother   . Emphysema Father   . Heart attack Mother   . Dementia Mother   . Dementia Sister   . Dementia Sister   . Cancer Sister   . Colon cancer Neg Hx     Health Maintenance  Topic Date Due  . TETANUS/TDAP  Never done  . DEXA SCAN  Never done  . PNA vac Low Risk Adult (2 of 2 - PCV13) 08/03/2014  . INFLUENZA VACCINE  03/25/2020  . COVID-19 Vaccine  Completed    Allergies  Allergen Reactions  . Penicillins Nausea Only    Allergies as of 05/01/2020      Reactions   Penicillins Nausea Only      Medication List       Accurate as of May 01, 2020  3:37 PM. If you have any questions, ask your nurse or doctor.        STOP taking these medications   omeprazole 20 MG capsule Commonly known as: PRILOSEC Stopped by: [name], MD     TAKE these medications   atenolol 50 MG tablet Commonly known as: TENORMIN Take 25 mg by mouth daily.   cholecalciferol 1000 units tablet Commonly known as: VITAMIN D Take 1,000 Units by mouth daily.   donepezil 5 MG tablet Commonly known as: ARICEPT Take 1 tablet (5 mg total) by mouth 2 (two) times daily.   ICaps MV Tabs Take 2 tablets by mouth daily. At 3:00pm   levothyroxine 50 MCG tablet Commonly known as: SYNTHROID Take by mouth daily. 50mg  Monday thru Friday. Saturday and Sunday 25mg  (half tablet)   losartan 50 MG tablet Commonly known as: COZAAR Take 50 mg by mouth 2 (two) times daily. Morning and at 3:00pm   memantine 10 MG tablet Commonly known as: Namenda Take 1 tablet (10 mg total) by mouth 2 (two) times daily.       Review of Systems   Unable to perform ROS: Dementia    Vitals:   05/01/20 1531  BP: (!) 130/58  Pulse: 68  Resp: 18  Temp: 98.4 F (36.9 C)  SpO2: 97%  Weight: 98 lb 8 oz (44.7 kg)  Height: 4\' 10"  (1.473 m)   Body mass index is 20.59 kg/m. Physical Exam  Constitutional: . Well-developed and well-nourished.  HENT:  Head: Normocephalic.  Mouth/Throat: Oropharynx is clear and moist.  Eyes: Pupils are equal, round, and reactive to light.  Neck: Neck supple.  Cardiovascular: Normal rate and normal heart sounds.  No murmur heard. Pulmonary/Chest: Effort normal and breath sounds normal. No respiratory distress. No wheezes. She has no rales.  Abdominal: Soft. Bowel sounds are normal. No distension. There is no tenderness. There  is no rebound.  Musculoskeletal: No edema.  Lymphadenopathy: none Neurological: Gait seems Stable Alert but has Aphasia Skin: Skin is warm and dry.  Psychiatric: Normal mood and affect. Behavior is normal. Thought content normal.   Labs reviewed: Basic Metabolic Panel: No results for input(s): NA, K, CL, CO2, GLUCOSE, BUN, CREATININE, CALCIUM, MG, PHOS in the last 8760 hours. Liver Function Tests: No results for input(s): AST, ALT, ALKPHOS, BILITOT, PROT, ALBUMIN in the last 8760 hours. No results for input(s): LIPASE, AMYLASE in the last 8760 hours. No results for input(s): AMMONIA in the last 8760 hours. CBC: No results for input(s): WBC, NEUTROABS, HGB, HCT, MCV, PLT in the last 8760 hours. Cardiac Enzymes: No results for input(s): CKTOTAL, CKMB, CKMBINDEX, TROPONINI in the last 8760 hours. BNP: Invalid input(s): POCBNP No results found for: HGBA1C No results found for: TSH Lab Results  Component Value Date   VITAMINB12 909 10/17/2015   No results found for: FOLATE No results found for: IRON, TIBC, FERRITIN  Imaging and Procedures obtained prior to SNF admission: No results found.  Assessment/Plan  Hypertension, unspecified type Continue on Tenormin and  Cozaar Alzheimer's dementia without behavioral disturbance, unspecified timing of dementia onset ([place]) Continue on Aricept and Namenda Daughter wanted to know if it would be okay to not follow with neurology. Lat MMSE 1 year ago was 16/30 I think it would be hard to do MMSE due to her Aphasia I agreed we can follow her here and send her to neurology if needed POA is her Husband Gastroesophageal reflux disease without esophagitis Not on any medications at this time Hypothyroidism, unspecified type Continue Synthroid Basal cell carcinoma of foot, left Healed with just callus   Family/ staff Communication:   Labs/tests ordered: CBC,CMP,TSH   Total time spent in this patient care encounter was 45 _  minutes; greater than 50% of the visit spent counseling patient daughter and staff, reviewing records , Labs and coordinating care for problems addressed at this encounter.

## 2020-05-03 DIAGNOSIS — M6281 Muscle weakness (generalized): Secondary | ICD-10-CM | POA: Diagnosis not present

## 2020-05-03 DIAGNOSIS — Z7389 Other problems related to life management difficulty: Secondary | ICD-10-CM | POA: Diagnosis not present

## 2020-05-03 DIAGNOSIS — R1313 Dysphagia, pharyngeal phase: Secondary | ICD-10-CM | POA: Diagnosis not present

## 2020-05-03 DIAGNOSIS — J301 Allergic rhinitis due to pollen: Secondary | ICD-10-CM | POA: Diagnosis not present

## 2020-05-03 DIAGNOSIS — I10 Essential (primary) hypertension: Secondary | ICD-10-CM | POA: Diagnosis not present

## 2020-05-03 DIAGNOSIS — H3530 Unspecified macular degeneration: Secondary | ICD-10-CM | POA: Diagnosis not present

## 2020-05-03 DIAGNOSIS — K219 Gastro-esophageal reflux disease without esophagitis: Secondary | ICD-10-CM | POA: Diagnosis not present

## 2020-05-03 DIAGNOSIS — G309 Alzheimer's disease, unspecified: Secondary | ICD-10-CM | POA: Diagnosis not present

## 2020-05-03 DIAGNOSIS — E039 Hypothyroidism, unspecified: Secondary | ICD-10-CM | POA: Diagnosis not present

## 2020-05-03 DIAGNOSIS — R41841 Cognitive communication deficit: Secondary | ICD-10-CM | POA: Diagnosis not present

## 2020-05-03 DIAGNOSIS — E559 Vitamin D deficiency, unspecified: Secondary | ICD-10-CM | POA: Diagnosis not present

## 2020-05-03 LAB — COMPREHENSIVE METABOLIC PANEL
Albumin: 3.9 (ref 3.5–5.0)
Calcium: 9 (ref 8.7–10.7)
Globulin: 2.5

## 2020-05-03 LAB — HEPATIC FUNCTION PANEL
ALT: 11 (ref 7–35)
AST: 13 (ref 13–35)
Alkaline Phosphatase: 70 (ref 25–125)
Bilirubin, Total: 0.7

## 2020-05-03 LAB — TSH: TSH: 0.46 (ref 0.41–5.90)

## 2020-05-03 LAB — BASIC METABOLIC PANEL
BUN: 15 (ref 4–21)
CO2: 26 — AB (ref 13–22)
Chloride: 103 (ref 99–108)
Creatinine: 1 (ref 0.5–1.1)
Glucose: 98
Potassium: 4.2 (ref 3.4–5.3)
Sodium: 137 (ref 137–147)

## 2020-05-03 LAB — CBC AND DIFFERENTIAL
HCT: 35 — AB (ref 36–46)
Hemoglobin: 11.8 — AB (ref 12.0–16.0)
Neutrophils Absolute: 4860
Platelets: 219 (ref 150–399)
WBC: 7.5

## 2020-05-03 LAB — CBC: RBC: 3.8 — AB (ref 3.87–5.11)

## 2020-05-28 ENCOUNTER — Non-Acute Institutional Stay (SKILLED_NURSING_FACILITY): Payer: PPO | Admitting: Nurse Practitioner

## 2020-05-28 ENCOUNTER — Encounter: Payer: Self-pay | Admitting: Nurse Practitioner

## 2020-05-28 DIAGNOSIS — F0280 Dementia in other diseases classified elsewhere without behavioral disturbance: Secondary | ICD-10-CM | POA: Diagnosis not present

## 2020-05-28 DIAGNOSIS — E039 Hypothyroidism, unspecified: Secondary | ICD-10-CM | POA: Diagnosis not present

## 2020-05-28 DIAGNOSIS — R41841 Cognitive communication deficit: Secondary | ICD-10-CM | POA: Diagnosis not present

## 2020-05-28 DIAGNOSIS — G309 Alzheimer's disease, unspecified: Secondary | ICD-10-CM

## 2020-05-28 DIAGNOSIS — C44719 Basal cell carcinoma of skin of left lower limb, including hip: Secondary | ICD-10-CM | POA: Diagnosis not present

## 2020-05-28 DIAGNOSIS — I10 Essential (primary) hypertension: Secondary | ICD-10-CM

## 2020-05-28 DIAGNOSIS — J301 Allergic rhinitis due to pollen: Secondary | ICD-10-CM | POA: Diagnosis not present

## 2020-05-28 DIAGNOSIS — K219 Gastro-esophageal reflux disease without esophagitis: Secondary | ICD-10-CM

## 2020-05-28 DIAGNOSIS — R1313 Dysphagia, pharyngeal phase: Secondary | ICD-10-CM | POA: Diagnosis not present

## 2020-05-28 DIAGNOSIS — E559 Vitamin D deficiency, unspecified: Secondary | ICD-10-CM | POA: Diagnosis not present

## 2020-05-28 DIAGNOSIS — H3530 Unspecified macular degeneration: Secondary | ICD-10-CM | POA: Diagnosis not present

## 2020-05-28 NOTE — Assessment & Plan Note (Signed)
Continue Vit D supplement.  

## 2020-05-28 NOTE — Assessment & Plan Note (Signed)
Continue Donepezil, Memantine, prn Neurology. Continue SNF [facility] for safety, care assistance.

## 2020-05-28 NOTE — Progress Notes (Signed)
Location:   SNF [facility] Room Number: 28 Place of Service:  SNF (31) Provider: [facility] Christina Mamone NP  Christina Dad, MD  Patient Care Team: Christina Dad, MD as PCP - General (Internal Medicine)  Extended Emergency Contact Information Primary Emergency Contact: [facility] Address: [address]          [place], [place] [address] [place] Phone: [phone] Relation: Spouse Secondary Emergency Contact: Christina, Camacho Mobile Phone: [phone] Relation: Daughter Interpreter needed? No  Code Status: DNR Goals of care: Advanced Directive information Advanced Directives 05/01/2020  Does Patient Have a Medical Advance Directive? Yes  Type of Advance Directive [place]  Does patient want to make changes to medical advance directive? No - Patient declined  Copy of [place] in Chart? -     Chief Complaint  Patient presents with  . Acute Visit    Acid reflux    HPI:  Pt is a 86 y.o. female seen today for an acute visit for Reported 05/24/20 by ST sign of reflux after liquids including coughing, clear breath sounds immediately after swallowing and for a few minutes after which points to reflux vs dysphagia. The patient admitted heart burns sometimes.                HTN, takes Atenolol, Losartan             Vit D deficiency, takes Vit D             Dementia, takes Donepezil, Memantine, last seen neurology 12/12/19             Hypothyroidism, takes Levothyroxine, TSH 0.46 05/03/20             GERD, off Omeprazole.                    Past Medical History:  Diagnosis Date  . Barrett's esophagus   . Dementia ([place])   . Diverticulosis   . Esophageal stricture   . GERD (gastroesophageal reflux disease)   . History of colonic polyps   . History of hysterectomy   . Hypertension   . Hypothyroidism   . Internal hemorrhoids   . Kidney stone    pt denies 03/17/14  . Memory difficulty  10/17/2015  . Primary progressive aphasia ([place]) 01/22/2018   Past Surgical History:  Procedure Laterality Date  . APPENDECTOMY     with hysterectomy  . BREAST EXCISIONAL BIOPSY     right x 2 1962 and 1982  . THYROIDECTOMY, PARTIAL    . TONSILLECTOMY      Allergies  Allergen Reactions  . Penicillins Nausea Only    Allergies as of 05/28/2020      Reactions   Penicillins Nausea Only      Medication List       Accurate as of May 28, 2020 11:59 PM. If you have any questions, ask your nurse or doctor.        atenolol 50 MG tablet Commonly known as: TENORMIN Take 25 mg by mouth daily.   cholecalciferol 1000 units tablet Commonly known as: VITAMIN D Take 1,000 Units by mouth daily.   donepezil 5 MG tablet Commonly known as: ARICEPT Take 1 tablet (5 mg total) by mouth 2 (two) times daily.   ICaps MV Tabs Take 2 tablets by mouth daily. At 3:00pm   levothyroxine 25 MCG tablet  Commonly known as: SYNTHROID Take 25 mcg by mouth. Saturday and Sunday   levothyroxine 50 MCG tablet Commonly known as: SYNTHROID Take by mouth daily. 50mg  Monday thru Friday. Saturday and Sunday 25mg  (half tablet)   losartan 50 MG tablet Commonly known as: COZAAR Take 50 mg by mouth 2 (two) times daily. Morning and at 3:00pm   memantine 10 MG tablet Commonly known as: Namenda Take 1 tablet (10 mg total) by mouth 2 (two) times daily.   omeprazole 20 MG capsule Commonly known as: PRILOSEC Take 20 mg by mouth daily.       Review of Systems  Constitutional: Negative for appetite change, fatigue and fever.  HENT: Positive for hearing loss. Negative for congestion and voice change.   Eyes: Negative for visual disturbance.  Respiratory: Negative for cough and shortness of breath.   Cardiovascular: Negative for leg swelling.  Gastrointestinal: Negative for abdominal pain.       Heart burns sometimes.   Genitourinary: Negative for difficulty urinating, dysuria and urgency.    Musculoskeletal: Positive for gait problem.  Skin: Negative for color change.  Neurological: Positive for speech difficulty. Negative for dizziness, weakness and light-headedness.       Memory lapses. Difficulty wards finding.   Psychiatric/Behavioral: Negative for behavioral problems and sleep disturbance. The patient is not nervous/anxious.     Immunization History  Administered Date(s) Administered  . Moderna SARS-COVID-2 Vaccination 08/26/2019, 09/26/2019  . Pneumococcal Polysaccharide-23 08/03/2013  . Zoster Recombinat (Shingrix) 01/06/2018, 03/11/2018   Pertinent  Health Maintenance Due  Topic Date Due  . DEXA SCAN  Never done  . PNA vac Low Risk Adult (2 of 2 - PCV13) 08/03/2014  . INFLUENZA VACCINE  03/25/2020   Fall Risk  01/22/2018 01/08/2017  Falls in the past year? No No   Functional Status Survey:    Vitals:   05/28/20 1446  BP: (!) 118/56  Pulse: 60  Resp: 20  Temp: 98 F (36.7 C)  SpO2: 96%  Weight: 102 lb (46.3 kg)  Height: 4\' 10"  (1.473 m)   Body mass index is 21.32 kg/m. Physical Exam Vitals reviewed.  Constitutional:      Appearance: Normal appearance. She is not ill-appearing.  HENT:     Head: Normocephalic and atraumatic.     Mouth/Throat:     Mouth: Mucous membranes are moist.  Eyes:     Extraocular Movements: Extraocular movements intact.     Conjunctiva/sclera: Conjunctivae normal.     Pupils: Pupils are equal, round, and reactive to light.  Cardiovascular:     Rate and Rhythm: Normal rate and regular rhythm.     Heart sounds: No murmur heard.   Pulmonary:     Effort: Pulmonary effort is normal.     Breath sounds: No rales.  Abdominal:     General: Bowel sounds are normal.     Palpations: Abdomen is soft.     Tenderness: There is no abdominal tenderness. There is no guarding.  Musculoskeletal:     Cervical back: Normal range of motion and neck supple.     Right lower leg: No edema.     Left lower leg: No edema.  Skin:     General: Skin is warm and dry.  Neurological:     General: No focal deficit present.     Mental Status: She is alert. Mental status is at baseline.     Comments: Oriented to person, place.   Psychiatric:        Mood and  Affect: Mood normal.        Behavior: Behavior normal.        Thought Content: Thought content normal.     Labs reviewed: Recent Labs    05/03/20 0000  NA 137  K 4.2  CL 103  CO2 26*  BUN 15  CREATININE 1.0  CALCIUM 9.0   Recent Labs    05/03/20 0000  AST 13  ALT 11  ALKPHOS 70  ALBUMIN 3.9   Recent Labs    05/03/20 0000  WBC 7.5  NEUTROABS 4,860  HGB 11.8*  HCT 35*  PLT 219   Lab Results  Component Value Date   TSH 0.46 05/03/2020   No results found for: HGBA1C No results found for: CHOL, HDL, LDLCALC, LDLDIRECT, TRIG, CHOLHDL  Significant Diagnostic Results in last 30 days:  No results found.  Assessment/Plan: GERD Reported 05/24/20 by ST sign of reflux after liquids including coughing, clear breath sounds immediately after swallowing and for a few minutes after which points to reflux vs dysphagia. The patient admitted heart burns sometimes. Will try Omeprazole 20mg  qd. Observe.   HTN (hypertension) Blood pressure is controlled, continue Atenolol, Losartan  Hypothyroidism Stable, TSH 0.46 05/03/20, continue Levothyroxine.   Alzheimer's dementia ([place]) Continue Donepezil, Memantine, prn Neurology. Continue SNF [facility] for safety, care assistance.   Vitamin D deficiency Continue Vit D supplement.     Family/ staff Communication: plan of care reviewed with the patient and charge nurse.   Labs/tests ordered:  None  Time spend 35 minutes.

## 2020-05-28 NOTE — Assessment & Plan Note (Addendum)
Reported 05/24/20 by ST sign of reflux after liquids including coughing, clear breath sounds immediately after swallowing and for a few minutes after which points to reflux vs dysphagia. The patient admitted heart burns sometimes. Will try Omeprazole 20mg  qd. Observe.

## 2020-05-28 NOTE — Assessment & Plan Note (Signed)
Blood pressure is controlled, continue Atenolol, Losartan

## 2020-05-28 NOTE — Assessment & Plan Note (Signed)
Stable, TSH 0.46 05/03/20, continue Levothyroxine.

## 2020-05-29 ENCOUNTER — Encounter: Payer: Self-pay | Admitting: Nurse Practitioner

## 2020-06-07 DIAGNOSIS — H43813 Vitreous degeneration, bilateral: Secondary | ICD-10-CM | POA: Diagnosis not present

## 2020-06-07 DIAGNOSIS — Z961 Presence of intraocular lens: Secondary | ICD-10-CM | POA: Diagnosis not present

## 2020-06-07 DIAGNOSIS — H353122 Nonexudative age-related macular degeneration, left eye, intermediate dry stage: Secondary | ICD-10-CM | POA: Diagnosis not present

## 2020-06-07 DIAGNOSIS — H353212 Exudative age-related macular degeneration, right eye, with inactive choroidal neovascularization: Secondary | ICD-10-CM | POA: Diagnosis not present

## 2020-06-13 ENCOUNTER — Non-Acute Institutional Stay (SKILLED_NURSING_FACILITY): Payer: PPO | Admitting: Nurse Practitioner

## 2020-06-13 ENCOUNTER — Encounter: Payer: Self-pay | Admitting: Nurse Practitioner

## 2020-06-13 DIAGNOSIS — F0280 Dementia in other diseases classified elsewhere without behavioral disturbance: Secondary | ICD-10-CM

## 2020-06-13 DIAGNOSIS — K219 Gastro-esophageal reflux disease without esophagitis: Secondary | ICD-10-CM

## 2020-06-13 DIAGNOSIS — E559 Vitamin D deficiency, unspecified: Secondary | ICD-10-CM | POA: Diagnosis not present

## 2020-06-13 DIAGNOSIS — I10 Essential (primary) hypertension: Secondary | ICD-10-CM

## 2020-06-13 DIAGNOSIS — G309 Alzheimer's disease, unspecified: Secondary | ICD-10-CM

## 2020-06-13 DIAGNOSIS — E039 Hypothyroidism, unspecified: Secondary | ICD-10-CM

## 2020-06-13 NOTE — Assessment & Plan Note (Signed)
Better since resumed Omeprazole.

## 2020-06-13 NOTE — Assessment & Plan Note (Signed)
Dementia, takes Donepezil, Memantine, last seen neurology 12/12/19. No behavioral issues.

## 2020-06-13 NOTE — Assessment & Plan Note (Signed)
Continue Vit D, due for DEXA

## 2020-06-13 NOTE — Assessment & Plan Note (Signed)
Hypothyroidism, takes Levothyroxine, TSH 0.46 05/03/20

## 2020-06-13 NOTE — Progress Notes (Signed)
Location:  [place] Room Number: 56 Place of Service:  SNF [phone]) Provider:  Leafy Kindle, NP  [name], MD  Patient Care Team: [name], MD as PCP - General (Internal Medicine)  Extended Emergency Contact Information Primary Emergency Contact: [facility] Address: [address]          [place], [place] [address] [place] Phone: [phone] Relation: Spouse Secondary Emergency Contact: Caran, [place] Mobile Phone: [phone] Relation: Daughter Interpreter needed? No  Code Status:  DNR Goals of care: Advanced Directive information Advanced Directives 06/13/2020  Does Patient Have a Medical Advance Directive? Yes  Type of Paramedic of [place];Out of facility DNR (pink MOST or yellow form)  Does patient want to make changes to medical advance directive? No - Patient declined  Copy of [place] in Chart? Yes - validated most recent copy scanned in chart (See row information)  Pre-existing out of facility DNR order (yellow form or pink MOST form) Pink MOST form placed in chart (order not valid for inpatient use)     Chief Complaint  Patient presents with  . Medical Management of Chronic Issues    Routine follow up visit  . Best Practice Recommendations    Tetans/Tdap,Pneumonia  . Quality Metric Gaps    DEXA scan    HPI:  Pt is a 86 y.o. female seen today for medical management of chronic diseases.    HTN, takes Atenolol, Losartan Vit D deficiency, takes Vit D Dementia, takes Donepezil, Memantine, last seen neurology 12/12/19 Hypothyroidism, takes Levothyroxine, TSH 0.46 05/03/20 GERD, resumed Omeprazole.   Past Medical History:  Diagnosis Date  . Barrett's esophagus   . Dementia ([place])   . Diverticulosis   . Esophageal stricture   . GERD (gastroesophageal reflux disease)   . History of  colonic polyps   . History of hysterectomy   . Hypertension   . Hypothyroidism   . Internal hemorrhoids   . Kidney stone    pt denies 03/17/14  . Memory difficulty 10/17/2015  . Primary progressive aphasia ([place]) 01/22/2018   Past Surgical History:  Procedure Laterality Date  . APPENDECTOMY     with hysterectomy  . BREAST EXCISIONAL BIOPSY     right x 2 1962 and 1982  . THYROIDECTOMY, PARTIAL    . TONSILLECTOMY      Allergies  Allergen Reactions  . Penicillins Nausea Only    Allergies as of 06/13/2020      Reactions   Penicillins Nausea Only      Medication List       Accurate as of June 13, 2020 11:59 PM. If you have any questions, ask your nurse or doctor.        atenolol 25 MG tablet Commonly known as: TENORMIN Take 25 mg by mouth daily.   cholecalciferol 1000 units tablet Commonly known as: VITAMIN D Take 1,000 Units by mouth daily.   donepezil 5 MG tablet Commonly known as: ARICEPT Take 1 tablet (5 mg total) by mouth 2 (two) times daily.   ICaps MV Tabs Take 2 tablets by mouth daily. At 3:00pm   levothyroxine 25 MCG tablet Commonly known as: SYNTHROID Take 25 mcg by mouth. Saturday and Sunday   levothyroxine 50 MCG tablet Commonly known as: SYNTHROID Take by mouth daily. 50mg  Monday thru Friday. Saturday and Sunday 25mg  (half tablet)   losartan 50 MG  tablet Commonly known as: COZAAR Take 50 mg by mouth 2 (two) times daily. Morning and at 3:00pm   memantine 10 MG tablet Commonly known as: Namenda Take 1 tablet (10 mg total) by mouth 2 (two) times daily.   omeprazole 20 MG capsule Commonly known as: PRILOSEC Take 20 mg by mouth daily.       Review of Systems  Constitutional: Negative for fatigue, fever and unexpected weight change.  HENT: Positive for hearing loss. Negative for congestion and voice change.   Eyes: Negative for visual disturbance.  Respiratory: Negative for cough and shortness of breath.   Cardiovascular: Negative for  leg swelling.  Gastrointestinal: Negative for abdominal pain, constipation, nausea and vomiting.  Genitourinary: Negative for difficulty urinating, dysuria and urgency.  Musculoskeletal: Positive for gait problem.  Skin: Negative for color change.  Neurological: Positive for speech difficulty. Negative for dizziness, weakness and light-headedness.       Memory lapses. Difficulty wards finding.   Psychiatric/Behavioral: Negative for behavioral problems and sleep disturbance. The patient is not nervous/anxious.     Immunization History  Administered Date(s) Administered  . Influenza-Unspecified 06/06/2020  . Moderna SARS-COVID-2 Vaccination 08/26/2019, 09/26/2019  . Pneumococcal Polysaccharide-23 08/03/2013  . Zoster Recombinat (Shingrix) 01/06/2018, 03/11/2018   Pertinent  Health Maintenance Due  Topic Date Due  . DEXA SCAN  Never done  . PNA vac Low Risk Adult (2 of 2 - PCV13) 08/03/2014  . INFLUENZA VACCINE  Completed   Fall Risk  01/22/2018 01/08/2017  Falls in the past year? No No   Functional Status Survey:    Vitals:   06/13/20 1041  BP: 128/66  Pulse: 68  Resp: 20  Temp: 97.8 F (36.6 C)  SpO2: 95%  Weight: 102 lb (46.3 kg)  Height: 4\' 10"  (1.473 m)   Body mass index is 21.32 kg/m. Physical Exam Vitals reviewed.  Constitutional:      Appearance: Normal appearance.  HENT:     Head: Normocephalic and atraumatic.     Mouth/Throat:     Mouth: Mucous membranes are moist.  Eyes:     Extraocular Movements: Extraocular movements intact.     Conjunctiva/sclera: Conjunctivae normal.     Pupils: Pupils are equal, round, and reactive to light.  Cardiovascular:     Rate and Rhythm: Normal rate and regular rhythm.     Heart sounds: No murmur heard.   Pulmonary:     Effort: Pulmonary effort is normal.     Breath sounds: No rales.  Abdominal:     General: Bowel sounds are normal.     Palpations: Abdomen is soft.     Tenderness: There is no abdominal tenderness.    Musculoskeletal:     Cervical back: Normal range of motion and neck supple.     Right lower leg: No edema.     Left lower leg: No edema.  Skin:    General: Skin is warm and dry.  Neurological:     General: No focal deficit present.     Mental Status: She is alert. Mental status is at baseline.     Comments: Oriented to person, place.   Psychiatric:        Mood and Affect: Mood normal.        Behavior: Behavior normal.        Thought Content: Thought content normal.     Labs reviewed: Recent Labs    05/03/20 0000  NA 137  K 4.2  CL 103  CO2 26*  BUN 15  CREATININE 1.0  CALCIUM 9.0   Recent Labs    05/03/20 0000  AST 13  ALT 11  ALKPHOS 70  ALBUMIN 3.9   Recent Labs    05/03/20 0000  WBC 7.5  NEUTROABS 4,860  HGB 11.8*  HCT 35*  PLT 219   Lab Results  Component Value Date   TSH 0.46 05/03/2020   No results found for: HGBA1C No results found for: CHOL, HDL, LDLCALC, LDLDIRECT, TRIG, CHOLHDL  Significant Diagnostic Results in last 30 days:  No results found.  Assessment/Plan HTN (hypertension) Blood pressure is controlled, continue Atenolol, Losartan  Vitamin D deficiency Continue Vit D, due for DEXA  Alzheimer's dementia ([place]) Dementia, takes Donepezil, Memantine, last seen neurology 12/12/19. No behavioral issues.    Hypothyroidism Hypothyroidism, takes Levothyroxine, TSH 0.46 05/03/20   GERD Better since resumed Omeprazole.      Family/ staff Communication: plan of care reviewed with the patient and charge nurse.   Labs/tests ordered:  none  Time spend 35 minutes.

## 2020-06-13 NOTE — Assessment & Plan Note (Signed)
Blood pressure is controlled, continue Atenolol, Losartan

## 2020-06-14 ENCOUNTER — Encounter: Payer: Self-pay | Admitting: Nurse Practitioner

## 2020-06-18 ENCOUNTER — Ambulatory Visit: Payer: PPO | Admitting: Neurology

## 2020-06-25 DIAGNOSIS — J301 Allergic rhinitis due to pollen: Secondary | ICD-10-CM | POA: Diagnosis not present

## 2020-06-25 DIAGNOSIS — R1313 Dysphagia, pharyngeal phase: Secondary | ICD-10-CM | POA: Diagnosis not present

## 2020-06-25 DIAGNOSIS — G309 Alzheimer's disease, unspecified: Secondary | ICD-10-CM | POA: Diagnosis not present

## 2020-06-25 DIAGNOSIS — C44719 Basal cell carcinoma of skin of left lower limb, including hip: Secondary | ICD-10-CM | POA: Diagnosis not present

## 2020-06-25 DIAGNOSIS — E559 Vitamin D deficiency, unspecified: Secondary | ICD-10-CM | POA: Diagnosis not present

## 2020-06-25 DIAGNOSIS — R41841 Cognitive communication deficit: Secondary | ICD-10-CM | POA: Diagnosis not present

## 2020-06-25 DIAGNOSIS — H3530 Unspecified macular degeneration: Secondary | ICD-10-CM | POA: Diagnosis not present

## 2020-06-25 DIAGNOSIS — F0280 Dementia in other diseases classified elsewhere without behavioral disturbance: Secondary | ICD-10-CM | POA: Diagnosis not present

## 2020-06-25 DIAGNOSIS — K219 Gastro-esophageal reflux disease without esophagitis: Secondary | ICD-10-CM | POA: Diagnosis not present

## 2020-07-11 ENCOUNTER — Non-Acute Institutional Stay (SKILLED_NURSING_FACILITY): Payer: PPO | Admitting: Nurse Practitioner

## 2020-07-11 ENCOUNTER — Encounter: Payer: Self-pay | Admitting: Nurse Practitioner

## 2020-07-11 DIAGNOSIS — E039 Hypothyroidism, unspecified: Secondary | ICD-10-CM | POA: Diagnosis not present

## 2020-07-11 DIAGNOSIS — I10 Essential (primary) hypertension: Secondary | ICD-10-CM

## 2020-07-11 DIAGNOSIS — K219 Gastro-esophageal reflux disease without esophagitis: Secondary | ICD-10-CM

## 2020-07-11 DIAGNOSIS — F0280 Dementia in other diseases classified elsewhere without behavioral disturbance: Secondary | ICD-10-CM

## 2020-07-11 DIAGNOSIS — E559 Vitamin D deficiency, unspecified: Secondary | ICD-10-CM | POA: Diagnosis not present

## 2020-07-11 DIAGNOSIS — G309 Alzheimer's disease, unspecified: Secondary | ICD-10-CM | POA: Diagnosis not present

## 2020-07-11 NOTE — Assessment & Plan Note (Signed)
Vit D deficiency, takes Vit D, due for DEXA

## 2020-07-11 NOTE — Assessment & Plan Note (Signed)
Hypothyroidism, takes Levothyroxine, TSH 0.46 05/03/20

## 2020-07-11 NOTE — Assessment & Plan Note (Signed)
Blood pressure is controlled, continue Atenolol, Losartan 

## 2020-07-11 NOTE — Assessment & Plan Note (Signed)
Dementia, takes Donepezil, Memantine, last seen neurology 12/12/19

## 2020-07-11 NOTE — Progress Notes (Signed)
Location:   [place] Room Number: 29 Place of Service:  SNF (31) Provider:  [place]colas Loeper, Lennie Odor NP  [name], MD  Patient Care Team: [name], MD as PCP - General (Internal Medicine)  Extended Emergency Contact Information Primary Emergency Contact: [facility] Address: [address]          [place], [place] [address] [place] Phone: [phone] Relation: Spouse Secondary Emergency Contact: [place], [place] Mobile Phone: [phone] Relation: Daughter Interpreter needed? No  Code Status:  DNR Goals of care: Advanced Directive information Advanced Directives 07/11/2020  Does Patient Have a Medical Advance Directive? Yes  Type of Advance Directive Out of facility DNR (pink MOST or yellow form);Healthcare Power of Attorney  Does patient want to make changes to medical advance directive? No - Patient declined  Copy of [place] in Chart? Yes - validated most recent copy scanned in chart (See row information)  Pre-existing out of facility DNR order (yellow form or pink MOST form) Pink MOST form placed in chart (order not valid for inpatient use)     Chief Complaint  Patient presents with   Medical Management of Chronic Issues   Health Maintenance    TDAP, Dexa scan, and PCV13    HPI:  Pt is a 86 y.o. female seen today for medical management of chronic diseases.    HTN, takes Atenolol, Losartan Vit D deficiency, takes Vit D, due for DEXA Dementia, takes Donepezil, Memantine, last seen neurology 12/12/19 Hypothyroidism, takes Levothyroxine, TSH 0.46 05/03/20 GERD,resumed Omeprazole.   Past Medical History:  Diagnosis Date   Barrett's esophagus    Dementia ([place])    Diverticulosis    Esophageal stricture    GERD (gastroesophageal reflux disease)    History of colonic polyps    History of hysterectomy     Hypertension    Hypothyroidism    Internal hemorrhoids    Kidney stone    pt denies 03/17/14   Memory difficulty 10/17/2015   Primary progressive aphasia ([place]) 01/22/2018   Past Surgical History:  Procedure Laterality Date   APPENDECTOMY     with hysterectomy   BREAST EXCISIONAL BIOPSY     right x 2 1962 and 1982   THYROIDECTOMY, PARTIAL     TONSILLECTOMY      Allergies  Allergen Reactions   Penicillins Nausea Only    Allergies as of 07/11/2020      Reactions   Penicillins Nausea Only      Medication List       Accurate as of July 11, 2020  1:46 PM. If you have any questions, ask your nurse or doctor.        atenolol 25 MG tablet Commonly known as: TENORMIN Take 25 mg by mouth daily.   cholecalciferol 1000 units tablet Commonly known as: VITAMIN D Take 1,000 Units by mouth daily.   donepezil 5 MG tablet Commonly known as: ARICEPT Take 1 tablet (5 mg total) by mouth 2 (two) times daily.   ICaps MV Tabs Take 2 tablets by mouth daily. At 3:00pm   levothyroxine 25 MCG tablet Commonly known as: SYNTHROID Take 25 mcg by mouth. Saturday and Sunday   levothyroxine 50 MCG tablet Commonly known as: SYNTHROID Take by mouth daily. 50mg  Monday thru Friday. Saturday and Sunday 25mg  (half tablet)   losartan 50 MG tablet Commonly known as: COZAAR Take 50 mg by mouth  2 (two) times daily. Morning and at 3:00pm   memantine 10 MG tablet Commonly known as: Namenda Take 1 tablet (10 mg total) by mouth 2 (two) times daily.   omeprazole 20 MG capsule Commonly known as: PRILOSEC Take 20 mg by mouth daily.       Review of Systems  Constitutional: Negative for fatigue, fever and unexpected weight change.  HENT: Positive for hearing loss. Negative for congestion and voice change.   Eyes: Negative for visual disturbance.  Respiratory: Negative for cough and shortness of breath.   Cardiovascular: Negative for leg swelling.  Gastrointestinal: Negative for  abdominal pain and constipation.  Genitourinary: Negative for difficulty urinating, dysuria and urgency.  Musculoskeletal: Positive for gait problem.  Skin: Negative for color change.  Neurological: Positive for speech difficulty. Negative for weakness, light-headedness and headaches.       Memory lapses. Difficulty wards finding.   Psychiatric/Behavioral: Negative for behavioral problems and sleep disturbance. The patient is not nervous/anxious.     Immunization History  Administered Date(s) Administered   Influenza-Unspecified 06/06/2020   Moderna SARS-COVID-2 Vaccination 08/26/2019, 09/26/2019   Pneumococcal Polysaccharide-23 08/03/2013   Zoster Recombinat (Shingrix) 01/06/2018, 03/11/2018   Pertinent  Health Maintenance Due  Topic Date Due   DEXA SCAN  Never done   PNA vac Low Risk Adult (2 of 2 - PCV13) 08/03/2014   INFLUENZA VACCINE  Completed   Fall Risk  01/22/2018 01/08/2017  Falls in the past year? No No   Functional Status Survey:    Vitals:   07/11/20 1042  BP: 132/64  Pulse: 75  Resp: 18  Temp: 98.4 F (36.9 C)  SpO2: 94%  Weight: 100 lb 6.4 oz (45.5 kg)  Height: 4\' 10"  (1.473 m)   Body mass index is 20.98 kg/m. Physical Exam Vitals reviewed.  Constitutional:      Appearance: Normal appearance.  HENT:     Head: Normocephalic and atraumatic.     Mouth/Throat:     Mouth: Mucous membranes are moist.  Eyes:     Conjunctiva/sclera: Conjunctivae normal.     Pupils: Pupils are equal, round, and reactive to light.  Cardiovascular:     Rate and Rhythm: Normal rate and regular rhythm.     Heart sounds: No murmur heard.   Pulmonary:     Effort: Pulmonary effort is normal.     Breath sounds: No rales.  Abdominal:     General: Bowel sounds are normal.     Palpations: Abdomen is soft.     Tenderness: There is no abdominal tenderness.  Musculoskeletal:     Cervical back: Normal range of motion and neck supple.     Right lower leg: No edema.      Left lower leg: No edema.  Skin:    General: Skin is warm and dry.  Neurological:     General: No focal deficit present.     Mental Status: She is alert. Mental status is at baseline.     Comments: Oriented to person, place.   Psychiatric:        Mood and Affect: Mood normal.        Behavior: Behavior normal.        Thought Content: Thought content normal.     Labs reviewed: Recent Labs    05/03/20 0000  NA 137  K 4.2  CL 103  CO2 26*  BUN 15  CREATININE 1.0  CALCIUM 9.0   Recent Labs    05/03/20 0000  AST 13  ALT 11  ALKPHOS 70  ALBUMIN 3.9   Recent Labs    05/03/20 0000  WBC 7.5  NEUTROABS 4,860  HGB 11.8*  HCT 35*  PLT 219   Lab Results  Component Value Date   TSH 0.46 05/03/2020   No results found for: HGBA1C No results found for: CHOL, HDL, LDLCALC, LDLDIRECT, TRIG, CHOLHDL  Significant Diagnostic Results in last 30 days:  No results found.  Assessment/Plan HTN (hypertension) Blood pressure is controlled, continue Atenolol, Losartan.   Vitamin D deficiency Vit D deficiency, takes Vit D, due for DEXA   Alzheimer's dementia ([place]) Dementia, takes Donepezil, Memantine, last seen neurology 12/12/19  Hypothyroidism Hypothyroidism, takes Levothyroxine, TSH 0.46 05/03/20   GERD Stable, continue Omeprazole.      Family/ staff Communication: plan of care reviewed with the patient and charge nurse.   Labs/tests ordered:  none  Time spend 35 minutes.

## 2020-07-11 NOTE — Assessment & Plan Note (Signed)
Stable, continue Omeprazole.  

## 2020-07-16 ENCOUNTER — Non-Acute Institutional Stay (SKILLED_NURSING_FACILITY): Payer: PPO | Admitting: Internal Medicine

## 2020-07-16 ENCOUNTER — Encounter: Payer: Self-pay | Admitting: Internal Medicine

## 2020-07-16 DIAGNOSIS — E039 Hypothyroidism, unspecified: Secondary | ICD-10-CM | POA: Diagnosis not present

## 2020-07-16 DIAGNOSIS — G309 Alzheimer's disease, unspecified: Secondary | ICD-10-CM

## 2020-07-16 DIAGNOSIS — R079 Chest pain, unspecified: Secondary | ICD-10-CM

## 2020-07-16 DIAGNOSIS — M546 Pain in thoracic spine: Secondary | ICD-10-CM

## 2020-07-16 DIAGNOSIS — F0280 Dementia in other diseases classified elsewhere without behavioral disturbance: Secondary | ICD-10-CM | POA: Diagnosis not present

## 2020-07-16 DIAGNOSIS — I10 Essential (primary) hypertension: Secondary | ICD-10-CM

## 2020-07-16 NOTE — Progress Notes (Addendum)
Location:   [place] Room Number: 29 Place of Service:  SNF [phone]) Provider:  Veleta Miners MD  [name], MD  Patient Care Team: [name], MD as PCP - General (Internal Medicine)  Extended Emergency Contact Information Primary Emergency Contact: [facility] Address: [address]          [place],  [address] [place] Phone: [phone] Relation: Spouse Secondary Emergency Contact: [place], [place] Mobile Phone: [phone] Relation: Daughter Interpreter needed? No  Code Status:  DNR Goals of care: Advanced Directive information Advanced Directives 07/11/2020  Does Patient Have a Medical Advance Directive? Yes  Type of Advance Directive Out of facility DNR (pink MOST or yellow form);Healthcare Power of Attorney  Does patient want to make changes to medical advance directive? No - Patient declined  Copy of [place] in Chart? Yes - validated most recent copy scanned in chart (See row information)  Pre-existing out of facility DNR order (yellow form or pink MOST form) Pink MOST form placed in chart (order not valid for inpatient use)     Chief Complaint  Patient presents with  . Acute Visit    Pain in back    HPI:  Pt is a 86 y.o. female seen today for an acute visit for Pain in her back  [place] level of care.  Has history of hypertension, hypothyroidism, and GERD, left plantar basal cell cancer, Alzheimer's dementia with aphasia  Very difficult to get history due to her aphasia.  But her husband was in the room and he helps with the history.  It looks like patient has been complaining of pain in her back and right side of her chest for last few days.  Does have some cough.  Not sure about the shortness of breath.  No falls or any kind of injuries.  No fever.  Eating well.  Nurses have been using Tylenol as needed to  help.  Patient is walking without any assist does not look in any acute distress  Past Medical History:  Diagnosis Date  . Barrett's esophagus   . Dementia ([place])   . Diverticulosis   . Esophageal stricture   . GERD (gastroesophageal reflux disease)   . History of colonic polyps   . History of hysterectomy   . Hypertension   . Hypothyroidism   . Internal hemorrhoids   . Kidney stone    pt denies 03/17/14  . Memory difficulty 10/17/2015  . Primary progressive aphasia ([place]) 01/22/2018   Past Surgical History:  Procedure Laterality Date  . APPENDECTOMY     with hysterectomy  . BREAST EXCISIONAL BIOPSY     right x 2 1962 and 1982  . THYROIDECTOMY, PARTIAL    . TONSILLECTOMY      Allergies  Allergen Reactions  . Penicillins Nausea Only    Allergies as of 07/16/2020      Reactions   Penicillins Nausea Only      Medication List       Accurate as of July 16, 2020 10:26 AM. If you have any questions, ask your nurse or doctor.        acetaminophen 325 MG tablet Commonly known as: TYLENOL Take 650 mg by mouth every 4 (four) hours as needed.   atenolol 25 MG tablet Commonly known as: TENORMIN Take 25 mg by mouth daily.  cholecalciferol 1000 units tablet Commonly known as: VITAMIN D Take 1,000 Units by mouth daily.   donepezil 5 MG tablet Commonly known as: ARICEPT Take 1 tablet (5 mg total) by mouth 2 (two) times daily.   ICaps MV Tabs Take 2 tablets by mouth daily. At 3:00pm   levothyroxine 25 MCG tablet Commonly known as: SYNTHROID Take 25 mcg by mouth. Saturday and Sunday   levothyroxine 50 MCG tablet Commonly known as: SYNTHROID Take by mouth daily. 50mg  Monday thru Friday. Saturday and Sunday 25mg  (half tablet)   losartan 50 MG tablet Commonly known as: COZAAR Take 50 mg by mouth 2 (two) times daily. Morning and at 3:00pm   memantine 10 MG tablet Commonly known as: Namenda Take 1 tablet (10 mg total) by mouth 2 (two) times daily.    omeprazole 20 MG capsule Commonly known as: PRILOSEC Take 20 mg by mouth daily.   PreviDent 1.1 % Gel dental gel Generic drug: sodium fluoride Place 1 application onto teeth at bedtime.       Review of Systems  Constitutional: Negative.   HENT: Negative.   Respiratory: Positive for cough.   Cardiovascular: Positive for chest pain.  Gastrointestinal: Negative.   Genitourinary: Negative.   Musculoskeletal: Positive for arthralgias and back pain.  Skin: Negative.   Neurological: Negative.   Psychiatric/Behavioral: Positive for confusion.    Immunization History  Administered Date(s) Administered  . Influenza-Unspecified 06/06/2020  . Moderna SARS-COVID-2 Vaccination 08/26/2019, 09/26/2019  . Pneumococcal Polysaccharide-23 08/03/2013  . Zoster Recombinat (Shingrix) 01/06/2018, 03/11/2018   Pertinent  Health Maintenance Due  Topic Date Due  . DEXA SCAN  Never done  . PNA vac Low Risk Adult (2 of 2 - PCV13) 08/03/2014  . INFLUENZA VACCINE  Completed   Fall Risk  01/22/2018 01/08/2017  Falls in the past year? No No   Functional Status Survey:    Vitals:   07/16/20 1018  BP: 130/70  Pulse: 76  Resp: 18  Temp: 97.8 F (36.6 C)  SpO2: 93%  Weight: 100 lb 6.4 oz (45.5 kg)  Height: 4\' 10"  (1.473 m)   Body mass index is 20.98 kg/m. Physical Exam Vitals reviewed.  Constitutional:      Appearance: Normal appearance.  HENT:     Head: Normocephalic.     Nose: Nose normal.     Mouth/Throat:     Mouth: Mucous membranes are moist.     Pharynx: Oropharynx is clear.  Eyes:     Pupils: Pupils are equal, round, and reactive to light.  Cardiovascular:     Rate and Rhythm: Normal rate and regular rhythm.     Pulses: Normal pulses.  Pulmonary:     Effort: Pulmonary effort is normal.     Breath sounds: No wheezing or rales.  Abdominal:     General: Abdomen is flat. Bowel sounds are normal.     Palpations: Abdomen is soft.  Musculoskeletal:        General: No  swelling.     Cervical back: Neck supple.     Comments: Point tenderness in Thoracic region and right Chest area  Skin:    General: Skin is warm and dry.  Neurological:     General: No focal deficit present.     Mental Status: She is alert and oriented to person, place, and time.  Psychiatric:        Mood and Affect: Mood normal.        Thought Content: Thought content normal.  Labs reviewed: Recent Labs    05/03/20 0000  NA 137  K 4.2  CL 103  CO2 26*  BUN 15  CREATININE 1.0  CALCIUM 9.0   Recent Labs    05/03/20 0000  AST 13  ALT 11  ALKPHOS 70  ALBUMIN 3.9   Recent Labs    05/03/20 0000  WBC 7.5  NEUTROABS 4,860  HGB 11.8*  HCT 35*  PLT 219   Lab Results  Component Value Date   TSH 0.46 05/03/2020   No results found for: HGBA1C No results found for: CHOL, HDL, LDLCALC, LDLDIRECT, TRIG, CHOLHDL  Significant Diagnostic Results in last 30 days:  No results found.  Assessment/Plan  Acute midline thoracic back pain Xray of Thoracic Spine Tylenol 650 mg TID Will try patches if can find the exact place Right-sided chest pain Chest Xray  Addendum Her Xray is Positive for T 5 Compression Fracture And also has Compression of L2 Vertebrae. Chest Xray is negative for acute process D/w Husband Will try Lidocaine patch. Therapy to evaluate and treat She was on ? Prolia by her Previous PCP Will get his records Will need repeat Bone Density also  Hypothyroidism, unspecified type TSH normal Hypertension, unspecified type On Cozaar Alzheimer's dementia without behavioral disturbance, unspecified timing of dementia onset ([place]) Continue Namenda and Aricept   Family/ staff Communication:   Labs/tests ordered:   Total time spent in this patient care encounter was 45 _  minutes; greater than 50% of the visit spent counseling patient and staff, reviewing records , Labs and coordinating care for problems addressed at this encounter.

## 2020-07-25 DIAGNOSIS — M546 Pain in thoracic spine: Secondary | ICD-10-CM | POA: Diagnosis not present

## 2020-07-25 DIAGNOSIS — R2681 Unsteadiness on feet: Secondary | ICD-10-CM | POA: Diagnosis not present

## 2020-07-30 ENCOUNTER — Non-Acute Institutional Stay (SKILLED_NURSING_FACILITY): Payer: PPO | Admitting: Internal Medicine

## 2020-07-30 ENCOUNTER — Encounter: Payer: Self-pay | Admitting: Internal Medicine

## 2020-07-30 DIAGNOSIS — I10 Essential (primary) hypertension: Secondary | ICD-10-CM

## 2020-07-30 DIAGNOSIS — K219 Gastro-esophageal reflux disease without esophagitis: Secondary | ICD-10-CM | POA: Diagnosis not present

## 2020-07-30 DIAGNOSIS — E039 Hypothyroidism, unspecified: Secondary | ICD-10-CM

## 2020-07-30 DIAGNOSIS — F0280 Dementia in other diseases classified elsewhere without behavioral disturbance: Secondary | ICD-10-CM | POA: Diagnosis not present

## 2020-07-30 DIAGNOSIS — S22000A Wedge compression fracture of unspecified thoracic vertebra, initial encounter for closed fracture: Secondary | ICD-10-CM

## 2020-07-30 DIAGNOSIS — E559 Vitamin D deficiency, unspecified: Secondary | ICD-10-CM | POA: Diagnosis not present

## 2020-07-30 DIAGNOSIS — G309 Alzheimer's disease, unspecified: Secondary | ICD-10-CM | POA: Diagnosis not present

## 2020-07-30 NOTE — Progress Notes (Signed)
Location:  [facility] Room Number: 69 Place of Service:  SNF [phone])  Provider: Veleta Miners MD  Code Status: Managed Care Goals of Care:  Advanced Directives 07/30/2020  Does Patient Have a Medical Advance Directive? Yes  Type of Paramedic of [place];Out of facility DNR (pink MOST or yellow form)  Does patient want to make changes to medical advance directive? No - Patient declined  Copy of [place] in Chart? Yes - validated most recent copy scanned in chart (See row information)  Pre-existing out of facility DNR order (yellow form or pink MOST form) Pink MOST form placed in chart (order not valid for inpatient use)     Chief Complaint  Patient presents with  . Medical Management of Chronic Issues  . Health Maintenance    TDAP, Dexa scan, PCV13    HPI: Patient is a 86 y.o. female seen today for medical management of chronic diseases.    LTC resident  Has history of hypertension, hypothyroidism, and GERD, left plantar basal cell cancer, Alzheimer's dementia with aphasia  Recent T 5 Compression Fracture and L2 Compression  Pain seems controlled on Tylenol and Lidocaine Fractures Walking with no assist Weight stable  No Other Nursing issues Has aphasia. Dependent for her ADLS. Husband comes and spends time with her      Past Medical History:  Diagnosis Date  . Barrett's esophagus   . Dementia ([place])   . Diverticulosis   . Esophageal stricture   . GERD (gastroesophageal reflux disease)   . History of colonic polyps   . History of hysterectomy   . Hypertension   . Hypothyroidism   . Internal hemorrhoids   . Kidney stone    pt denies 03/17/14  . Memory difficulty 10/17/2015  . Primary progressive aphasia ([place]) 01/22/2018    Past Surgical History:  Procedure Laterality Date  . APPENDECTOMY     with hysterectomy  . BREAST EXCISIONAL BIOPSY     right x 2 1962 and 1982  . THYROIDECTOMY, PARTIAL     . TONSILLECTOMY      Allergies  Allergen Reactions  . Penicillins Nausea Only    Outpatient Encounter Medications as of 07/30/2020  Medication Sig  . acetaminophen (TYLENOL) 325 MG tablet Take 650 mg by mouth in the morning, at noon, and at bedtime.  Marland Kitchen atenolol (TENORMIN) 25 MG tablet Take 25 mg by mouth daily.   . cholecalciferol (VITAMIN D) 1000 UNITS tablet Take 1,000 Units by mouth daily.   Marland Kitchen donepezil (ARICEPT) 5 MG tablet Take 1 tablet (5 mg total) by mouth 2 (two) times daily.  Marland Kitchen levothyroxine (SYNTHROID) 25 MCG tablet Take 25 mcg by mouth. Saturday and Sunday  . levothyroxine (SYNTHROID, LEVOTHROID) 50 MCG tablet Take by mouth daily. 50mg  Monday thru Friday. Saturday and Sunday 25mg  (half tablet)  . Lidocaine 4 % PTCH Apply topically daily. APPLY THERACARE 4% PATCH TO LOWER BACK DAILY, ON AT 9 AM, REMOVE 9 PM.  . losartan (COZAAR) 50 MG tablet Take 50 mg by mouth 2 (two) times daily. Morning and at 3:00pm  . memantine (NAMENDA) 10 MG tablet Take 1 tablet (10 mg total) by mouth 2 (two) times daily.  . Multiple Vitamins-Minerals (ICAPS MV) TABS Take 2 tablets by mouth daily. At 3:00pm  . omeprazole (PRILOSEC) 20 MG capsule Take 20 mg by mouth daily.  . sodium fluoride (PREVIDENT) 1.1 % GEL dental gel Place 1 application onto teeth at bedtime.  No facility-administered encounter medications on file as of 07/30/2020.    Review of Systems:  Review of Systems  Review of Systems  Constitutional: Negative for activity change, appetite change, chills, diaphoresis, fatigue and fever.  HENT: Negative for mouth sores, postnasal drip, rhinorrhea, sinus pain and sore throat.   Respiratory: Negative for apnea, cough, chest tightness, shortness of breath and wheezing.   Cardiovascular: Negative for chest pain, palpitations and leg swelling.  Gastrointestinal: Negative for abdominal distention, abdominal pain, constipation, diarrhea, nausea and vomiting.  Genitourinary: Negative for  dysuria and frequency.  Musculoskeletal: Negative for arthralgias, joint swelling and myalgias.  Skin: Negative for rash.  Neurological: Negative for dizziness, syncope, weakness, light-headedness and numbness.  Psychiatric/Behavioral: Negative for behavioral problems, confusion and sleep disturbance.     Health Maintenance  Topic Date Due  . TETANUS/TDAP  Never done  . DEXA SCAN  Never done  . PNA vac Low Risk Adult (2 of 2 - PCV13) 08/03/2014  . INFLUENZA VACCINE  Completed  . COVID-19 Vaccine  Completed    Physical Exam: Vitals:   07/30/20 1506  BP: (!) 146/60  Pulse: 70  Resp: 18  Temp: 97.6 F (36.4 C)  SpO2: 93%  Weight: 99 lb 4.8 oz (45 kg)  Height: 4\' 10"  (1.473 m)   Body mass index is 20.75 kg/m. Physical Exam  Constitutional: Alert. Very frail   HENT:  Head: Normocephalic.  Mouth/Throat: Oropharynx is clear and moist.  Eyes: Pupils are equal, round, and reactive to light.  Neck: Neck supple.  Cardiovascular: Normal rate and normal heart sounds.  No murmur heard. Pulmonary/Chest: Effort normal and breath sounds normal. No respiratory distress. No wheezes. She has no rales.  Abdominal: Soft. Bowel sounds are normal. No distension. There is no tenderness. There is no rebound.  Musculoskeletal: No edema.  Lymphadenopathy: none Neurological: Has Aphasia.  Skin: Skin is warm and dry.  Psychiatric: Normal mood and affect. Behavior is normal. Thought content normal.   Labs reviewed: Basic Metabolic Panel: Recent Labs    05/03/20 0000  NA 137  K 4.2  CL 103  CO2 26*  BUN 15  CREATININE 1.0  CALCIUM 9.0  TSH 0.46   Liver Function Tests: Recent Labs    05/03/20 0000  AST 13  ALT 11  ALKPHOS 70  ALBUMIN 3.9   No results for input(s): LIPASE, AMYLASE in the last 8760 hours. No results for input(s): AMMONIA in the last 8760 hours. CBC: Recent Labs    05/03/20 0000  WBC 7.5  NEUTROABS 4,860  HGB 11.8*  HCT 35*  PLT 219   Lipid Panel: No  results for input(s): CHOL, HDL, LDLCALC, TRIG, CHOLHDL, LDLDIRECT in the last 8760 hours. No results found for: HGBA1C  Procedures since last visit: No results found.  Assessment/Plan  Compression fracture of body of thoracic vertebra (HCC) Pain Controlled on Tylenol And Lidocaine Still waiting for records from her Old PCP to evaluate treatment for Osteoporosis  Hypothyroidism, unspecified type TSH normal in 09/21 Hypertension, unspecified type Controlled on Cozaar and Tenormin  Alzheimer's dementia without behavioral disturbance, unspecified timing of dementia onset ([place]) On Namenda and Aricept Supportive care  Vitamin D deficiency On Supplements  Gastroesophageal reflux disease without esophagitis Continue Prilosec   Labs/tests ordered:  * No order type specified * Next appt:  Visit date not found

## 2020-08-13 ENCOUNTER — Encounter: Payer: Self-pay | Admitting: Nurse Practitioner

## 2020-08-13 ENCOUNTER — Non-Acute Institutional Stay (SKILLED_NURSING_FACILITY): Payer: PPO | Admitting: Nurse Practitioner

## 2020-08-13 ENCOUNTER — Other Ambulatory Visit: Payer: Self-pay | Admitting: Internal Medicine

## 2020-08-13 DIAGNOSIS — F0393 Unspecified dementia, unspecified severity, with mood disturbance: Secondary | ICD-10-CM

## 2020-08-13 DIAGNOSIS — S22000A Wedge compression fracture of unspecified thoracic vertebra, initial encounter for closed fracture: Secondary | ICD-10-CM | POA: Insufficient documentation

## 2020-08-13 DIAGNOSIS — E039 Hypothyroidism, unspecified: Secondary | ICD-10-CM | POA: Diagnosis not present

## 2020-08-13 DIAGNOSIS — I10 Essential (primary) hypertension: Secondary | ICD-10-CM | POA: Diagnosis not present

## 2020-08-13 DIAGNOSIS — F32A Depression, unspecified: Secondary | ICD-10-CM | POA: Diagnosis not present

## 2020-08-13 DIAGNOSIS — K219 Gastro-esophageal reflux disease without esophagitis: Secondary | ICD-10-CM | POA: Diagnosis not present

## 2020-08-13 DIAGNOSIS — G309 Alzheimer's disease, unspecified: Secondary | ICD-10-CM

## 2020-08-13 DIAGNOSIS — F0280 Dementia in other diseases classified elsewhere without behavioral disturbance: Secondary | ICD-10-CM | POA: Diagnosis not present

## 2020-08-13 DIAGNOSIS — E559 Vitamin D deficiency, unspecified: Secondary | ICD-10-CM

## 2020-08-13 DIAGNOSIS — S22000D Wedge compression fracture of unspecified thoracic vertebra, subsequent encounter for fracture with routine healing: Secondary | ICD-10-CM

## 2020-08-13 DIAGNOSIS — M8580 Other specified disorders of bone density and structure, unspecified site: Secondary | ICD-10-CM
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
## 2020-08-13 NOTE — Assessment & Plan Note (Signed)
Hypothyroidism, takes Levothyroxine, TSH 0.46 05/03/20. Repeat TSH

## 2020-08-13 NOTE — Assessment & Plan Note (Signed)
Vit D deficiency, takes Vit D, due for DEXA

## 2020-08-13 NOTE — Assessment & Plan Note (Signed)
Blood pressure is controlled, continue Atenolol, Losartan 

## 2020-08-13 NOTE — Assessment & Plan Note (Signed)
Stable, continue Omeprazole.  
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
## 2020-08-13 NOTE — Assessment & Plan Note (Addendum)
Angry, agitation noted per staff for a week or two, noted 2  emotional outburst episodes today, ?related to dementia, will try Sertraline 72m qd for mood, may consider Depakote if needed. Update CBC/diff, CMP/eGFR, TSH

## 2020-08-13 NOTE — Assessment & Plan Note (Signed)
Compression fracture of body of thoracic vertera, pain is controlled on Tylenol, Lidocaine.  ?

## 2020-08-14 ENCOUNTER — Encounter: Payer: Self-pay | Admitting: Nurse Practitioner

## 2020-08-14 DIAGNOSIS — I10 Essential (primary) hypertension: Secondary | ICD-10-CM | POA: Diagnosis not present

## 2020-08-14 LAB — COMPREHENSIVE METABOLIC PANEL
Albumin: 4.1 (ref 3.5–5.0)
Calcium: 9.5 (ref 8.7–10.7)
GFR calc Af Amer: 68
GFR calc non Af Amer: 58
Globulin: 2.7

## 2020-08-14 LAB — HEPATIC FUNCTION PANEL
ALT: 12 (ref 7–35)
AST: 15 (ref 13–35)
Alkaline Phosphatase: 112 (ref 25–125)

## 2020-08-14 LAB — BASIC METABOLIC PANEL
BUN: 18 (ref 4–21)
CO2: 27 — AB (ref 13–22)
Chloride: 102 (ref 99–108)
Creatinine: 0.9 (ref 0.5–1.1)
Glucose: 98
Potassium: 4.3 (ref 3.4–5.3)
Sodium: 138 (ref 137–147)

## 2020-08-14 LAB — CBC AND DIFFERENTIAL
HCT: 37 (ref 36–46)
Hemoglobin: 12.5 (ref 12.0–16.0)
Platelets: 270 (ref 150–399)
WBC: 8.5

## 2020-08-14 LAB — CBC: RBC: 4.04 (ref 3.87–5.11)

## 2020-08-14 LAB — TSH: TSH: 0.63 (ref 0.41–5.90)

## 2020-08-29 ENCOUNTER — Encounter: Payer: Self-pay | Admitting: Nurse Practitioner

## 2020-08-29 ENCOUNTER — Non-Acute Institutional Stay (SKILLED_NURSING_FACILITY): Payer: PPO | Admitting: Nurse Practitioner

## 2020-08-29 DIAGNOSIS — F0280 Dementia in other diseases classified elsewhere without behavioral disturbance: Secondary | ICD-10-CM | POA: Diagnosis not present

## 2020-08-29 DIAGNOSIS — C44719 Basal cell carcinoma of skin of left lower limb, including hip: Secondary | ICD-10-CM | POA: Diagnosis not present

## 2020-08-29 DIAGNOSIS — E039 Hypothyroidism, unspecified: Secondary | ICD-10-CM | POA: Diagnosis not present

## 2020-08-29 DIAGNOSIS — K219 Gastro-esophageal reflux disease without esophagitis: Secondary | ICD-10-CM

## 2020-08-29 DIAGNOSIS — S22000D Wedge compression fracture of unspecified thoracic vertebra, subsequent encounter for fracture with routine healing: Secondary | ICD-10-CM | POA: Diagnosis not present

## 2020-08-29 DIAGNOSIS — F32A Depression, unspecified: Secondary | ICD-10-CM

## 2020-08-29 DIAGNOSIS — G309 Alzheimer's disease, unspecified: Secondary | ICD-10-CM | POA: Diagnosis not present

## 2020-08-29 DIAGNOSIS — I10 Essential (primary) hypertension: Secondary | ICD-10-CM | POA: Diagnosis not present

## 2020-08-29 DIAGNOSIS — F0393 Unspecified dementia, unspecified severity, with mood disturbance: Secondary | ICD-10-CM

## 2020-08-29 DIAGNOSIS — E559 Vitamin D deficiency, unspecified: Secondary | ICD-10-CM | POA: Diagnosis not present

## 2020-08-29 NOTE — Assessment & Plan Note (Signed)
Compression fracture of body of thoracic vertera, pain is controlled on Tylenol, Lidocaine.  ?

## 2020-08-29 NOTE — Progress Notes (Signed)
Location:   [facility] Room Number: 3 Place of Service:  SNF (31) Provider:  Dasia Guerrier Vilinda Boehringer, MD  Patient Care Team: Mahlon Gammon, MD as PCP - General (Internal Medicine)  Extended Emergency Contact Information Primary Emergency Contact: [facility] Address: [address]          [address]          [place], [place] [address] [place] of [place] Home Phone: [phone] Relation: Spouse Secondary Emergency Contact: Tamre, [place] Mobile Phone: [phone] Relation: Daughter Interpreter needed? No  Code Status:  DNR Goals of care: Advanced Directive information Advanced Directives 08/29/2020  Does Patient Have a Medical Advance Directive? Yes  Type of Estate agent of [place];Out of facility DNR (pink MOST or yellow form)  Does patient want to make changes to medical advance directive? No - Patient declined  Copy of Healthcare Power of Attorney in Chart? Yes - validated most recent copy scanned in chart (See row information)  Pre-existing out of facility DNR order (yellow form or pink MOST form) Pink MOST form placed in chart (order not valid for inpatient use)     Chief Complaint  Patient presents with  . Medical Management of Chronic Issues    Routine follow up visit.     HPI:  Pt is a 87 y.o. female seen today for medical management of chronic diseases.    Dementia, takes Donepezil, Memantine, last seen neurology 12/12/19  Depression, mood is stabilized, takes Sertraline.              HTN, takes Atenolol, Losartan, Bun/creat 18/0.88 08/14/20 Vit D deficiency, takes Vit D, due for DEXA Compression fracture of body of thoracic vertera, pain is controlled on Tylenol, Lidocaine.  Hypothyroidism, takes Levothyroxine, TSH 0.62 08/14/20 GERD,resumedOmeprazole.Hgb 12.5 08/14/20    Past Medical History:  Diagnosis Date  . Barrett's esophagus   .  Dementia (HCC)   . Diverticulosis   . Esophageal stricture   . GERD (gastroesophageal reflux disease)   . History of colonic polyps   . History of hysterectomy   . Hypertension   . Hypothyroidism   . Internal hemorrhoids   . Kidney stone    pt denies 03/17/14  . Memory difficulty 10/17/2015  . Primary progressive aphasia (HCC) 01/22/2018   Past Surgical History:  Procedure Laterality Date  . APPENDECTOMY     with hysterectomy  . BREAST EXCISIONAL BIOPSY     right x 2 1962 and 1982  . THYROIDECTOMY, PARTIAL    . TONSILLECTOMY      Allergies  Allergen Reactions  . Penicillins Nausea Only    Allergies as of 08/29/2020      Reactions   Penicillins Nausea Only      Medication List       Accurate as of August 29, 2020 11:59 PM. If you have any questions, ask your nurse or doctor.        acetaminophen 325 MG tablet Commonly known as: TYLENOL Take 650 mg by mouth in the morning, at noon, and at bedtime.   atenolol 25 MG tablet Commonly known as: TENORMIN Take 25 mg by mouth daily.   cholecalciferol 1000 units tablet Commonly known as: VITAMIN D Take 1,000 Units by mouth daily.   donepezil 5 MG tablet Commonly known as: ARICEPT Take 1 tablet (5 mg total) by mouth 2 (two) times daily.   ICaps MV Tabs Take 2 tablets by mouth daily. At 3:00pm  levothyroxine 25 MCG tablet Commonly known as: SYNTHROID Take 25 mcg by mouth. Saturday and Sunday   levothyroxine 50 MCG tablet Commonly known as: SYNTHROID Take by mouth daily. 50mg  Monday thru Friday. Saturday and Sunday 25mg  (half tablet)   losartan 50 MG tablet Commonly known as: COZAAR Take 50 mg by mouth 2 (two) times daily. Morning and at 3:00pm   memantine 10 MG tablet Commonly known as: Namenda Take 1 tablet (10 mg total) by mouth 2 (two) times daily.   omeprazole 20 MG capsule Commonly known as: PRILOSEC Take 20 mg by mouth daily.   sertraline 25 MG tablet Commonly known as: ZOLOFT Take 25 mg by  mouth daily.   sodium fluoride 1.1 % Gel dental gel Commonly known as: FLUORISHIELD Place 1 application onto teeth at bedtime.       Review of Systems  Constitutional: Negative for fatigue, fever and unexpected weight change.  HENT: Positive for hearing loss. Negative for congestion and voice change.   Eyes: Negative for visual disturbance.  Respiratory: Negative for cough and shortness of breath.   Cardiovascular: Negative for leg swelling.  Gastrointestinal: Negative for abdominal pain and constipation.  Genitourinary: Negative for difficulty urinating, dysuria and urgency.  Musculoskeletal: Positive for gait problem.  Skin: Negative for color change.       Left midfoot plantar aspect skin lesion(hx of BCC about a quarter sized open area.   Neurological: Positive for speech difficulty. Negative for weakness, light-headedness and headaches.       Memory lapses. Difficulty wards finding.   Psychiatric/Behavioral: Positive for confusion. Negative for behavioral problems and sleep disturbance. The patient is not nervous/anxious.     Immunization History  Administered Date(s) Administered  . Influenza-Unspecified 06/06/2020  . Moderna Sars-Covid-2 Vaccination 08/26/2019, 09/26/2019, 07/03/2020  . Pneumococcal Polysaccharide-23 08/03/2013  . Zoster Recombinat (Shingrix) 01/06/2018, 03/11/2018   Pertinent  Health Maintenance Due  Topic Date Due  . DEXA SCAN  Never done  . PNA vac Low Risk Adult (2 of 2 - PCV13) 08/03/2014  . INFLUENZA VACCINE  Completed   Fall Risk  01/22/2018 01/08/2017  Falls in the past year? No No   Functional Status Survey:    Vitals:   08/29/20 0928  BP: 122/60  Pulse: 68  Resp: 16  Temp: (!) 97.1 F (36.2 C)  SpO2: 95%  Weight: 98 lb 12.8 oz (44.8 kg)  Height: 4\' 10"  (1.473 m)   Body mass index is 20.65 kg/m. Physical Exam Vitals reviewed.  Constitutional:      Appearance: Normal appearance.  HENT:     Head: Normocephalic and atraumatic.      Mouth/Throat:     Mouth: Mucous membranes are moist.  Eyes:     Conjunctiva/sclera: Conjunctivae normal.     Pupils: Pupils are equal, round, and reactive to light.  Cardiovascular:     Rate and Rhythm: Normal rate and regular rhythm.     Heart sounds: No murmur heard.   Pulmonary:     Effort: Pulmonary effort is normal.     Breath sounds: No rales.  Abdominal:     General: Bowel sounds are normal.     Palpations: Abdomen is soft.     Tenderness: There is no abdominal tenderness.  Musculoskeletal:     Cervical back: Normal range of motion and neck supple.     Right lower leg: No edema.     Left lower leg: No edema.  Skin:    General: Skin is warm and dry.  Findings: Lesion present.     Comments: Left midfoot plantar aspect skin lesion(hx of BCC about a quarter sized open area.    Neurological:     General: No focal deficit present.     Mental Status: She is alert. Mental status is at baseline.     Comments: Oriented to person  Psychiatric:        Mood and Affect: Mood normal.        Behavior: Behavior normal.        Thought Content: Thought content normal.     Labs reviewed: Recent Labs    05/03/20 0000  NA 137  K 4.2  CL 103  CO2 26*  BUN 15  CREATININE 1.0  CALCIUM 9.0   Recent Labs    05/03/20 0000  AST 13  ALT 11  ALKPHOS 70  ALBUMIN 3.9   Recent Labs    05/03/20 0000  WBC 7.5  NEUTROABS 4,860  HGB 11.8*  HCT 35*  PLT 219   Lab Results  Component Value Date   TSH 0.46 05/03/2020   No results found for: HGBA1C No results found for: CHOL, HDL, LDLCALC, LDLDIRECT, TRIG, CHOLHDL  Significant Diagnostic Results in last 30 days:  No results found.  Assessment/Plan Basal cell carcinoma of foot, left Plantar midfoot open lesion about a quarter sized, pending Dermatology evaluation.   Alzheimer's dementia ([place]) Dementia, takes Donepezil, Memantine, last seen neurology 12/12/19   Depression due to dementia [facility]) Depression, mood is  stabilized, takes Sertraline.   HTN (hypertension) HTN, takes Atenolol, Losartan, Bun/creat 18/0.88 08/14/20   Vitamin D deficiency Vit D deficiency, takes Vit D, due for DEXA   Thoracic compression fracture (HCC) Compression fracture of body of thoracic vertera, pain is controlled on Tylenol, Lidocaine.    Hypothyroidism Hypothyroidism, takes Levothyroxine, TSH 0.62 08/14/20  GERD GERD,resumedOmeprazole.Hgb 12.5 08/14/20     Family/ staff Communication: plan of care reviewed with the patient and charge nurse.   Labs/tests ordered:  none  Time spend 35 minutes.

## 2020-08-29 NOTE — Assessment & Plan Note (Signed)
GERD, resumed Omeprazole. Hgb 12.5 08/14/20 °  ° °

## 2020-08-29 NOTE — Assessment & Plan Note (Signed)
Plantar midfoot open lesion about a quarter sized, pending Dermatology evaluation.

## 2020-08-29 NOTE — Assessment & Plan Note (Signed)
Depression, mood is stabilized, takes Sertraline.  

## 2020-08-29 NOTE — Assessment & Plan Note (Signed)
Vit D deficiency, takes Vit D, due for DEXA  

## 2020-08-29 NOTE — Assessment & Plan Note (Signed)
Dementia, takes Donepezil, Memantine, last seen neurology 12/12/19 

## 2020-08-29 NOTE — Assessment & Plan Note (Signed)
Hypothyroidism, takes Levothyroxine, TSH 0.62 08/14/20

## 2020-08-29 NOTE — Assessment & Plan Note (Signed)
HTN, takes Atenolol, Losartan, Bun/creat 18/0.88 08/14/20  

## 2020-08-30 ENCOUNTER — Encounter: Payer: Self-pay | Admitting: Nurse Practitioner

## 2020-09-04 DIAGNOSIS — L821 Other seborrheic keratosis: Secondary | ICD-10-CM | POA: Diagnosis not present

## 2020-09-04 DIAGNOSIS — C44712 Basal cell carcinoma of skin of right lower limb, including hip: Secondary | ICD-10-CM | POA: Diagnosis not present

## 2020-09-04 DIAGNOSIS — L814 Other melanin hyperpigmentation: Secondary | ICD-10-CM | POA: Diagnosis not present

## 2020-09-04 DIAGNOSIS — L570 Actinic keratosis: Secondary | ICD-10-CM | POA: Diagnosis not present

## 2020-09-04 DIAGNOSIS — Z85068 Personal history of other malignant neoplasm of small intestine: Secondary | ICD-10-CM | POA: Diagnosis not present

## 2020-09-26 ENCOUNTER — Encounter: Payer: Self-pay | Admitting: Nurse Practitioner

## 2020-09-26 ENCOUNTER — Non-Acute Institutional Stay (SKILLED_NURSING_FACILITY): Payer: PPO | Admitting: Nurse Practitioner

## 2020-09-26 DIAGNOSIS — K219 Gastro-esophageal reflux disease without esophagitis: Secondary | ICD-10-CM | POA: Diagnosis not present

## 2020-09-26 DIAGNOSIS — C44719 Basal cell carcinoma of skin of left lower limb, including hip: Secondary | ICD-10-CM

## 2020-09-26 DIAGNOSIS — S22000D Wedge compression fracture of unspecified thoracic vertebra, subsequent encounter for fracture with routine healing: Secondary | ICD-10-CM

## 2020-09-26 DIAGNOSIS — E039 Hypothyroidism, unspecified: Secondary | ICD-10-CM

## 2020-09-26 DIAGNOSIS — F32A Depression, unspecified: Secondary | ICD-10-CM | POA: Diagnosis not present

## 2020-09-26 DIAGNOSIS — E559 Vitamin D deficiency, unspecified: Secondary | ICD-10-CM | POA: Diagnosis not present

## 2020-09-26 DIAGNOSIS — I10 Essential (primary) hypertension: Secondary | ICD-10-CM | POA: Diagnosis not present

## 2020-09-26 DIAGNOSIS — F0393 Unspecified dementia, unspecified severity, with mood disturbance: Secondary | ICD-10-CM

## 2020-09-26 DIAGNOSIS — G309 Alzheimer's disease, unspecified: Secondary | ICD-10-CM

## 2020-09-26 DIAGNOSIS — F0280 Dementia in other diseases classified elsewhere without behavioral disturbance: Secondary | ICD-10-CM

## 2020-09-26 NOTE — Assessment & Plan Note (Signed)
GERD,resumedOmeprazole.Hgb 12.5 08/14/20

## 2020-09-26 NOTE — Progress Notes (Signed)
Location:   [place] Room Number: 3 Place of Service:  SNF (31) Provider: Marlana Latus NP    Patient Care Team: [name], MD as PCP - General (Internal Medicine)  Extended Emergency Contact Information Primary Emergency Contact: [facility] Address: [address]          [place], [place] [address] [place] of [place] Phone: [phone] Relation: Spouse Secondary Emergency Contact: Shaana, [place] Mobile Phone: [phone] Relation: Daughter Interpreter needed? No  Code Status:  DNR Goals of care: Advanced Directive information Advanced Directives 09/26/2020  Does Patient Have a Medical Advance Directive? Yes  Type of Paramedic of [place];Living will;Out of facility DNR (pink MOST or yellow form)  Does patient want to make changes to medical advance directive? No - Patient declined  Copy of [place] in Chart? Yes - validated most recent copy scanned in chart (See row information)  Pre-existing out of facility DNR order (yellow form or pink MOST form) -     Chief Complaint  Patient presents with  . Medical Management of Chronic Issues    Routine Visit.  . Immunizations    Discuss the need for Tetanus Vaccine, and PNA Vaccine.    HPI:  Pt is a 87 y.o. female seen today for medical management of chronic diseases.    Dementia, takes Donepezil, Memantine, last seen neurology 12/12/19             Depression, mood is stabilized, takes Sertraline.  HTN, takes Atenolol, Losartan, Bun/creat 18/0.88 08/14/20 Vit D deficiency, takes Vit D, due for DEXA Compression fracture of body of thoracic vertera, pain is controlled on Tylenol, Lidocaine. Hypothyroidism, takes Levothyroxine, TSH 0.62 08/14/20 GERD,resumedOmeprazole.Hgb 12.5 08/14/20  Left foot plantar skin basal cell carcinoma, saw Dermatology 09/04/20,  treated with 5FU, Calcipoteiene cream    Past Medical History:  Diagnosis Date  . Barrett's esophagus   . Dementia ([place])   . Diverticulosis   . Esophageal stricture   . GERD (gastroesophageal reflux disease)   . History of colonic polyps   . History of hysterectomy   . Hypertension   . Hypothyroidism   . Internal hemorrhoids   . Kidney stone    pt denies 03/17/14  . Memory difficulty 10/17/2015  . Primary progressive aphasia ([place]) 01/22/2018   Past Surgical History:  Procedure Laterality Date  . APPENDECTOMY     with hysterectomy  . BREAST EXCISIONAL BIOPSY     right x 2 1962 and 1982  . THYROIDECTOMY, PARTIAL    . TONSILLECTOMY      Allergies  Allergen Reactions  . Penicillins Nausea Only    Allergies as of 09/26/2020      Reactions   Penicillins Nausea Only      Medication List       Accurate as of September 26, 2020 11:59 PM. If you have any questions, ask your nurse or doctor.        acetaminophen 325 MG tablet Commonly known as: TYLENOL Take 650 mg by mouth in the morning, at noon, and at bedtime.   atenolol 25 MG tablet Commonly known as: TENORMIN Take 25 mg by mouth daily.   cholecalciferol 1000 units tablet Commonly known as: VITAMIN D Take 1,000 Units by mouth daily.   donepezil 5 MG tablet Commonly known as: ARICEPT Take 1 tablet (5 mg total) by mouth 2 (two) times daily.  ICaps MV Tabs Take 2 tablets by mouth daily. At 3:00pm   levothyroxine 25 MCG tablet Commonly known as: SYNTHROID Take 25 mcg by mouth. Saturday and Sunday   levothyroxine 50 MCG tablet Commonly known as: SYNTHROID Take by mouth daily. 50mg  Monday thru Friday. Saturday and Sunday 25mg  (half tablet)   losartan 50 MG tablet Commonly known as: COZAAR Take 50 mg by mouth 2 (two) times daily. Morning and at 3:00pm   memantine 10 MG tablet Commonly known as: Namenda Take 1 tablet (10 mg total) by mouth 2 (two) times daily.   omeprazole 20 MG capsule Commonly known  as: PRILOSEC Take 20 mg by mouth daily.   sertraline 25 MG tablet Commonly known as: ZOLOFT Take 25 mg by mouth daily.   sodium fluoride 1.1 % Gel dental gel Commonly known as: FLUORISHIELD Place 1 application onto teeth at bedtime.       Review of Systems  Constitutional: Negative for fatigue, fever and unexpected weight change.  HENT: Positive for hearing loss. Negative for congestion and voice change.   Eyes: Negative for visual disturbance.  Respiratory: Negative for cough and shortness of breath.   Cardiovascular: Negative for leg swelling.  Gastrointestinal: Negative for abdominal pain and constipation.  Genitourinary: Negative for difficulty urinating, dysuria and urgency.  Musculoskeletal: Positive for gait problem.  Skin: Negative for color change.       Left midfoot plantar aspect skin lesion(hx of BCC about a quarter sized open area.   Neurological: Positive for speech difficulty. Negative for weakness, light-headedness and headaches.       Memory lapses. Difficulty wards finding.   Psychiatric/Behavioral: Positive for confusion. Negative for behavioral problems and sleep disturbance. The patient is not nervous/anxious.     Immunization History  Administered Date(s) Administered  . Influenza-Unspecified 06/06/2020  . Moderna Sars-Covid-2 Vaccination 08/26/2019, 09/26/2019, 07/03/2020  . Pneumococcal Polysaccharide-23 08/03/2013  . Zoster Recombinat (Shingrix) 01/06/2018, 03/11/2018   Pertinent  Health Maintenance Due  Topic Date Due  . DEXA SCAN  Never done  . PNA vac Low Risk Adult (2 of 2 - PCV13) 08/03/2014  . INFLUENZA VACCINE  Completed   Fall Risk  01/22/2018 01/08/2017  Falls in the past year? No No   Functional Status Survey:    Vitals:   09/26/20 1547  BP: (!) 166/70  Pulse: (!) 59  Resp: 18  Temp: 98 F (36.7 C)  SpO2: 98%  Weight: 98 lb 3.2 oz (44.5 kg)  Height: 4\' 10"  (1.473 m)   Body mass index is 20.52 kg/m. Physical Exam Vitals  reviewed.  Constitutional:      Appearance: Normal appearance.  HENT:     Head: Normocephalic and atraumatic.     Mouth/Throat:     Mouth: Mucous membranes are moist.  Eyes:     Conjunctiva/sclera: Conjunctivae normal.     Pupils: Pupils are equal, round, and reactive to light.  Cardiovascular:     Rate and Rhythm: Normal rate and regular rhythm.     Heart sounds: No murmur heard.   Pulmonary:     Effort: Pulmonary effort is normal.     Breath sounds: No rales.  Abdominal:     General: Bowel sounds are normal.     Palpations: Abdomen is soft.     Tenderness: There is no abdominal tenderness.  Musculoskeletal:     Cervical back: Normal range of motion and neck supple.     Right lower leg: No edema.     Left lower  leg: No edema.  Skin:    General: Skin is warm and dry.     Findings: Lesion present.     Comments: Left midfoot plantar aspect skin lesion(hx of BCC about a quarter sized open area.    Neurological:     General: No focal deficit present.     Mental Status: She is alert. Mental status is at baseline.     Comments: Oriented to person  Psychiatric:        Mood and Affect: Mood normal.        Behavior: Behavior normal.        Thought Content: Thought content normal.     Labs reviewed: Recent Labs    05/03/20 0000 08/14/20 0000  NA 137 138  K 4.2 4.3  CL 103 102  CO2 26* 27*  BUN 15 18  CREATININE 1.0 0.9  CALCIUM 9.0 9.5   Recent Labs    05/03/20 0000 08/14/20 0000  AST 13 15  ALT 11 12  ALKPHOS 70 112  ALBUMIN 3.9 4.1   Recent Labs    05/03/20 0000 08/14/20 0000  WBC 7.5 8.5  NEUTROABS 4,860  --   HGB 11.8* 12.5  HCT 35* 37  PLT 219 270   Lab Results  Component Value Date   TSH 0.63 08/14/2020   No results found for: HGBA1C No results found for: CHOL, HDL, LDLCALC, LDLDIRECT, TRIG, CHOLHDL  Significant Diagnostic Results in last 30 days:  No results found.  Assessment/Plan  HTN (hypertension) HTN, takes Atenolol, Losartan,  Bun/creat 18/0.88 08/14/20   Depression due to dementia [facility])  Depression, mood is stabilized, takes Sertraline.   Alzheimer's dementia ([place]) Dementia, takes Donepezil, Memantine, last seen neurology 12/12/19   Vitamin D deficiency Vit D deficiency, takes Vit D, due for DEXA   Thoracic compression fracture (HCC) Compression fracture of body of thoracic vertera, pain is controlled on Tylenol, Lidocaine.  Hypothyroidism Hypothyroidism, takes Levothyroxine, TSH 0.62 08/14/20  GERD GERD,resumedOmeprazole.Hgb 12.5 08/14/20    Basal cell carcinoma of foot, left A quarter sized lesion, saw Dermatology 09/04/20, treated with 5FU, Calcipoteiene cream.    Family/ staff Communication: plan of care reviewed with the patient and charge nurse.   Labs/tests ordered: none  Time spend 35 minutes.

## 2020-09-26 NOTE — Assessment & Plan Note (Signed)
Depression, mood is stabilized, takes Sertraline.

## 2020-09-26 NOTE — Assessment & Plan Note (Signed)
Vit D deficiency, takes Vit D, due for DEXA  

## 2020-09-26 NOTE — Assessment & Plan Note (Signed)
HTN, takes Atenolol, Losartan, Bun/creat 18/0.88 08/14/20

## 2020-09-26 NOTE — Assessment & Plan Note (Signed)
Compression fracture of body of thoracic vertera, pain is controlled on Tylenol, Lidocaine.  ?

## 2020-09-26 NOTE — Assessment & Plan Note (Signed)
A quarter sized lesion, saw Dermatology 09/04/20, treated with 5FU, Calcipoteiene cream.

## 2020-09-26 NOTE — Assessment & Plan Note (Signed)
Hypothyroidism, takes Levothyroxine, TSH 0.62 08/14/20

## 2020-09-26 NOTE — Assessment & Plan Note (Signed)
Dementia, takes Donepezil, Memantine, last seen neurology 12/12/19 

## 2020-09-28 ENCOUNTER — Encounter: Payer: Self-pay | Admitting: Nurse Practitioner

## 2020-10-02 DIAGNOSIS — C44712 Basal cell carcinoma of skin of right lower limb, including hip: Secondary | ICD-10-CM | POA: Diagnosis not present

## 2020-10-02 DIAGNOSIS — L814 Other melanin hyperpigmentation: Secondary | ICD-10-CM | POA: Diagnosis not present

## 2020-10-02 DIAGNOSIS — Z85068 Personal history of other malignant neoplasm of small intestine: Secondary | ICD-10-CM | POA: Diagnosis not present

## 2020-10-02 DIAGNOSIS — L570 Actinic keratosis: Secondary | ICD-10-CM | POA: Diagnosis not present

## 2020-10-22 DIAGNOSIS — L814 Other melanin hyperpigmentation: Secondary | ICD-10-CM | POA: Diagnosis not present

## 2020-10-22 DIAGNOSIS — C44712 Basal cell carcinoma of skin of right lower limb, including hip: Secondary | ICD-10-CM | POA: Diagnosis not present

## 2020-10-22 DIAGNOSIS — Z85068 Personal history of other malignant neoplasm of small intestine: Secondary | ICD-10-CM | POA: Diagnosis not present

## 2020-10-23 ENCOUNTER — Non-Acute Institutional Stay (SKILLED_NURSING_FACILITY): Payer: PPO | Admitting: Internal Medicine

## 2020-10-23 ENCOUNTER — Encounter: Payer: Self-pay | Admitting: Internal Medicine

## 2020-10-23 DIAGNOSIS — G309 Alzheimer's disease, unspecified: Secondary | ICD-10-CM

## 2020-10-23 DIAGNOSIS — F0393 Unspecified dementia, unspecified severity, with mood disturbance: Secondary | ICD-10-CM

## 2020-10-23 DIAGNOSIS — E559 Vitamin D deficiency, unspecified: Secondary | ICD-10-CM | POA: Diagnosis not present

## 2020-10-23 DIAGNOSIS — I10 Essential (primary) hypertension: Secondary | ICD-10-CM | POA: Diagnosis not present

## 2020-10-23 DIAGNOSIS — K219 Gastro-esophageal reflux disease without esophagitis: Secondary | ICD-10-CM | POA: Diagnosis not present

## 2020-10-23 DIAGNOSIS — F0280 Dementia in other diseases classified elsewhere without behavioral disturbance: Secondary | ICD-10-CM

## 2020-10-23 DIAGNOSIS — F32A Depression, unspecified: Secondary | ICD-10-CM | POA: Diagnosis not present

## 2020-10-23 DIAGNOSIS — S22000D Wedge compression fracture of unspecified thoracic vertebra, subsequent encounter for fracture with routine healing: Secondary | ICD-10-CM | POA: Diagnosis not present

## 2020-10-23 DIAGNOSIS — E039 Hypothyroidism, unspecified: Secondary | ICD-10-CM | POA: Diagnosis not present

## 2020-10-23 NOTE — Progress Notes (Signed)
[facility] Service:  SNF (31)  Provider:   Code Status:  Goals of Care:  Advanced Directives 09/26/2020  Does Patient Have a Medical Advance Directive? Yes  Type of Paramedic of [place];Living will;Out of facility DNR (pink MOST or yellow form)  Does patient want to make changes to medical advance directive? No - Patient declined  Copy of [place] in Chart? Yes - validated most recent copy scanned in chart (See row information)  Pre-existing out of facility DNR order (yellow form or pink MOST form) -     Chief Complaint  Patient presents with  . Medical Management of Chronic Issues    HPI: Patient is a 87 y.o. female seen today for medical management of chronic diseases.    LTC resident  Has history of hypertension, hypothyroidism, and GERD, left plantar basal cell cancer, Alzheimer's dementia with aphasia  Recent T 5 Compression Fracture and L2 Compression  Pain Controlled on Tylenol and  Walks with No assist Weight stable No Nursing issues Has aphasia Husband main Caregiver Past Medical History:  Diagnosis Date  . Barrett's esophagus   . Dementia ([place])   . Diverticulosis   . Esophageal stricture   . GERD (gastroesophageal reflux disease)   . History of colonic polyps   . History of hysterectomy   . Hypertension   . Hypothyroidism   . Internal hemorrhoids   . Kidney stone    pt denies 03/17/14  . Memory difficulty 10/17/2015  . Primary progressive aphasia ([place]) 01/22/2018    Past Surgical History:  Procedure Laterality Date  . APPENDECTOMY     with hysterectomy  . BREAST EXCISIONAL BIOPSY     right x 2 1962 and 1982  . THYROIDECTOMY, PARTIAL    . TONSILLECTOMY      Allergies  Allergen Reactions  . Penicillins Nausea Only    Outpatient Encounter Medications as of 10/23/2020  Medication Sig  . acetaminophen (TYLENOL) 325 MG tablet Take 650 mg by mouth every 6 (six) hours  as needed.  Marland Kitchen atenolol (TENORMIN) 25 MG tablet Take 25 mg by mouth daily.   . cholecalciferol (VITAMIN D) 1000 UNITS tablet Take 1,000 Units by mouth daily.   Marland Kitchen donepezil (ARICEPT) 5 MG tablet Take 1 tablet (5 mg total) by mouth 2 (two) times daily.  Marland Kitchen levothyroxine (SYNTHROID) 25 MCG tablet Take 25 mcg by mouth. Saturday and Sunday  . levothyroxine (SYNTHROID, LEVOTHROID) 50 MCG tablet Take by mouth daily. 50mg  Monday thru Friday. Saturday and Sunday 25mg  (half tablet)  . losartan (COZAAR) 50 MG tablet Take 50 mg by mouth 2 (two) times daily. Morning and at 3:00pm  . memantine (NAMENDA) 10 MG tablet Take 1 tablet (10 mg total) by mouth 2 (two) times daily.  . Multiple Vitamins-Minerals (ICAPS MV) TABS Take 2 tablets by mouth daily. At 3:00pm  . omeprazole (PRILOSEC) 20 MG capsule Take 20 mg by mouth daily.  . sertraline (ZOLOFT) 25 MG tablet Take 25 mg by mouth daily.  . sodium fluoride (FLUORISHIELD) 1.1 % GEL dental gel Place 1 application onto teeth at bedtime.   No facility-administered encounter medications on file as of 10/23/2020.    Review of Systems:  Review of Systems  Unable to perform ROS: Dementia  And Aphasia  Health Maintenance  Topic Date Due  . TETANUS/TDAP  Never done  . DEXA SCAN  Never done  . PNA vac Low Risk Adult (2  of 2 - PCV13) 08/03/2014  . COVID-19 Vaccine (4 - Booster for Moderna series) 12/31/2020  . INFLUENZA VACCINE  Completed  . HPV VACCINES  Aged Out    Physical Exam: Vitals:   10/23/20 1351  BP: 120/60  Pulse: 70  Resp: 20  Temp: (!) 96.7 F (35.9 C)  Weight: 98 lb (44.5 kg)   Body mass index is 20.48 kg/m. Physical Exam  Constitutional: . Well-developed Very Frail  HENT:  Head: Normocephalic.  Mouth/Throat: Oropharynx is clear and moist.  Eyes: Pupils are equal, round, and reactive to light.  Neck: Neck supple.  Cardiovascular: Normal rate and normal heart sounds.  No murmur heard. Pulmonary/Chest: Effort normal and breath sounds  normal. No respiratory distress. No wheezes. She has no rales.  Abdominal: Soft. Bowel sounds are normal. No distension. There is no tenderness. There is no rebound.  Musculoskeletal: No edema.  Lymphadenopathy: none Neurological:Walks with No assist Skin: Skin is warm and dry.  Psychiatric: Normal mood and affect. Behavior is normal. Thought content normal.    Labs reviewed: Basic Metabolic Panel: Recent Labs    05/03/20 0000 08/14/20 0000  NA 137 138  K 4.2 4.3  CL 103 102  CO2 26* 27*  BUN 15 18  CREATININE 1.0 0.9  CALCIUM 9.0 9.5  TSH 0.46 0.63   Liver Function Tests: Recent Labs    05/03/20 0000 08/14/20 0000  AST 13 15  ALT 11 12  ALKPHOS 70 112  ALBUMIN 3.9 4.1   No results for input(s): LIPASE, AMYLASE in the last 8760 hours. No results for input(s): AMMONIA in the last 8760 hours. CBC: Recent Labs    05/03/20 0000 08/14/20 0000  WBC 7.5 8.5  NEUTROABS 4,860  --   HGB 11.8* 12.5  HCT 35* 37  PLT 219 270   Lipid Panel: No results for input(s): CHOL, HDL, LDLCALC, TRIG, CHOLHDL, LDLDIRECT in the last 8760 hours. No results found for: HGBA1C  Procedures since last visit: No results found.  Assessment/Plan Hypertension, unspecified type Stable on Tenormin and Cozaar  Depression due to dementia [facility]) Doing well on Zoloft Alzheimer's dementia without behavioral disturbance, unspecified timing of dementia onset ([place]) Continue Namenda and Aricept  Vitamin D deficiency Supplement Compression fracture of thoracic vertebra with routine healing, unspecified thoracic vertebral level, subsequent encounter Pain Controlled DEXA scan Pending  Hypothyroidism, unspecified type TSH normal in 12/21 Gastroesophageal reflux disease without esophagitis On Prilosec  Labs/tests ordered:  * No order type specified * Next appt:  Visit date not found

## 2020-11-13 ENCOUNTER — Non-Acute Institutional Stay (SKILLED_NURSING_FACILITY): Payer: PPO | Admitting: Internal Medicine

## 2020-11-13 ENCOUNTER — Encounter: Payer: Self-pay | Admitting: Internal Medicine

## 2020-11-13 DIAGNOSIS — S22000D Wedge compression fracture of unspecified thoracic vertebra, subsequent encounter for fracture with routine healing: Secondary | ICD-10-CM

## 2020-11-13 DIAGNOSIS — M810 Age-related osteoporosis without current pathological fracture: Secondary | ICD-10-CM

## 2020-11-13 NOTE — Progress Notes (Signed)
Location:   [facility] Room Number: 3 Place of Service:  SNF [phone]) Provider:  Veleta Miners MD  [name], MD  Patient Care Team: [name], MD as PCP - General (Internal Medicine)  Extended Emergency Contact Information Primary Emergency Contact: [facility] Address: [address]          [place], [place] [address] [place] Phone: [phone] Relation: Spouse Secondary Emergency Contact: Devita, Nies Mobile Phone: [phone] Relation: Daughter Interpreter needed? No  Code Status:  DNR Managed Care Goals of care: Advanced Directive information Advanced Directives 09/26/2020  Does Patient Have a Medical Advance Directive? Yes  Type of Paramedic of [place];Living will;Out of facility DNR (pink MOST or yellow form)  Does patient want to make changes to medical advance directive? No - Patient declined  Copy of [place] in Chart? Yes - validated most recent copy scanned in chart (See row information)  Pre-existing out of facility DNR order (yellow form or pink MOST form) -     Chief Complaint  Patient presents with  . Acute Visit    Family meeting    HPI:  Pt is a 87 y.o. female seen today for an acute visit to meet the daughter to discuss DEXA scan and Further treatment  Has history of hypertension, hypothyroidism, and GERD, left plantar basal cell cancer, Alzheimer's dementia with aphasia  Recent T 5 Compression Fracture and L2 Compression  I had ordered DEXA scan for her. Her daughter wanted to discuss different options and Treatment Patient does have dementia and aphasia with Depression and Anxiety. Family is worried that she would not be able tolerate Therapy She does walk with no assist.    Past Medical History:  Diagnosis Date  . Barrett's esophagus   . Dementia ([place])   . Diverticulosis   . Esophageal stricture   . GERD  (gastroesophageal reflux disease)   . History of colonic polyps   . History of hysterectomy   . Hypertension   . Hypothyroidism   . Internal hemorrhoids   . Kidney stone    pt denies 03/17/14  . Memory difficulty 10/17/2015  . Primary progressive aphasia ([place]) 01/22/2018   Past Surgical History:  Procedure Laterality Date  . APPENDECTOMY     with hysterectomy  . BREAST EXCISIONAL BIOPSY     right x 2 1962 and 1982  . THYROIDECTOMY, PARTIAL    . TONSILLECTOMY      Allergies  Allergen Reactions  . Penicillins Nausea Only    Allergies as of 11/13/2020      Reactions   Penicillins Nausea Only      Medication List       Accurate as of November 13, 2020  3:31 PM. If you have any questions, ask your nurse or doctor.        acetaminophen 325 MG tablet Commonly known as: TYLENOL Take 650 mg by mouth every 6 (six) hours as needed.   atenolol 25 MG tablet Commonly known as: TENORMIN Take 25 mg by mouth daily.   cholecalciferol 1000 units tablet Commonly known as: VITAMIN D Take 1,000 Units by mouth daily.   donepezil 5 MG tablet Commonly known as: ARICEPT Take 1 tablet (5 mg total) by mouth 2 (two) times daily.   ICaps MV Tabs Take 2 tablets by mouth daily. At 3:00pm   levothyroxine 25 MCG tablet  Commonly known as: SYNTHROID Take 25 mcg by mouth. Saturday and Sunday   levothyroxine 50 MCG tablet Commonly known as: SYNTHROID Take by mouth daily. 50mg  Monday thru Friday. Saturday and Sunday 25mg  (half tablet)   losartan 50 MG tablet Commonly known as: COZAAR Take 50 mg by mouth 2 (two) times daily. Morning and at 3:00pm   memantine 10 MG tablet Commonly known as: Namenda Take 1 tablet (10 mg total) by mouth 2 (two) times daily.   omeprazole 20 MG capsule Commonly known as: PRILOSEC Take 20 mg by mouth daily.   sertraline 25 MG tablet Commonly known as: ZOLOFT Take 25 mg by mouth daily.   sodium fluoride 1.1 % Gel dental gel Commonly known as:  FLUORISHIELD Place 1 application onto teeth at bedtime.       Review of Systems  Dementia and APhasia  Immunization History  Administered Date(s) Administered  . Influenza-Unspecified 06/06/2020  . Moderna Sars-Covid-2 Vaccination 08/26/2019, 09/26/2019, 07/03/2020  . Pneumococcal Polysaccharide-23 08/03/2013  . Zoster Recombinat (Shingrix) 01/06/2018, 03/11/2018   Pertinent  Health Maintenance Due  Topic Date Due  . DEXA SCAN  Never done  . PNA vac Low Risk Adult (2 of 2 - PCV13) 08/03/2014  . INFLUENZA VACCINE  Completed   Fall Risk  01/22/2018 01/08/2017  Falls in the past year? No No   Functional Status Survey:    Vitals:   11/13/20 1523  BP: 122/60  Pulse: 80  Resp: 18  Temp: 97.6 F (36.4 C)  SpO2: 95%  Weight: 95 lb 4.8 oz (43.2 kg)  Height: 4\' 10"  (1.473 m)   Body mass index is 19.92 kg/m. Physical Exam  Constitutional: . Well-developed and very Frail HENT:  Head: Normocephalic.  Mouth/Throat: Oropharynx is clear and moist.  Eyes: Pupils are equal, round, and reactive to light.  Neck: Neck supple.  Cardiovascular: Normal rate and normal heart sounds.  No murmur heard. Pulmonary/Chest: Effort normal and breath sounds normal. No respiratory distress. No wheezes. She has no rales.  Abdominal: Soft. Bowel sounds are normal. No distension. There is no tenderness. There is no rebound.  Musculoskeletal: No edema.  Lymphadenopathy: none Neurological: Has aphasia No Focal Deficits Skin: Skin is warm and dry.  Psychiatric: Normal mood and affect. Behavior is normal. Thought content normal.    Labs reviewed: Recent Labs    05/03/20 0000 08/14/20 0000  NA 137 138  K 4.2 4.3  CL 103 102  CO2 26* 27*  BUN 15 18  CREATININE 1.0 0.9  CALCIUM 9.0 9.5   Recent Labs    05/03/20 0000 08/14/20 0000  AST 13 15  ALT 11 12  ALKPHOS 70 112  ALBUMIN 3.9 4.1   Recent Labs    05/03/20 0000 08/14/20 0000  WBC 7.5 8.5  NEUTROABS 4,860  --   HGB 11.8*  12.5  HCT 35* 37  PLT 219 270   Lab Results  Component Value Date   TSH 0.63 08/14/2020   No results found for: HGBA1C No results found for: CHOL, HDL, LDLCALC, LDLDIRECT, TRIG, CHOLHDL  Significant Diagnostic Results in last 30 days:  No results found.  Assessment/Plan Compression fracture of thoracic vertebra with routine healing, and Possible osteoporosis  Her Pain is controlled Discussed with the family about her options of treatment with bisphosphonates or Prolia They don't think she will tolerate the therapy and do not want her to go through testing. Will discontinue the order   Other issues Hypertension, unspecified type Stable on Tenormin  and Cozaar  Depression due to dementia [facility]) Doing well on Zoloft Alzheimer's dementia without behavioral disturbance, unspecified timing of dementia onset ([place]) Continue Namenda and Aricept  Vitamin D deficiency Supplement Hypothyroidism, unspecified type TSH normal in 12/21 Gastroesophageal reflux disease without esophagitis On Prilosec  Family/ staff Communication:   Labs/tests ordered:

## 2020-11-21 ENCOUNTER — Other Ambulatory Visit: Payer: PPO

## 2020-11-30 ENCOUNTER — Encounter: Payer: Self-pay | Admitting: Nurse Practitioner

## 2020-11-30 ENCOUNTER — Non-Acute Institutional Stay (SKILLED_NURSING_FACILITY): Payer: PPO | Admitting: Nurse Practitioner

## 2020-11-30 DIAGNOSIS — E039 Hypothyroidism, unspecified: Secondary | ICD-10-CM | POA: Diagnosis not present

## 2020-11-30 DIAGNOSIS — C44719 Basal cell carcinoma of skin of left lower limb, including hip: Secondary | ICD-10-CM

## 2020-11-30 DIAGNOSIS — F0280 Dementia in other diseases classified elsewhere without behavioral disturbance: Secondary | ICD-10-CM | POA: Diagnosis not present

## 2020-11-30 DIAGNOSIS — I10 Essential (primary) hypertension: Secondary | ICD-10-CM | POA: Diagnosis not present

## 2020-11-30 DIAGNOSIS — G309 Alzheimer's disease, unspecified: Secondary | ICD-10-CM | POA: Diagnosis not present

## 2020-11-30 DIAGNOSIS — F32A Depression, unspecified: Secondary | ICD-10-CM

## 2020-11-30 DIAGNOSIS — S22000D Wedge compression fracture of unspecified thoracic vertebra, subsequent encounter for fracture with routine healing: Secondary | ICD-10-CM | POA: Diagnosis not present

## 2020-11-30 DIAGNOSIS — E559 Vitamin D deficiency, unspecified: Secondary | ICD-10-CM | POA: Diagnosis not present

## 2020-11-30 DIAGNOSIS — F0393 Unspecified dementia, unspecified severity, with mood disturbance: Secondary | ICD-10-CM

## 2020-11-30 DIAGNOSIS — K219 Gastro-esophageal reflux disease without esophagitis: Secondary | ICD-10-CM | POA: Diagnosis not present

## 2020-11-30 NOTE — Progress Notes (Signed)
Location:   [place] Room Number: 3 Place of Service:  SNF (31) Provider:  Mas, Natacia Chaisson X, NP  [name], MD  Patient Care Team: [name], MD as PCP - General (Internal Medicine)  Extended Emergency Contact Information Primary Emergency Contact: [facility] Address: [address]          [place],  [address] [place] Phone: [phone] Relation: Spouse Secondary Emergency Contact: Rolla, Servidio Mobile Phone: [phone] Relation: Daughter Interpreter needed? No  Code Status:  DNR Goals of care: Advanced Directive information Advanced Directives 11/30/2020  Does Patient Have a Medical Advance Directive? Yes  Type of Advance Directive Out of facility DNR (pink MOST or yellow form)  Does patient want to make changes to medical advance directive? No - Patient declined  Copy of [place] in Chart? -  Pre-existing out of facility DNR order (yellow form or pink MOST form) Pink MOST form placed in chart (order not valid for inpatient use)     Chief Complaint  Patient presents with  . Medical Management of Chronic Issues    Routine follow up visit. Discuss need for Tetanus/Tdap, Pneumonia vaccine and Dexa scan    HPI:  Pt is a 87 y.o. female seen today for medical management of chronic diseases.    Dementia, takes Donepezil, Memantine, last seen neurology 12/12/19 Depression, mood is stabilized, takes Sertraline. HTN, takes Atenolol, Losartan, Bun/creat 18/0.88 08/14/20 Vit D deficiency, takes Vit D, declined DEXA Compression fracture of body of thoracic vertera, pain is controlled on Tylenol, Lidocaine.declined DEXA Hypothyroidism, takes Levothyroxine, TSH 0.62 08/14/20 GERD,resumedOmeprazole.Hgb 12.5 08/14/20             Left foot plantar skin basal cell carcinoma, saw Dermatology 09/04/20,  treated with 5FU, Calcipoteiene cream    Past Medical History:  Diagnosis Date  . Barrett's esophagus   . Dementia ([place])   . Diverticulosis   . Esophageal stricture   . GERD (gastroesophageal reflux disease)   . History of colonic polyps   . History of hysterectomy   . Hypertension   . Hypothyroidism   . Internal hemorrhoids   . Kidney stone    pt denies 03/17/14  . Memory difficulty 10/17/2015  . Primary progressive aphasia ([place]) 01/22/2018   Past Surgical History:  Procedure Laterality Date  . APPENDECTOMY     with hysterectomy  . BREAST EXCISIONAL BIOPSY     right x 2 1962 and 1982  . THYROIDECTOMY, PARTIAL    . TONSILLECTOMY      Allergies  Allergen Reactions  . Penicillins Nausea Only    Allergies as of 11/30/2020      Reactions   Penicillins Nausea Only      Medication List       Accurate as of November 30, 2020 11:59 PM. If you have any questions, ask your nurse or doctor.        STOP taking these medications   ICaps MV Tabs Stopped by: Alixandrea Milleson X Berdina Cheever, NP     TAKE these medications   acetaminophen 325 MG tablet Commonly known as: TYLENOL Take 650 mg by mouth every 6 (six) hours as needed.   atenolol 25 MG tablet Commonly known as: TENORMIN Take 25 mg by mouth daily.   cholecalciferol 1000 units tablet Commonly known as: VITAMIN D Take 1,000 Units by mouth daily.   donepezil 5 MG  tablet Commonly known as: ARICEPT Take 1 tablet (5 mg total) by mouth 2 (two) times daily.   levothyroxine 25 MCG tablet Commonly known as: SYNTHROID Take 25 mcg by mouth. Saturday and Sunday   levothyroxine 50 MCG tablet Commonly known as: SYNTHROID Take by mouth daily. 50mg  Monday thru Friday. Saturday and Sunday 25mg  (half tablet)   losartan 50 MG tablet Commonly known as: COZAAR Take 50 mg by mouth 2 (two) times daily. Morning and at 3:00pm   memantine 10 MG tablet Commonly known as: Namenda Take 1 tablet (10 mg total) by mouth 2 (two) times daily.    omeprazole 20 MG capsule Commonly known as: PRILOSEC Take 20 mg by mouth daily.   sertraline 25 MG tablet Commonly known as: ZOLOFT Take 25 mg by mouth daily.   sodium fluoride 1.1 % Gel dental gel Commonly known as: FLUORISHIELD Place 1 application onto teeth at bedtime.       Review of Systems  Constitutional: Negative for fatigue, fever and unexpected weight change.       Gradual weight loss, but stable in the past 3 weeks.   HENT: Positive for hearing loss. Negative for congestion and voice change.   Eyes: Negative for visual disturbance.  Respiratory: Negative for shortness of breath.   Cardiovascular: Negative for leg swelling.  Gastrointestinal: Negative for abdominal pain and constipation.  Genitourinary: Negative for difficulty urinating, dysuria and urgency.  Musculoskeletal: Positive for gait problem.  Skin: Negative for color change.       Left midfoot plantar aspect skin lesion(hx of BCC about a quarter sized open area.   Neurological: Positive for speech difficulty. Negative for weakness and light-headedness.       Memory lapses. Difficulty wards finding.   Psychiatric/Behavioral: Positive for confusion. Negative for behavioral problems and sleep disturbance. The patient is not nervous/anxious.     Immunization History  Administered Date(s) Administered  . Influenza-Unspecified 06/06/2020  . Moderna Sars-Covid-2 Vaccination 08/26/2019, 09/26/2019, 07/03/2020  . Pneumococcal Polysaccharide-23 08/03/2013  . Zoster Recombinat (Shingrix) 01/06/2018, 03/11/2018   Pertinent  Health Maintenance Due  Topic Date Due  . DEXA SCAN  Never done  . PNA vac Low Risk Adult (2 of 2 - PCV13) 08/03/2014  . INFLUENZA VACCINE  03/25/2021   Fall Risk  01/22/2018 01/08/2017  Falls in the past year? No No   Functional Status Survey:    Vitals:   11/30/20 1016  BP: 120/64  Pulse: 71  Resp: 19  Temp: (!) 96.9 F (36.1 C)  SpO2: 97%  Weight: 96 lb 9.6 oz (43.8 kg)   Height: 4\' 10"  (1.473 m)   Body mass index is 20.19 kg/m. Physical Exam Vitals reviewed.  Constitutional:      Appearance: Normal appearance.  HENT:     Head: Normocephalic and atraumatic.     Mouth/Throat:     Mouth: Mucous membranes are moist.  Eyes:     Conjunctiva/sclera: Conjunctivae normal.     Pupils: Pupils are equal, round, and reactive to light.  Cardiovascular:     Rate and Rhythm: Normal rate and regular rhythm.     Heart sounds: No murmur heard.   Pulmonary:     Effort: Pulmonary effort is normal.     Breath sounds: No rales.  Abdominal:     General: Bowel sounds are normal.     Palpations: Abdomen is soft.     Tenderness: There is no abdominal tenderness.  Musculoskeletal:     Cervical back: Normal range of  motion and neck supple.     Right lower leg: No edema.     Left lower leg: No edema.  Skin:    General: Skin is warm and dry.     Findings: Lesion present.     Comments: Left midfoot plantar aspect skin lesion(hx of BCC)   Neurological:     General: No focal deficit present.     Mental Status: She is alert. Mental status is at baseline.     Comments: Oriented to person  Psychiatric:        Mood and Affect: Mood normal.        Behavior: Behavior normal.        Thought Content: Thought content normal.     Labs reviewed: Recent Labs    05/03/20 0000 08/14/20 0000  NA 137 138  K 4.2 4.3  CL 103 102  CO2 26* 27*  BUN 15 18  CREATININE 1.0 0.9  CALCIUM 9.0 9.5   Recent Labs    05/03/20 0000 08/14/20 0000  AST 13 15  ALT 11 12  ALKPHOS 70 112  ALBUMIN 3.9 4.1   Recent Labs    05/03/20 0000 08/14/20 0000  WBC 7.5 8.5  NEUTROABS 4,860  --   HGB 11.8* 12.5  HCT 35* 37  PLT 219 270   Lab Results  Component Value Date   TSH 0.63 08/14/2020   No results found for: HGBA1C No results found for: CHOL, HDL, LDLCALC, LDLDIRECT, TRIG, CHOLHDL  Significant Diagnostic Results in last 30 days:  No results  found.  Assessment/Plan Alzheimer's dementia ([place])  takes Donepezil, Memantine, last seen neurology 12/12/19   Depression due to dementia [facility]) mood is stabilized, takes Sertraline.   HTN (hypertension) Blood pressure is controlled, takes Atenolol, Losartan, Bun/creat 18/0.88 08/14/20   Vitamin D deficiency Vit D deficiency, takes Vit D, declined DEXA   Thoracic compression fracture ([facility]) Compression fracture of body of thoracic vertera, pain is controlled on Tylenol, Lidocaine.declined DEXA   Hypothyroidism  takes Levothyroxine, TSH 0.62 08/14/20   GERD resumedOmeprazole.Hgb 12.5 08/14/20   Basal cell carcinoma of foot, left Left foot plantar skin basal cell carcinoma, saw Dermatology 09/04/20, treated with 5FU, Calcipoteiene cream      Family/ staff Communication: plan of care reviewed with the patient and charge nurse.   Labs/tests ordered:  none  Time spend 35 minutes.

## 2020-12-03 ENCOUNTER — Encounter: Payer: Self-pay | Admitting: Nurse Practitioner

## 2020-12-03 NOTE — Assessment & Plan Note (Signed)
Compression fracture of body of thoracic vertera, pain is controlled on Tylenol, Lidocaine.declined DEXA

## 2020-12-03 NOTE — Assessment & Plan Note (Signed)
takes Levothyroxine, TSH 0.62 08/14/20

## 2020-12-03 NOTE — Assessment & Plan Note (Signed)
Vit D deficiency, takes Vit D, declined DEXA

## 2020-12-03 NOTE — Assessment & Plan Note (Signed)
takes Donepezil, Memantine, last seen neurology 12/12/19

## 2020-12-03 NOTE — Assessment & Plan Note (Signed)
mood is stabilized, takes Sertraline.

## 2020-12-03 NOTE — Assessment & Plan Note (Signed)
Left foot plantar skin basal cell carcinoma, saw Dermatology 09/04/20, treated with 5FU, Calcipoteiene cream

## 2020-12-03 NOTE — Assessment & Plan Note (Signed)
Blood pressure is controlled, takes Atenolol, Losartan, Bun/creat 18/0.88 08/14/20

## 2020-12-03 NOTE — Assessment & Plan Note (Signed)
resumedOmeprazole.Hgb 12.5 08/14/20

## 2020-12-06 DIAGNOSIS — C44712 Basal cell carcinoma of skin of right lower limb, including hip: Secondary | ICD-10-CM | POA: Diagnosis not present

## 2020-12-06 DIAGNOSIS — Z85068 Personal history of other malignant neoplasm of small intestine: Secondary | ICD-10-CM | POA: Diagnosis not present

## 2020-12-06 DIAGNOSIS — L814 Other melanin hyperpigmentation: Secondary | ICD-10-CM | POA: Diagnosis not present

## 2020-12-28 ENCOUNTER — Non-Acute Institutional Stay (SKILLED_NURSING_FACILITY): Payer: PPO | Admitting: Nurse Practitioner

## 2020-12-28 DIAGNOSIS — E559 Vitamin D deficiency, unspecified: Secondary | ICD-10-CM | POA: Diagnosis not present

## 2020-12-28 DIAGNOSIS — F32A Depression, unspecified: Secondary | ICD-10-CM | POA: Diagnosis not present

## 2020-12-28 DIAGNOSIS — I10 Essential (primary) hypertension: Secondary | ICD-10-CM | POA: Diagnosis not present

## 2020-12-28 DIAGNOSIS — K219 Gastro-esophageal reflux disease without esophagitis: Secondary | ICD-10-CM

## 2020-12-28 DIAGNOSIS — S22000D Wedge compression fracture of unspecified thoracic vertebra, subsequent encounter for fracture with routine healing: Secondary | ICD-10-CM

## 2020-12-28 DIAGNOSIS — C44719 Basal cell carcinoma of skin of left lower limb, including hip: Secondary | ICD-10-CM

## 2020-12-28 DIAGNOSIS — G309 Alzheimer's disease, unspecified: Secondary | ICD-10-CM

## 2020-12-28 DIAGNOSIS — F0280 Dementia in other diseases classified elsewhere without behavioral disturbance: Secondary | ICD-10-CM

## 2020-12-28 DIAGNOSIS — F0393 Unspecified dementia, unspecified severity, with mood disturbance: Secondary | ICD-10-CM

## 2020-12-28 DIAGNOSIS — E039 Hypothyroidism, unspecified: Secondary | ICD-10-CM | POA: Diagnosis not present

## 2020-12-28 NOTE — Assessment & Plan Note (Signed)
mood is stabilized, takes Sertraline.  

## 2020-12-28 NOTE — Assessment & Plan Note (Signed)
Blood pressure is controlled, takes Atenolol, Losartan, Bun/creat 18/0.88 08/14/20

## 2020-12-28 NOTE — Assessment & Plan Note (Signed)
Stable,  takes Levothyroxine, TSH 0.62 08/14/20

## 2020-12-28 NOTE — Assessment & Plan Note (Signed)
resumedOmeprazole.Hgb 12.5 08/14/20  

## 2020-12-28 NOTE — Assessment & Plan Note (Addendum)
No behavioral issues,  takes Donepezil, Memantine, last seen neurology 12/12/19

## 2020-12-28 NOTE — Assessment & Plan Note (Signed)
Left foot plantar skin basal cell carcinoma, saw Dermatology 09/04/20, treated with 5FU, Calcipoteiene cream  

## 2020-12-28 NOTE — Assessment & Plan Note (Signed)
Vit D deficiency, takes Vit D, declined DEXA

## 2020-12-28 NOTE — Assessment & Plan Note (Signed)
Compression fracture of body of thoracic vertera, pain is controlled on Tylenol, Lidocaine. declined DEXA 

## 2020-12-28 NOTE — Progress Notes (Signed)
Location:  SNF [place] Room Number: 3 Place of Service:  SNF (31) Provider: [facility] Kham Zuckerman NP  [name], MD  Patient Care Team: [name], MD as PCP - General (Internal Medicine)  Extended Emergency Contact Information Primary Emergency Contact: [facility] Address: [address]          [place], [place] [address] [place] Phone: [phone] Relation: Spouse Secondary Emergency Contact: Tihanna, [place] Mobile Phone: [phone] Relation: Daughter Interpreter needed? No  Code Status:  DNR Goals of care: Advanced Directive information Advanced Directives 11/30/2020  Does Patient Have a Medical Advance Directive? Yes  Type of Advance Directive Out of facility DNR (pink MOST or yellow form)  Does patient want to make changes to medical advance directive? No - Patient declined  Copy of [place] in Chart? -  Pre-existing out of facility DNR order (yellow form or pink MOST form) Pink MOST form placed in chart (order not valid for inpatient use)     Chief Complaint  Patient presents with  . Medical Management of Chronic Issues    HPI:  Pt is a 87 y.o. female seen today for medical management of chronic diseases.      Dementia, takes Donepezil, Memantine, last seen neurology 12/12/19 Depression, mood is stabilized, takes Sertraline. HTN, takes Atenolol, Losartan, Bun/creat 18/0.88 08/14/20 Vit D deficiency, takes Vit D, declined DEXA Compression fracture of body of thoracic vertera, pain is controlled on Tylenol, Lidocaine.declined DEXA Hypothyroidism, takes Levothyroxine, TSH 0.62 08/14/20 GERD,resumedOmeprazole.Hgb 12.5 08/14/20 Left foot plantar skin basal cell carcinoma,saw Dermatology 09/04/20, treated with 5FU, Calcipoteiene cream.  Past Medical History:  Diagnosis Date  . Barrett's esophagus   .  Dementia ([place])   . Diverticulosis   . Esophageal stricture   . GERD (gastroesophageal reflux disease)   . History of colonic polyps   . History of hysterectomy   . Hypertension   . Hypothyroidism   . Internal hemorrhoids   . Kidney stone    pt denies 03/17/14  . Memory difficulty 10/17/2015  . Primary progressive aphasia ([place]) 01/22/2018   Past Surgical History:  Procedure Laterality Date  . APPENDECTOMY     with hysterectomy  . BREAST EXCISIONAL BIOPSY     right x 2 1962 and 1982  . THYROIDECTOMY, PARTIAL    . TONSILLECTOMY      Allergies  Allergen Reactions  . Penicillins Nausea Only    Allergies as of 12/28/2020      Reactions   Penicillins Nausea Only      Medication List       Accurate as of Dec 28, 2020 11:59 PM. If you have any questions, ask your nurse or doctor.        acetaminophen 325 MG tablet Commonly known as: TYLENOL Take 650 mg by mouth every 6 (six) hours as needed.   atenolol 25 MG tablet Commonly known as: TENORMIN Take 25 mg by mouth daily.   cholecalciferol 1000 units tablet Commonly known as: VITAMIN D Take 1,000 Units by mouth daily.   donepezil 5 MG tablet Commonly known as: ARICEPT Take 1 tablet (5 mg total) by mouth 2 (two) times daily.   levothyroxine 25 MCG tablet Commonly known as: SYNTHROID Take 25 mcg by mouth. Saturday and Sunday   levothyroxine 50 MCG tablet Commonly known as: SYNTHROID Take by mouth daily. 50mg  Monday thru Friday. Saturday and Sunday 25mg  (half  tablet)   losartan 50 MG tablet Commonly known as: COZAAR Take 50 mg by mouth 2 (two) times daily. Morning and at 3:00pm   memantine 10 MG tablet Commonly known as: Namenda Take 1 tablet (10 mg total) by mouth 2 (two) times daily.   omeprazole 20 MG capsule Commonly known as: PRILOSEC Take 20 mg by mouth daily.   sertraline 25 MG tablet Commonly known as: ZOLOFT Take 25 mg by mouth daily.   sodium fluoride 1.1 % Gel dental gel Commonly known as:  FLUORISHIELD Place 1 application onto teeth at bedtime.       Review of Systems  Constitutional: Negative for fatigue, fever and unexpected weight change.  HENT: Positive for hearing loss. Negative for congestion and voice change.   Eyes: Negative for visual disturbance.  Respiratory: Negative for shortness of breath.   Cardiovascular: Negative for leg swelling.  Gastrointestinal: Negative for abdominal pain and constipation.  Genitourinary: Negative for difficulty urinating, dysuria and urgency.  Musculoskeletal: Positive for gait problem.  Skin: Negative for color change.       Left midfoot plantar aspect skin lesion(hx of BCC about a quarter sized open area.   Neurological: Positive for speech difficulty. Negative for weakness and light-headedness.       Memory lapses. Difficulty wards finding.   Psychiatric/Behavioral: Positive for confusion. Negative for behavioral problems and sleep disturbance. The patient is not nervous/anxious.     Immunization History  Administered Date(s) Administered  . Influenza-Unspecified 06/06/2020  . Moderna Sars-Covid-2 Vaccination 08/26/2019, 09/26/2019, 07/03/2020  . Pneumococcal Polysaccharide-23 08/03/2013  . Zoster Recombinat (Shingrix) 01/06/2018, 03/11/2018   Pertinent  Health Maintenance Due  Topic Date Due  . DEXA SCAN  Never done  . PNA vac Low Risk Adult (2 of 2 - PCV13) 08/03/2014  . INFLUENZA VACCINE  03/25/2021   Fall Risk  01/22/2018 01/08/2017  Falls in the past year? No No   Functional Status Survey:    Vitals:   12/28/20 1248  BP: 129/63  Pulse: 68  Resp: 20  Temp: (!) 97.4 F (36.3 C)  SpO2: 95%   There is no height or weight on file to calculate BMI. Physical Exam Vitals reviewed.  Constitutional:      Appearance: Normal appearance.  HENT:     Head: Normocephalic and atraumatic.     Mouth/Throat:     Mouth: Mucous membranes are moist.  Eyes:     Conjunctiva/sclera: Conjunctivae normal.     Pupils: Pupils  are equal, round, and reactive to light.  Cardiovascular:     Rate and Rhythm: Normal rate and regular rhythm.     Heart sounds: No murmur heard.   Pulmonary:     Effort: Pulmonary effort is normal.     Breath sounds: No rales.  Abdominal:     General: Bowel sounds are normal.     Palpations: Abdomen is soft.     Tenderness: There is no abdominal tenderness.  Musculoskeletal:     Cervical back: Normal range of motion and neck supple.     Right lower leg: No edema.     Left lower leg: No edema.  Skin:    General: Skin is warm and dry.     Findings: Lesion present.     Comments: Left midfoot plantar aspect skin lesion(hx of BCC)   Neurological:     General: No focal deficit present.     Mental Status: She is alert. Mental status is at baseline.     Comments:  Oriented to person  Psychiatric:        Mood and Affect: Mood normal.        Behavior: Behavior normal.        Thought Content: Thought content normal.     Labs reviewed: Recent Labs    05/03/20 0000 08/14/20 0000  NA 137 138  K 4.2 4.3  CL 103 102  CO2 26* 27*  BUN 15 18  CREATININE 1.0 0.9  CALCIUM 9.0 9.5   Recent Labs    05/03/20 0000 08/14/20 0000  AST 13 15  ALT 11 12  ALKPHOS 70 112  ALBUMIN 3.9 4.1   Recent Labs    05/03/20 0000 08/14/20 0000  WBC 7.5 8.5  NEUTROABS 4,860  --   HGB 11.8* 12.5  HCT 35* 37  PLT 219 270   Lab Results  Component Value Date   TSH 0.63 08/14/2020   No results found for: HGBA1C No results found for: CHOL, HDL, LDLCALC, LDLDIRECT, TRIG, CHOLHDL  Significant Diagnostic Results in last 30 days:  No results found.  Assessment/Plan  GERD resumedOmeprazole.Hgb 12.5 08/14/20   Hypothyroidism Stable,  takes Levothyroxine, TSH 0.62 08/14/20   Thoracic compression fracture (HCC) Compression fracture of body of thoracic vertera, pain is controlled on Tylenol, Lidocaine.declined DEXA   Basal cell carcinoma of foot, left Left foot plantar skin basal  cell carcinoma,saw Dermatology 09/04/20, treated with 5FU, Calcipoteiene cream.   Vitamin D deficiency Vit D deficiency, takes Vit D, declined DEXA  HTN (hypertension) Blood pressure is controlled, takes Atenolol, Losartan, Bun/creat 18/0.88 08/14/20  Depression due to dementia ([place]) mood is stabilized, takes Sertraline.   Alzheimer's dementia ([place]) No behavioral issues,  takes Donepezil, Memantine, last seen neurology 12/12/19    Family/ staff Communication: plan of care reviewed with the patient and charge nurse.   Labs/tests ordered:  none  Time spend 35 minutes.

## 2021-01-01 ENCOUNTER — Encounter: Payer: Self-pay | Admitting: Nurse Practitioner

## 2021-01-07 DIAGNOSIS — L814 Other melanin hyperpigmentation: Secondary | ICD-10-CM | POA: Diagnosis not present

## 2021-01-07 DIAGNOSIS — C44712 Basal cell carcinoma of skin of right lower limb, including hip: Secondary | ICD-10-CM | POA: Diagnosis not present

## 2021-01-07 DIAGNOSIS — Z85068 Personal history of other malignant neoplasm of small intestine: Secondary | ICD-10-CM | POA: Diagnosis not present

## 2021-02-05 ENCOUNTER — Non-Acute Institutional Stay (SKILLED_NURSING_FACILITY): Payer: PPO | Admitting: Internal Medicine

## 2021-02-05 ENCOUNTER — Encounter: Payer: Self-pay | Admitting: Internal Medicine

## 2021-02-05 DIAGNOSIS — E559 Vitamin D deficiency, unspecified: Secondary | ICD-10-CM | POA: Diagnosis not present

## 2021-02-05 DIAGNOSIS — K219 Gastro-esophageal reflux disease without esophagitis: Secondary | ICD-10-CM | POA: Diagnosis not present

## 2021-02-05 DIAGNOSIS — F32A Depression, unspecified: Secondary | ICD-10-CM | POA: Diagnosis not present

## 2021-02-05 DIAGNOSIS — I10 Essential (primary) hypertension: Secondary | ICD-10-CM | POA: Diagnosis not present

## 2021-02-05 DIAGNOSIS — S22000D Wedge compression fracture of unspecified thoracic vertebra, subsequent encounter for fracture with routine healing: Secondary | ICD-10-CM

## 2021-02-05 DIAGNOSIS — E039 Hypothyroidism, unspecified: Secondary | ICD-10-CM

## 2021-02-05 DIAGNOSIS — F0280 Dementia in other diseases classified elsewhere without behavioral disturbance: Secondary | ICD-10-CM

## 2021-02-05 DIAGNOSIS — G309 Alzheimer's disease, unspecified: Secondary | ICD-10-CM | POA: Diagnosis not present

## 2021-02-05 DIAGNOSIS — F0393 Unspecified dementia, unspecified severity, with mood disturbance: Secondary | ICD-10-CM

## 2021-02-05 NOTE — Progress Notes (Signed)
Location:   [place] Room Number: 3 Place of Service:  SNF [phone]) Provider:  Veleta Miners MD  [name], MD  Patient Care Team: [name], MD as PCP - General (Internal Medicine)  Extended Emergency Contact Information Primary Emergency Contact: [facility] Address: [address]          [place], [place] [address] [place] Phone: [phone] Relation: Spouse Secondary Emergency Contact: Brianah, [place] Mobile Phone: [phone] Relation: Daughter Interpreter needed? No  Code Status:  DNR Managed Care Goals of care: Advanced Directive information Advanced Directives 02/05/2021  Does Patient Have a Medical Advance Directive? Yes  Type of Advance Directive Out of facility DNR (pink MOST or yellow form)  Does patient want to make changes to medical advance directive? No - Patient declined  Copy of [place] in Chart? -  Pre-existing out of facility DNR order (yellow form or pink MOST form) Pink MOST form placed in chart (order not valid for inpatient use)     Chief Complaint  Patient presents with   Medical Management of Chronic Issues   Health Maintenance    Dexa scan, TDAP and PCV13    HPI:  Pt is a 87 y.o. female seen today for medical management of chronic diseases.    Has history of hypertension, hypothyroidism, and GERD, left plantar basal cell cancer, Alzheimer's dementia with aphasia  T 5 Compression Fracture and L2 Compression  No New Issue Walks with no recent falls Has Aphasia Weight stable Mood stable No New Nursing issues Husband comes and spends time with her Past Medical History:  Diagnosis Date   Barrett's esophagus    Dementia ([place])    Diverticulosis    Esophageal stricture    GERD (gastroesophageal reflux disease)    History of colonic polyps    History of hysterectomy    Hypertension    Hypothyroidism    Internal hemorrhoids    Kidney  stone    pt denies 03/17/14   Memory difficulty 10/17/2015   Primary progressive aphasia ([place]) 01/22/2018   Past Surgical History:  Procedure Laterality Date   APPENDECTOMY     with hysterectomy   BREAST EXCISIONAL BIOPSY     right x 2 1962 and 1982   THYROIDECTOMY, PARTIAL     TONSILLECTOMY      Allergies  Allergen Reactions   Penicillins Nausea Only    Allergies as of 02/05/2021       Reactions   Penicillins Nausea Only        Medication List        Accurate as of February 05, 2021  2:54 PM. If you have any questions, ask your nurse or doctor.          acetaminophen 325 MG tablet Commonly known as: TYLENOL Take 650 mg by mouth every 6 (six) hours as needed.   atenolol 25 MG tablet Commonly known as: TENORMIN Take 25 mg by mouth daily.   cholecalciferol 1000 units tablet Commonly known as: VITAMIN D Take 1,000 Units by mouth daily.   donepezil 5 MG tablet Commonly known as: ARICEPT Take 1 tablet (5 mg total) by mouth 2 (two) times daily.   levothyroxine 25 MCG tablet Commonly known as: SYNTHROID Take 25 mcg by mouth. Saturday and Sunday   levothyroxine 50 MCG tablet Commonly known as: SYNTHROID Take by mouth daily. 50mg  Monday thru Friday.  Saturday and Sunday 25mg  (half tablet)   losartan 50 MG tablet Commonly known as: COZAAR Take 50 mg by mouth 2 (two) times daily. Morning and at 3:00pm   memantine 10 MG tablet Commonly known as: Namenda Take 1 tablet (10 mg total) by mouth 2 (two) times daily.   omeprazole 20 MG capsule Commonly known as: PRILOSEC Take 20 mg by mouth daily.   PRESERVISION AREDS 2 PO Take 2 tablets by mouth daily.   sertraline 25 MG tablet Commonly known as: ZOLOFT Take 25 mg by mouth daily.   sodium fluoride 1.1 % Gel dental gel Commonly known as: FLUORISHIELD Place 1 application onto teeth at bedtime.        Review of Systems Dementia and Aphasia  Immunization History  Administered Date(s) Administered    Influenza-Unspecified 06/06/2020   Moderna SARS-COV2 Booster Vaccination 01/22/2021   Moderna Sars-Covid-2 Vaccination 08/26/2019, 09/26/2019, 07/03/2020   Pneumococcal Polysaccharide-23 08/03/2013   Zoster Recombinat (Shingrix) 01/06/2018, 03/11/2018   Pertinent  Health Maintenance Due  Topic Date Due   DEXA SCAN  Never done   PNA vac Low Risk Adult (2 of 2 - PCV13) 08/03/2014   INFLUENZA VACCINE  03/25/2021   Fall Risk  01/22/2018 01/08/2017  Falls in the past year? No No   Functional Status Survey:    Vitals:   02/05/21 1447  BP: (!) 140/54  Pulse: 61  Resp: 20  Temp: (!) 96.7 F (35.9 C)  SpO2: 97%  Weight: 96 lb 1.6 oz (43.6 kg)  Height: 4\' 10"  (1.473 m)   Body mass index is 20.08 kg/m. Physical Exam Constitutional: . Well-developed very frail HENT:  Head: Normocephalic.  Mouth/Throat: Oropharynx is clear and moist.  Eyes: Pupils are equal, round, and reactive to light.  Neck: Neck supple.  Cardiovascular: Normal rate and normal heart sounds.  No murmur heard. Pulmonary/Chest: Effort normal and breath sounds normal. No respiratory distress. No wheezes. She has no rales.  Abdominal: Soft. Bowel sounds are normal. No distension. There is no tenderness. There is no rebound.  Musculoskeletal: No edema.  Lymphadenopathy: none Neurological: Has Aphasia No Focal Deficits Skin: Skin is warm and dry.  Psychiatric: Normal mood and affect. Behavior is normal. Thought content normal.   Labs reviewed: Recent Labs    05/03/20 0000 08/14/20 0000  NA 137 138  K 4.2 4.3  CL 103 102  CO2 26* 27*  BUN 15 18  CREATININE 1.0 0.9  CALCIUM 9.0 9.5   Recent Labs    05/03/20 0000 08/14/20 0000  AST 13 15  ALT 11 12  ALKPHOS 70 112  ALBUMIN 3.9 4.1   Recent Labs    05/03/20 0000 08/14/20 0000  WBC 7.5 8.5  NEUTROABS 4,860  --   HGB 11.8* 12.5  HCT 35* 37  PLT 219 270   Lab Results  Component Value Date   TSH 0.63 08/14/2020   No results found for:  HGBA1C No results found for: CHOL, HDL, LDLCALC, LDLDIRECT, TRIG, CHOLHDL  Significant Diagnostic Results in last 30 days:  No results found.  Assessment/Plan  Hypothyroidism, unspecified type Normal in 12/21 Compression fracture of thoracic vertebra with routine healing,  Pain Controlled  Family refused further testing  Gastroesophageal reflux disease without esophagitis On Prilosec Vitamin D deficiency Supplement Hypertension, unspecified type On Tenormin and Cozaar Alzheimer's dementia without behavioral disturbance, unspecified timing of dementia onset ([place]) Namenda and Aricept Depression due to dementia [facility]) Doing well on Zoloft  Family/ staff Communication:   Labs/tests  ordered:

## 2021-02-21 DIAGNOSIS — R41841 Cognitive communication deficit: Secondary | ICD-10-CM | POA: Diagnosis not present

## 2021-02-21 DIAGNOSIS — R1310 Dysphagia, unspecified: Secondary | ICD-10-CM | POA: Diagnosis not present

## 2021-02-21 DIAGNOSIS — R1313 Dysphagia, pharyngeal phase: Secondary | ICD-10-CM | POA: Diagnosis not present

## 2021-02-27 ENCOUNTER — Non-Acute Institutional Stay (SKILLED_NURSING_FACILITY): Payer: PPO | Admitting: Nurse Practitioner

## 2021-02-27 ENCOUNTER — Encounter: Payer: Self-pay | Admitting: Nurse Practitioner

## 2021-02-27 DIAGNOSIS — C44719 Basal cell carcinoma of skin of left lower limb, including hip: Secondary | ICD-10-CM | POA: Diagnosis not present

## 2021-02-27 DIAGNOSIS — R634 Abnormal weight loss: Secondary | ICD-10-CM

## 2021-02-27 DIAGNOSIS — I10 Essential (primary) hypertension: Secondary | ICD-10-CM | POA: Diagnosis not present

## 2021-02-27 DIAGNOSIS — E039 Hypothyroidism, unspecified: Secondary | ICD-10-CM | POA: Diagnosis not present

## 2021-02-27 DIAGNOSIS — F32A Depression, unspecified: Secondary | ICD-10-CM | POA: Diagnosis not present

## 2021-02-27 DIAGNOSIS — G309 Alzheimer's disease, unspecified: Secondary | ICD-10-CM

## 2021-02-27 DIAGNOSIS — F0280 Dementia in other diseases classified elsewhere without behavioral disturbance: Secondary | ICD-10-CM | POA: Diagnosis not present

## 2021-02-27 DIAGNOSIS — K219 Gastro-esophageal reflux disease without esophagitis: Secondary | ICD-10-CM | POA: Diagnosis not present

## 2021-02-27 DIAGNOSIS — E559 Vitamin D deficiency, unspecified: Secondary | ICD-10-CM | POA: Diagnosis not present

## 2021-02-27 DIAGNOSIS — F0393 Unspecified dementia, unspecified severity, with mood disturbance: Secondary | ICD-10-CM

## 2021-02-27 DIAGNOSIS — S22000D Wedge compression fracture of unspecified thoracic vertebra, subsequent encounter for fracture with routine healing: Secondary | ICD-10-CM | POA: Diagnosis not present

## 2021-02-27 NOTE — Assessment & Plan Note (Signed)
Stable, takes Omeprazole. Hgb 12.5 08/14/20

## 2021-02-27 NOTE — Assessment & Plan Note (Signed)
Left foot plantar skin basal cell carcinoma, saw Dermatology 09/04/20, treated with 5FU, Calcipoteiene cream.

## 2021-02-27 NOTE — Assessment & Plan Note (Signed)
takes Atenolol, Losartan, Bun/creat 18/0.88 08/14/20

## 2021-02-27 NOTE — Assessment & Plan Note (Signed)
takes Donepezil, Memantine, seen neurology 

## 2021-02-27 NOTE — Progress Notes (Signed)
Location:   SNF [place] Room Number: V616 Place of Service:  SNF (31) Provider: [facility] Seema Blum NP  [name], MD  Patient Care Team: [name], MD as PCP - General (Internal Medicine)  Extended Emergency Contact Information Primary Emergency Contact: [facility] Address: [address]          [place], [place] [address] [place] Phone: [phone] Relation: Spouse Secondary Emergency Contact: Analiya, [place] Mobile Phone: [phone] Relation: Daughter Interpreter needed? No  Code Status:  DNR Goals of care: Advanced Directive information Advanced Directives 02/27/2021  Does Patient Have a Medical Advance Directive? Yes  Type of [place] of [place];Out of facility DNR (pink MOST or yellow form)  Does patient want to make changes to medical advance directive? No - Patient declined  Copy of [place] in Chart? Yes - validated most recent copy scanned in chart (See row information)  Pre-existing out of facility DNR order (yellow form or pink MOST form) Pink MOST form placed in chart (order not valid for inpatient use)     Chief Complaint  Patient presents with   Medical Management of Chronic Issues    Routine follow up   Health Maintenance    Discuss need for td/tdap and PNA vaccine.     HPI:  Pt is a 87 y.o. female seen today for medical management of chronic diseases.    Dementia, takes Donepezil, Memantine, seen neurology              Depression, mood is stabilized, takes Sertraline.              HTN, takes Atenolol, Losartan, Bun/creat 18/0.88 08/14/20             Vit D deficiency, takes Vit D, declined DEXA             Compression fracture of body of thoracic vertera, pain is controlled on Tylenol, Lidocaine. declined DEXA             Hypothyroidism, takes Levothyroxine, TSH 0.62 08/14/20             GERD, takes Omeprazole. Hgb 12.5 08/14/20             Left  foot plantar skin basal cell carcinoma, saw Dermatology 09/04/20, treated with 5FU, Calcipoteiene cream.   Past Medical History:  Diagnosis Date   Barrett's esophagus    Dementia ([place])    Diverticulosis    Esophageal stricture    GERD (gastroesophageal reflux disease)    History of colonic polyps    History of hysterectomy    Hypertension    Hypothyroidism    Internal hemorrhoids    Kidney stone    pt denies 03/17/14   Memory difficulty 10/17/2015   Primary progressive aphasia ([place]) 01/22/2018   Past Surgical History:  Procedure Laterality Date   APPENDECTOMY     with hysterectomy   BREAST EXCISIONAL BIOPSY     right x 2 1962 and 1982   THYROIDECTOMY, PARTIAL     TONSILLECTOMY      Allergies  Allergen Reactions   Penicillins Nausea Only    Allergies as of 02/27/2021       Reactions   Penicillins Nausea Only        Medication List        Accurate as of February 27, 2021 11:59 PM. If you have any  questions, ask your nurse or doctor.          acetaminophen 325 MG tablet Commonly known as: TYLENOL Take 650 mg by mouth every 6 (six) hours as needed.   atenolol 25 MG tablet Commonly known as: TENORMIN Take 25 mg by mouth daily.   cholecalciferol 1000 units tablet Commonly known as: VITAMIN D Take 1,000 Units by mouth daily.   donepezil 5 MG tablet Commonly known as: ARICEPT Take 1 tablet (5 mg total) by mouth 2 (two) times daily.   levothyroxine 25 MCG tablet Commonly known as: SYNTHROID Take 25 mcg by mouth. Saturday and Sunday   levothyroxine 50 MCG tablet Commonly known as: SYNTHROID Take by mouth daily. $RemoveBefo'50mg'WuENkSvGPnA$  Monday thru Friday. Saturday and Sunday $RemoveBe'25mg'LCaCReGty$  (half tablet)   losartan 50 MG tablet Commonly known as: COZAAR Take 50 mg by mouth 2 (two) times daily. Morning and at 3:00pm   memantine 10 MG tablet Commonly known as: Namenda Take 1 tablet (10 mg total) by mouth 2 (two) times daily.   omeprazole 20 MG capsule Commonly known as:  PRILOSEC Take 20 mg by mouth daily.   PRESERVISION AREDS 2 PO Take 2 tablets by mouth daily.   sertraline 25 MG tablet Commonly known as: ZOLOFT Take 25 mg by mouth daily.   sodium fluoride 1.1 % Gel dental gel Commonly known as: FLUORISHIELD Place 1 application onto teeth at bedtime.        Review of Systems  Constitutional:  Positive for unexpected weight change. Negative for activity change, appetite change and fever.  HENT:  Positive for hearing loss. Negative for congestion and voice change.   Eyes:  Negative for visual disturbance.  Respiratory:  Negative for shortness of breath.   Cardiovascular:  Negative for leg swelling.  Gastrointestinal:  Negative for abdominal pain and constipation.  Genitourinary:  Negative for difficulty urinating, dysuria and urgency.  Musculoskeletal:  Positive for gait problem.  Skin:  Negative for color change.       Left midfoot plantar aspect skin lesion(hx of BCC about a quarter sized open area.   Neurological:  Positive for speech difficulty. Negative for weakness and light-headedness.       Memory lapses. Difficulty wards finding.   Psychiatric/Behavioral:  Positive for confusion. Negative for behavioral problems and sleep disturbance. The patient is not nervous/anxious.    Immunization History  Administered Date(s) Administered   Influenza-Unspecified 06/06/2020   Moderna SARS-COV2 Booster Vaccination 01/22/2021   Moderna Sars-Covid-2 Vaccination 08/26/2019, 09/26/2019, 07/03/2020   Pneumococcal Polysaccharide-23 08/03/2013   Zoster Recombinat (Shingrix) 01/06/2018, 03/11/2018   Pertinent  Health Maintenance Due  Topic Date Due   DEXA SCAN  Never done   PNA vac Low Risk Adult (2 of 2 - PCV13) 08/03/2014   INFLUENZA VACCINE  03/25/2021   Fall Risk  01/22/2018 01/08/2017  Falls in the past year? No No   Functional Status Survey:    Vitals:   02/27/21 1358  BP: (!) 146/74  Pulse: 63  Resp: 16  Temp: (!) 97.5 F (36.4 C)   SpO2: 97%  Weight: 89 lb (40.4 kg)  Height: $Remove'4\' 10"'CisRubS$  (1.473 m)   Body mass index is 18.6 kg/m. Physical Exam Vitals reviewed.  Constitutional:      Appearance: Normal appearance.  HENT:     Head: Normocephalic and atraumatic.     Mouth/Throat:     Mouth: Mucous membranes are moist.  Eyes:     Conjunctiva/sclera: Conjunctivae normal.     Pupils: Pupils are  equal, round, and reactive to light.  Cardiovascular:     Rate and Rhythm: Normal rate and regular rhythm.     Heart sounds: No murmur heard. Pulmonary:     Effort: Pulmonary effort is normal.     Breath sounds: No rales.  Abdominal:     General: Bowel sounds are normal.     Palpations: Abdomen is soft.     Tenderness: There is no abdominal tenderness.  Musculoskeletal:     Cervical back: Normal range of motion and neck supple.     Right lower leg: No edema.     Left lower leg: No edema.  Skin:    General: Skin is warm and dry.     Findings: Lesion present.     Comments: Left midfoot plantar aspect skin lesion(hx of BCC)   Neurological:     General: No focal deficit present.     Mental Status: She is alert. Mental status is at baseline.     Comments: Oriented to person  Psychiatric:        Mood and Affect: Mood normal.        Behavior: Behavior normal.        Thought Content: Thought content normal.    Labs reviewed: Recent Labs    05/03/20 0000 08/14/20 0000  NA 137 138  K 4.2 4.3  CL 103 102  CO2 26* 27*  BUN 15 18  CREATININE 1.0 0.9  CALCIUM 9.0 9.5   Recent Labs    05/03/20 0000 08/14/20 0000  AST 13 15  ALT 11 12  ALKPHOS 70 112  ALBUMIN 3.9 4.1   Recent Labs    05/03/20 0000 08/14/20 0000  WBC 7.5 8.5  NEUTROABS 4,860  --   HGB 11.8* 12.5  HCT 35* 37  PLT 219 270   Lab Results  Component Value Date   TSH 0.63 08/14/2020   No results found for: HGBA1C No results found for: CHOL, HDL, LDLCALC, LDLDIRECT, TRIG, CHOLHDL  Significant Diagnostic Results in last 30 days:  No  results found.  Assessment/Plan  Weight loss About #7Ibs in the past month, will update CBC/diff, CMP/eGFR, TSH, dietary f/u, may consider decrease Donepezil or adding Mirtazapine if desires.   Alzheimer's dementia ([place])  takes Donepezil, Memantine, seen neurology   Depression due to dementia [facility]) mood is stabilized, takes Sertraline.   HTN (hypertension)  takes Atenolol, Losartan, Bun/creat 18/0.88 08/14/20  Vitamin D deficiency Vit D deficiency, takes Vit D, declined DEXA  Thoracic compression fracture (HCC) Compression fracture of body of thoracic vertera, pain is controlled on Tylenol, Lidocaine. declined DEXA  Hypothyroidism  takes Levothyroxine, TSH 0.62 08/14/20  GERD Stable, takes Omeprazole. Hgb 12.5 08/14/20  Basal cell carcinoma of foot, left Left foot plantar skin basal cell carcinoma, saw Dermatology 09/04/20, treated with 5FU, Calcipoteiene cream.   Family/ staff Communication: plan of care reviewed with the patient and charge nurse.   Labs/tests ordered:  CBC/diff, CMP/eGFR, TSH  Time spend 35 minutes.

## 2021-02-27 NOTE — Assessment & Plan Note (Signed)
takes Levothyroxine, TSH 0.62 08/14/20

## 2021-02-27 NOTE — Assessment & Plan Note (Signed)
mood is stabilized, takes Sertraline.

## 2021-02-27 NOTE — Progress Notes (Signed)
Location:   [place] Room Number: [phone] Place of Service:  SNF (31) Provider:  Man Otho Darner, NP    Patient Care Team: [name], MD as PCP - General (Internal Medicine)  Extended Emergency Contact Information Primary Emergency Contact: [facility] Address: [address]          [place], [place] [address] [place] Phone: [phone] Relation: Spouse Secondary Emergency Contact: [place], [place] Mobile Phone: [phone] Relation: Daughter Interpreter needed? No  Code Status:  DNR Goals of care: Advanced Directive information Advanced Directives 02/27/2021  Does Patient Have a Medical Advance Directive? Yes  Type of Paramedic of [place];Out of facility DNR (pink MOST or yellow form)  Does patient want to make changes to medical advance directive? No - Patient declined  Copy of [place] in Chart? Yes - validated most recent copy scanned in chart (See row information)  Pre-existing out of facility DNR order (yellow form or pink MOST form) Pink MOST form placed in chart (order not valid for inpatient use)     Chief Complaint  Patient presents with   Medical Management of Chronic Issues    Routine follow up   Health Maintenance    Discuss need for td/tdap and PNA vaccine.     HPI:  Pt is a 87 y.o. female seen today for medical management of chronic diseases.     Past Medical History:  Diagnosis Date   Barrett's esophagus    Dementia ([place])    Diverticulosis    Esophageal stricture    GERD (gastroesophageal reflux disease)    History of colonic polyps    History of hysterectomy    Hypertension    Hypothyroidism    Internal hemorrhoids    Kidney stone    pt denies 03/17/14   Memory difficulty 10/17/2015   Primary progressive aphasia ([place]) 01/22/2018   Past Surgical History:  Procedure Laterality Date   APPENDECTOMY     with hysterectomy    BREAST EXCISIONAL BIOPSY     right x 2 1962 and 1982   THYROIDECTOMY, PARTIAL     TONSILLECTOMY      Allergies  Allergen Reactions   Penicillins Nausea Only    Allergies as of 02/27/2021       Reactions   Penicillins Nausea Only        Medication List        Accurate as of February 27, 2021  2:02 PM. If you have any questions, ask your nurse or doctor.          acetaminophen 325 MG tablet Commonly known as: TYLENOL Take 650 mg by mouth every 6 (six) hours as needed.   atenolol 25 MG tablet Commonly known as: TENORMIN Take 25 mg by mouth daily.   cholecalciferol 1000 units tablet Commonly known as: VITAMIN D Take 1,000 Units by mouth daily.   donepezil 5 MG tablet Commonly known as: ARICEPT Take 1 tablet (5 mg total) by mouth 2 (two) times daily.   levothyroxine 25 MCG tablet Commonly known as: SYNTHROID Take 25 mcg by mouth. Saturday and Sunday   levothyroxine 50 MCG tablet Commonly known as: SYNTHROID Take by mouth daily. 50mg  Monday thru Friday. Saturday and Sunday 25mg  (half tablet)   losartan 50 MG tablet Commonly known as: COZAAR Take 50 mg by mouth 2 (two) times daily. Morning and at 3:00pm  memantine 10 MG tablet Commonly known as: Namenda Take 1 tablet (10 mg total) by mouth 2 (two) times daily.   omeprazole 20 MG capsule Commonly known as: PRILOSEC Take 20 mg by mouth daily.   PRESERVISION AREDS 2 PO Take 2 tablets by mouth daily.   sertraline 25 MG tablet Commonly known as: ZOLOFT Take 25 mg by mouth daily.   sodium fluoride 1.1 % Gel dental gel Commonly known as: FLUORISHIELD Place 1 application onto teeth at bedtime.        Review of Systems  Immunization History  Administered Date(s) Administered   Influenza-Unspecified 06/06/2020   Moderna SARS-COV2 Booster Vaccination 01/22/2021   Moderna Sars-Covid-2 Vaccination 08/26/2019, 09/26/2019, 07/03/2020   Pneumococcal Polysaccharide-23 08/03/2013   Zoster Recombinat  (Shingrix) 01/06/2018, 03/11/2018   Pertinent  Health Maintenance Due  Topic Date Due   DEXA SCAN  Never done   PNA vac Low Risk Adult (2 of 2 - PCV13) 08/03/2014   INFLUENZA VACCINE  03/25/2021   Fall Risk  01/22/2018 01/08/2017  Falls in the past year? No No   Functional Status Survey:    Vitals:   02/27/21 1358  BP: (!) 146/74  Pulse: 63  Resp: 16  Temp: (!) 97.5 F (36.4 C)  SpO2: 97%  Weight: 89 lb (40.4 kg)  Height: 4\' 10"  (1.473 m)   Body mass index is 18.6 kg/m. Physical Exam  Labs reviewed: Recent Labs    05/03/20 0000 08/14/20 0000  NA 137 138  K 4.2 4.3  CL 103 102  CO2 26* 27*  BUN 15 18  CREATININE 1.0 0.9  CALCIUM 9.0 9.5   Recent Labs    05/03/20 0000 08/14/20 0000  AST 13 15  ALT 11 12  ALKPHOS 70 112  ALBUMIN 3.9 4.1   Recent Labs    05/03/20 0000 08/14/20 0000  WBC 7.5 8.5  NEUTROABS 4,860  --   HGB 11.8* 12.5  HCT 35* 37  PLT 219 270   Lab Results  Component Value Date   TSH 0.63 08/14/2020   No results found for: HGBA1C No results found for: CHOL, HDL, LDLCALC, LDLDIRECT, TRIG, CHOLHDL  Significant Diagnostic Results in last 30 days:  No results found.  Assessment/Plan There are no diagnoses linked to this encounter.   Family/ staff Communication:   Labs/tests ordered:

## 2021-02-27 NOTE — Assessment & Plan Note (Signed)
Compression fracture of body of thoracic vertera, pain is controlled on Tylenol, Lidocaine. declined DEXA

## 2021-02-27 NOTE — Assessment & Plan Note (Addendum)
About #7Ibs in the past month, will update CBC/diff, CMP/eGFR, TSH, dietary f/u, may consider decrease Donepezil or adding Mirtazapine if desires.  02/28/21 TSH 0.75, wbc 7.0, Hgb 12.0, plt 210, neutrophils 61, Na 138, K 4.4, Bunn 20, creat 0.89, eGFR 57

## 2021-02-27 NOTE — Assessment & Plan Note (Signed)
Vit D deficiency, takes Vit D, declined DEXA

## 2021-02-28 ENCOUNTER — Encounter: Payer: Self-pay | Admitting: Nurse Practitioner

## 2021-02-28 DIAGNOSIS — I10 Essential (primary) hypertension: Secondary | ICD-10-CM | POA: Diagnosis not present

## 2021-02-28 DIAGNOSIS — E039 Hypothyroidism, unspecified: Secondary | ICD-10-CM | POA: Diagnosis not present

## 2021-02-28 LAB — COMPREHENSIVE METABOLIC PANEL
Albumin: 3.8 (ref 3.5–5.0)
Calcium: 8.6 — AB (ref 8.7–10.7)
GFR calc Af Amer: 67
GFR calc non Af Amer: 57
Globulin: 2.4

## 2021-02-28 LAB — HEPATIC FUNCTION PANEL
ALT: 10 (ref 7–35)
AST: 13 (ref 13–35)
Alkaline Phosphatase: 75 (ref 25–125)
Bilirubin, Total: 0.6

## 2021-02-28 LAB — BASIC METABOLIC PANEL
BUN: 20 (ref 4–21)
CO2: 25 — AB (ref 13–22)
Chloride: 104 (ref 99–108)
Creatinine: 0.9 (ref 0.5–1.1)
Glucose: 90
Potassium: 4.4 (ref 3.4–5.3)
Sodium: 138 (ref 137–147)

## 2021-02-28 LAB — CBC AND DIFFERENTIAL
HCT: 36 (ref 36–46)
Hemoglobin: 12 (ref 12.0–16.0)
Neutrophils Absolute: 4270
Platelets: 210 (ref 150–399)
WBC: 7

## 2021-02-28 LAB — TSH: TSH: 0.75 (ref 0.41–5.90)

## 2021-02-28 LAB — CBC: RBC: 3.96 (ref 3.87–5.11)

## 2021-03-07 DIAGNOSIS — F329 Major depressive disorder, single episode, unspecified: Secondary | ICD-10-CM | POA: Diagnosis not present

## 2021-03-07 DIAGNOSIS — G309 Alzheimer's disease, unspecified: Secondary | ICD-10-CM | POA: Diagnosis not present

## 2021-03-08 ENCOUNTER — Non-Acute Institutional Stay (SKILLED_NURSING_FACILITY): Payer: PPO | Admitting: Nurse Practitioner

## 2021-03-08 ENCOUNTER — Encounter: Payer: Self-pay | Admitting: Nurse Practitioner

## 2021-03-08 DIAGNOSIS — F0280 Dementia in other diseases classified elsewhere without behavioral disturbance: Secondary | ICD-10-CM

## 2021-03-08 DIAGNOSIS — I10 Essential (primary) hypertension: Secondary | ICD-10-CM | POA: Diagnosis not present

## 2021-03-08 DIAGNOSIS — S22000D Wedge compression fracture of unspecified thoracic vertebra, subsequent encounter for fracture with routine healing: Secondary | ICD-10-CM | POA: Diagnosis not present

## 2021-03-08 DIAGNOSIS — S60051A Contusion of right little finger without damage to nail, initial encounter: Secondary | ICD-10-CM | POA: Diagnosis not present

## 2021-03-08 DIAGNOSIS — G309 Alzheimer's disease, unspecified: Secondary | ICD-10-CM | POA: Diagnosis not present

## 2021-03-08 DIAGNOSIS — K219 Gastro-esophageal reflux disease without esophagitis: Secondary | ICD-10-CM

## 2021-03-08 DIAGNOSIS — F32A Depression, unspecified: Secondary | ICD-10-CM | POA: Diagnosis not present

## 2021-03-08 DIAGNOSIS — E559 Vitamin D deficiency, unspecified: Secondary | ICD-10-CM | POA: Diagnosis not present

## 2021-03-08 DIAGNOSIS — E039 Hypothyroidism, unspecified: Secondary | ICD-10-CM | POA: Diagnosis not present

## 2021-03-08 DIAGNOSIS — F0393 Unspecified dementia, unspecified severity, with mood disturbance: Secondary | ICD-10-CM

## 2021-03-08 DIAGNOSIS — C44719 Basal cell carcinoma of skin of left lower limb, including hip: Secondary | ICD-10-CM | POA: Diagnosis not present

## 2021-03-08 DIAGNOSIS — M79644 Pain in right finger(s): Secondary | ICD-10-CM | POA: Diagnosis not present

## 2021-03-08 DIAGNOSIS — R2231 Localized swelling, mass and lump, right upper limb: Secondary | ICD-10-CM | POA: Diagnosis not present

## 2021-03-08 NOTE — Assessment & Plan Note (Signed)
mood is stabilized, takes Sertraline.

## 2021-03-08 NOTE — Assessment & Plan Note (Signed)
Stable, takes Omeprazole. Hgb 12.5 08/14/20

## 2021-03-08 NOTE — Assessment & Plan Note (Signed)
the right 5th finger bruised, swelling, pain when touched or attempted making a fist. The patient has no recollection of how it occurred, will X-ray 3 views of the right 5th finger to r/o fx.

## 2021-03-08 NOTE — Assessment & Plan Note (Signed)
takes Donepezil, Memantine, seen neurology 

## 2021-03-08 NOTE — Assessment & Plan Note (Signed)
Compression fracture of body of thoracic vertera, pain is controlled on Tylenol, Lidocaine. declined DEXA

## 2021-03-08 NOTE — Assessment & Plan Note (Signed)
Blood pressure is controlled, takes Atenolol, Losartan, Bun/creat 18/0.88 08/14/20

## 2021-03-08 NOTE — Assessment & Plan Note (Signed)
Left foot plantar skin basal cell carcinoma, saw Dermatology 09/04/20, treated with 5FU, Calcipoteiene cream.

## 2021-03-08 NOTE — Progress Notes (Signed)
Location:   [place] Room Number: [phone] Place of Service:  SNF (31) Provider:  Michella Detjen Otho Darner, NP    Patient Care Team: [name], MD as PCP - General (Internal Medicine)  Extended Emergency Contact Information Primary Emergency Contact: [facility] Address: [address]          [place], [place] [address] [place] Phone: [phone] Relation: Spouse Secondary Emergency Contact: [place], [place] Mobile Phone: [phone] Relation: Daughter Interpreter needed? No  Code Status:  DNR Goals of care: Advanced Directive information Advanced Directives 03/08/2021  Does Patient Have a Medical Advance Directive? Yes  Type of Paramedic of [place];Out of facility DNR (pink MOST or yellow form)  Does patient want to make changes to medical advance directive? No - Patient declined  Copy of [place] in Chart? Yes - validated most recent copy scanned in chart (See row information)  Pre-existing out of facility DNR order (yellow form or pink MOST form) Yellow form placed in chart (order not valid for inpatient use)     Chief Complaint  Patient presents with   Acute Visit    Patient complains of bruise on her right 5th finger and pain with motion.     HPI:  Pt is a 87 y.o. female seen today for an acute visit for the right 5th finger bruised, swelling, pain when touched or attempted making a fist.    Dementia, takes Donepezil, Memantine, seen neurology             Depression, mood is stabilized, takes Sertraline.              HTN, takes Atenolol, Losartan, Bun/creat 18/0.88 08/14/20             Vit D deficiency, takes Vit D, declined DEXA             Compression fracture of body of thoracic vertera, pain is controlled on Tylenol, Lidocaine. declined DEXA             Hypothyroidism, takes Levothyroxine, TSH 0.62 08/14/20             GERD, takes Omeprazole. Hgb 12.5  08/14/20             Left foot plantar skin basal cell carcinoma, saw Dermatology 09/04/20, treated with 5FU, Calcipoteiene cream.   Past Medical History:  Diagnosis Date   Barrett's esophagus    Dementia ([place])    Diverticulosis    Esophageal stricture    GERD (gastroesophageal reflux disease)    History of colonic polyps    History of hysterectomy    Hypertension    Hypothyroidism    Internal hemorrhoids    Kidney stone    pt denies 03/17/14   Memory difficulty 10/17/2015   Primary progressive aphasia ([place]) 01/22/2018   Past Surgical History:  Procedure Laterality Date   APPENDECTOMY     with hysterectomy   BREAST EXCISIONAL BIOPSY     right x 2 1962 and 1982   THYROIDECTOMY, PARTIAL     TONSILLECTOMY      Allergies  Allergen Reactions   Penicillins Nausea Only    Allergies as of 03/08/2021       Reactions   Penicillins Nausea Only        Medication List        Accurate as of March 08, 2021  3:33 PM. If you have any questions, ask your nurse or doctor.          acetaminophen 325 MG tablet Commonly known as: TYLENOL Take 650 mg by mouth every 6 (six) hours as needed.   atenolol 25 MG tablet Commonly known as: TENORMIN Take 25 mg by mouth daily.   cholecalciferol 1000 units tablet Commonly known as: VITAMIN D Take 1,000 Units by mouth daily.   donepezil 5 MG tablet Commonly known as: ARICEPT Take 1 tablet (5 mg total) by mouth 2 (two) times daily.   levothyroxine 25 MCG tablet Commonly known as: SYNTHROID Take 25 mcg by mouth. Saturday and Sunday   levothyroxine 50 MCG tablet Commonly known as: SYNTHROID Take by mouth daily. 50mg  Monday thru Friday. Saturday and Sunday 25mg  (half tablet)   losartan 50 MG tablet Commonly known as: COZAAR Take 50 mg by mouth 2 (two) times daily. Morning and at 3:00pm   memantine 10 MG tablet Commonly known as: Namenda Take 1 tablet (10 mg total) by mouth 2 (two) times daily.   omeprazole 20 MG  capsule Commonly known as: PRILOSEC Take 20 mg by mouth daily.   PRESERVISION AREDS 2 PO Take 2 tablets by mouth daily.   sertraline 25 MG tablet Commonly known as: ZOLOFT Take 25 mg by mouth daily.   sodium fluoride 1.1 % Gel dental gel Commonly known as: FLUORISHIELD Place 1 application onto teeth at bedtime.        Review of Systems  Constitutional:  Negative for activity change, appetite change and fever.  HENT:  Positive for hearing loss. Negative for congestion and voice change.   Eyes:  Negative for visual disturbance.  Respiratory:  Negative for shortness of breath.   Cardiovascular:  Negative for leg swelling.  Gastrointestinal:  Negative for abdominal pain and constipation.  Genitourinary:  Negative for difficulty urinating, dysuria and urgency.  Musculoskeletal:  Positive for gait problem.       The right 5th finger bruised, swelling, pain.   Skin:  Negative for color change.       Left midfoot plantar aspect skin lesion(hx of BCC about a quarter sized open area.   Neurological:  Positive for speech difficulty. Negative for weakness and light-headedness.       Memory lapses. Difficulty wards finding.   Psychiatric/Behavioral:  Positive for confusion. Negative for behavioral problems and sleep disturbance. The patient is not nervous/anxious.    Immunization History  Administered Date(s) Administered   Influenza-Unspecified 06/06/2020   Moderna SARS-COV2 Booster Vaccination 01/22/2021   Moderna Sars-Covid-2 Vaccination 08/26/2019, 09/26/2019, 07/03/2020   Pneumococcal Polysaccharide-23 08/03/2013   Zoster Recombinat (Shingrix) 01/06/2018, 03/11/2018   Pertinent  Health Maintenance Due  Topic Date Due   DEXA SCAN  Never done   PNA vac Low Risk Adult (2 of 2 - PCV13) 08/03/2014   INFLUENZA VACCINE  03/25/2021   Fall Risk  01/22/2018 01/08/2017  Falls in the past year? No No   Functional Status Survey:    Vitals:   03/08/21 1447  BP: 108/64  Pulse: 60   Resp: 16  Temp: (!) 96.8 F (36 C)  SpO2: 96%  Weight: 96 lb 12.8 oz (43.9 kg)  Height: 4\' 10"  (1.473 m)   Body mass index is 20.23 kg/m. Physical Exam Vitals reviewed.  Constitutional:      Appearance: Normal appearance.  HENT:     Head: Normocephalic and atraumatic.     Mouth/Throat:     Mouth: Mucous membranes are moist.  Eyes:     Conjunctiva/sclera: Conjunctivae normal.     Pupils: Pupils are equal, round, and reactive to light.  Cardiovascular:     Rate and Rhythm: Normal rate and regular rhythm.     Heart sounds: No murmur heard. Pulmonary:     Effort: Pulmonary effort is normal.     Breath sounds: No rales.  Abdominal:     General: Bowel sounds are normal.     Palpations: Abdomen is soft.     Tenderness: There is no abdominal tenderness.  Musculoskeletal:        General: Swelling, tenderness and signs of injury present.     Cervical back: Normal range of motion and neck supple.     Right lower leg: No edema.     Left lower leg: No edema.     Comments: The right 5th finger bruised, swelling, pain.    Skin:    General: Skin is warm and dry.     Findings: Bruising and lesion present.     Comments: Left midfoot plantar aspect skin lesion(hx of BCC)   Neurological:     General: No focal deficit present.     Mental Status: She is alert. Mental status is at baseline.     Comments: Oriented to person  Psychiatric:        Mood and Affect: Mood normal.        Behavior: Behavior normal.    Labs reviewed: Recent Labs    05/03/20 0000 08/14/20 0000  NA 137 138  K 4.2 4.3  CL 103 102  CO2 26* 27*  BUN 15 18  CREATININE 1.0 0.9  CALCIUM 9.0 9.5   Recent Labs    05/03/20 0000 08/14/20 0000  AST 13 15  ALT 11 12  ALKPHOS 70 112  ALBUMIN 3.9 4.1   Recent Labs    05/03/20 0000 08/14/20 0000  WBC 7.5 8.5  NEUTROABS 4,860  --   HGB 11.8* 12.5  HCT 35* 37  PLT 219 270   Lab Results  Component Value Date   TSH 0.63 08/14/2020   No results  found for: HGBA1C No results found for: CHOL, HDL, LDLCALC, LDLDIRECT, TRIG, CHOLHDL  Significant Diagnostic Results in last 30 days:  No results found.  Assessment/Plan Contusion of right little finger the right 5th finger bruised, swelling, pain when touched or attempted making a fist. The patient has no recollection of how it occurred, will X-ray 3 views of the right 5th finger to r/o fx.   Alzheimer's dementia ([place])  takes Donepezil, Memantine, seen neurology  Depression due to dementia [facility]) mood is stabilized, takes Sertraline.   HTN (hypertension) Blood pressure is controlled, takes Atenolol, Losartan, Bun/creat 18/0.88 08/14/20  Vitamin D deficiency Vit D deficiency, takes Vit D, declined DEXA  Thoracic compression fracture ([facility]) Compression fracture of body of thoracic vertera, pain is controlled on Tylenol, Lidocaine. declined DEXA  Hypothyroidism Hypothyroidism, takes Levothyroxine, TSH 0.62 08/14/20  GERD Stable, takes Omeprazole. Hgb 12.5 08/14/20  Basal cell carcinoma of foot, left Left foot plantar skin basal cell carcinoma, saw Dermatology 09/04/20, treated with 5FU, Calcipoteiene cream.    Family/ staff Communication: plan of care reviewed with the patient and charge nurse.   Labs/tests ordered:  X-ray 3 views the right 5th finger  Time spend 35 minutes.

## 2021-03-08 NOTE — Assessment & Plan Note (Signed)
Vit D deficiency, takes Vit D, declined DEXA

## 2021-03-08 NOTE — Assessment & Plan Note (Signed)
Hypothyroidism, takes Levothyroxine, TSH 0.62 08/14/20

## 2021-03-27 ENCOUNTER — Non-Acute Institutional Stay (SKILLED_NURSING_FACILITY): Payer: PPO | Admitting: Nurse Practitioner

## 2021-03-27 ENCOUNTER — Encounter: Payer: Self-pay | Admitting: Nurse Practitioner

## 2021-03-27 DIAGNOSIS — S22000D Wedge compression fracture of unspecified thoracic vertebra, subsequent encounter for fracture with routine healing: Secondary | ICD-10-CM | POA: Diagnosis not present

## 2021-03-27 DIAGNOSIS — C44719 Basal cell carcinoma of skin of left lower limb, including hip: Secondary | ICD-10-CM | POA: Diagnosis not present

## 2021-03-27 DIAGNOSIS — I10 Essential (primary) hypertension: Secondary | ICD-10-CM | POA: Diagnosis not present

## 2021-03-27 DIAGNOSIS — E039 Hypothyroidism, unspecified: Secondary | ICD-10-CM

## 2021-03-27 DIAGNOSIS — G309 Alzheimer's disease, unspecified: Secondary | ICD-10-CM

## 2021-03-27 DIAGNOSIS — F0393 Unspecified dementia, unspecified severity, with mood disturbance: Secondary | ICD-10-CM

## 2021-03-27 DIAGNOSIS — K219 Gastro-esophageal reflux disease without esophagitis: Secondary | ICD-10-CM

## 2021-03-27 DIAGNOSIS — E559 Vitamin D deficiency, unspecified: Secondary | ICD-10-CM | POA: Diagnosis not present

## 2021-03-27 DIAGNOSIS — F32A Depression, unspecified: Secondary | ICD-10-CM | POA: Diagnosis not present

## 2021-03-27 DIAGNOSIS — F0280 Dementia in other diseases classified elsewhere without behavioral disturbance: Secondary | ICD-10-CM | POA: Diagnosis not present

## 2021-03-27 NOTE — Assessment & Plan Note (Signed)
takes Levothyroxine, TSH 0.75 02/28/21

## 2021-03-27 NOTE — Progress Notes (Signed)
Location:   [place] Room Number: [id number] Place of Service:  SNF (31) Provider:  Katriana Dortch Otho Darner, NP  [name], MD  Patient Care Team: [name], MD as PCP - General (Internal Medicine)  Extended Emergency Contact Information Primary Emergency Contact: [facility] Address: [address]          [place], [place] [address] [place] Phone: [phone] Relation: Spouse Secondary Emergency Contact: [place], [place] Mobile Phone: [phone] Relation: Daughter Interpreter needed? No  Code Status:  DNR Goals of care: Advanced Directive information Advanced Directives 03/27/2021  Does Patient Have a Medical Advance Directive? Yes  Type of Paramedic of [place];Out of facility DNR (pink MOST or yellow form)  Does patient want to make changes to medical advance directive? No - Patient declined  Copy of [place] in Chart? Yes - validated most recent copy scanned in chart (See row information)  Pre-existing out of facility DNR order (yellow form or pink MOST form) Pink MOST form placed in chart (order not valid for inpatient use)     Chief Complaint  Patient presents with   Medical Management of Chronic Issues    Routine follow up.   Health Maintenance    Discuss need for td/tdap vaccine and PNA vaccine.    HPI:  Pt is a 87 y.o. female seen today for medical management of chronic diseases.    Dementia, takes Donepezil, Memantine, seen neurology             Depression, mood is stabilized, takes Sertraline.              HTN, takes Atenolol, Losartan, Bun/creat 20/0.9 02/28/21             Vit D deficiency, takes Vit D, declined DEXA             Compression fracture of body of thoracic vertera, pain is controlled on Tylenol, Lidocaine. declined DEXA             Hypothyroidism, takes Levothyroxine, TSH 0.75 02/28/21             GERD, takes Omeprazole. Hgb 12.0  02/28/21             Left foot plantar skin basal cell carcinoma, saw Dermatology 09/04/20, treated with 5FU, Calcipoteiene cream.   Past Medical History:  Diagnosis Date   Barrett's esophagus    Dementia ([place])    Diverticulosis    Esophageal stricture    GERD (gastroesophageal reflux disease)    History of colonic polyps    History of hysterectomy    Hypertension    Hypothyroidism    Internal hemorrhoids    Kidney stone    pt denies 03/17/14   Memory difficulty 10/17/2015   Primary progressive aphasia ([place]) 01/22/2018   Past Surgical History:  Procedure Laterality Date   APPENDECTOMY     with hysterectomy   BREAST EXCISIONAL BIOPSY     right x 2 1962 and 1982   THYROIDECTOMY, PARTIAL     TONSILLECTOMY      Allergies  Allergen Reactions   Penicillins Nausea Only    Allergies as of 03/27/2021       Reactions   Penicillins Nausea Only        Medication List        Accurate as of March 27, 2021  2:16 PM.  If you have any questions, ask your nurse or doctor.          acetaminophen 325 MG tablet Commonly known as: TYLENOL Take 650 mg by mouth every 6 (six) hours as needed.   atenolol 25 MG tablet Commonly known as: TENORMIN Take 25 mg by mouth daily.   cholecalciferol 1000 units tablet Commonly known as: VITAMIN D Take 1,000 Units by mouth daily.   donepezil 5 MG tablet Commonly known as: ARICEPT Take 1 tablet (5 mg total) by mouth 2 (two) times daily.   levothyroxine 25 MCG tablet Commonly known as: SYNTHROID Take 25 mcg by mouth. Saturday and Sunday   levothyroxine 50 MCG tablet Commonly known as: SYNTHROID Take by mouth daily. '50mg'$  Monday thru Friday. Saturday and Sunday '25mg'$  (half tablet)   losartan 50 MG tablet Commonly known as: COZAAR Take 50 mg by mouth 2 (two) times daily. Morning and at 3:00pm   memantine 10 MG tablet Commonly known as: Namenda Take 1 tablet (10 mg total) by mouth 2 (two) times daily.   omeprazole 20 MG  capsule Commonly known as: PRILOSEC Take 20 mg by mouth daily.   PRESERVISION AREDS 2 PO Take 2 tablets by mouth daily.   sertraline 25 MG tablet Commonly known as: ZOLOFT Take 25 mg by mouth daily.   sodium fluoride 1.1 % Gel dental gel Commonly known as: FLUORISHIELD Place 1 application onto teeth at bedtime.        Review of Systems  Constitutional:  Negative for fatigue, fever and unexpected weight change.  HENT:  Positive for hearing loss. Negative for congestion and voice change.   Eyes:  Negative for visual disturbance.  Respiratory:  Negative for shortness of breath.   Cardiovascular:  Negative for leg swelling.  Gastrointestinal:  Negative for abdominal pain and constipation.  Genitourinary:  Negative for difficulty urinating, dysuria and urgency.  Musculoskeletal:  Positive for gait problem.       The right 5th finger bruised, swelling, pain.   Skin:  Negative for color change.       Left midfoot plantar aspect skin lesion(hx of BCC about a quarter sized open area.   Neurological:  Positive for speech difficulty. Negative for weakness and light-headedness.       Memory lapses. Difficulty wards finding.   Psychiatric/Behavioral:  Positive for confusion. Negative for behavioral problems and sleep disturbance. The patient is not nervous/anxious.    Immunization History  Administered Date(s) Administered   Influenza-Unspecified 06/06/2020   Moderna SARS-COV2 Booster Vaccination 01/22/2021   Moderna Sars-Covid-2 Vaccination 08/26/2019, 09/26/2019, 07/03/2020   Pneumococcal Polysaccharide-23 08/03/2013   Zoster Recombinat (Shingrix) 01/06/2018, 03/11/2018   Pertinent  Health Maintenance Due  Topic Date Due   DEXA SCAN  Never done   PNA vac Low Risk Adult (2 of 2 - PCV13) 08/03/2014   INFLUENZA VACCINE  03/25/2021   Fall Risk  01/22/2018 01/08/2017  Falls in the past year? No No   Functional Status Survey:    Vitals:   03/27/21 0839  BP: (!) 155/55  Pulse:  68  Resp: 20  Temp: (!) 97.4 F (36.3 C)  SpO2: 94%  Weight: 96 lb 9.6 oz (43.8 kg)  Height: '4\' 10"'$  (1.473 m)   Body mass index is 20.19 kg/m. Physical Exam Vitals reviewed.  Constitutional:      Appearance: Normal appearance.  HENT:     Head: Normocephalic and atraumatic.     Mouth/Throat:     Mouth: Mucous membranes are moist.  Eyes:     Conjunctiva/sclera: Conjunctivae normal.     Pupils: Pupils are equal, round, and reactive to light.  Cardiovascular:     Rate and Rhythm: Normal rate and regular rhythm.     Heart sounds: No murmur heard. Pulmonary:     Effort: Pulmonary effort is normal.     Breath sounds: No rales.  Abdominal:     General: Bowel sounds are normal.     Palpations: Abdomen is soft.     Tenderness: There is no abdominal tenderness.  Musculoskeletal:     Cervical back: Normal range of motion and neck supple.     Right lower leg: No edema.     Left lower leg: No edema.     Comments:    Skin:    General: Skin is warm and dry.     Findings: Lesion present.     Comments: Left midfoot plantar aspect skin lesion(hx of BCC) about a quarter sized, it seems to me slightly larger, but no observed increased depth of the lesion, no s/s of infection.    Neurological:     General: No focal deficit present.     Mental Status: She is alert. Mental status is at baseline.     Comments: Oriented to person  Psychiatric:        Mood and Affect: Mood normal.        Behavior: Behavior normal.    Labs reviewed: Recent Labs    05/03/20 0000 08/14/20 0000 02/28/21 0000  NA 137 138 138  K 4.2 4.3 4.4  CL 103 102 104  CO2 26* 27* 25*  BUN '15 18 20  '$ CREATININE 1.0 0.9 0.9  CALCIUM 9.0 9.5 8.6*   Recent Labs    05/03/20 0000 08/14/20 0000 02/28/21 0000  AST '13 15 13  '$ ALT '11 12 10  '$ ALKPHOS 70 112 75  ALBUMIN 3.9 4.1 3.8   Recent Labs    05/03/20 0000 08/14/20 0000 02/28/21 0000  WBC 7.5 8.5 7.0  NEUTROABS 4,860  --  4,270.00  HGB 11.8* 12.5 12.0   HCT 35* 37 36  PLT 219 270 210   Lab Results  Component Value Date   TSH 0.75 02/28/2021   No results found for: HGBA1C No results found for: CHOL, HDL, LDLCALC, LDLDIRECT, TRIG, CHOLHDL  Significant Diagnostic Results in last 30 days:  No results found.  Assessment/Plan Basal cell carcinoma of foot, left Left foot plantar skin basal cell carcinoma, saw Dermatology 09/04/20, treated with 5FU, Calcipoteiene cream. Non healing, appears larger in size, but no observed increased depth of the lesion. F/u Dermatology.   Hypothyroidism  takes Levothyroxine, TSH 0.75 02/28/21  GERD Stable, takes Omeprazole. Hgb 12.0 02/28/21  Thoracic compression fracture (HCC) Compression fracture of body of thoracic vertera, pain is controlled on Tylenol, Lidocaine. declined DEXA  Vitamin D deficiency takes Vit D, declined DEXA  HTN (hypertension) Blood pressure is controlled, takes Atenolol, Losartan, Bun/creat 20/0.9 02/28/21  Alzheimer's dementia ([place]) Baseline confusion, no behavioral issues, continue Memantine, Donepezil, SN[facility] for supportive care   Depression due to dementia ([place]) mood is stabilized, takes Sertraline.     Family/ staff Communication: plan of care reviewed with the patient and charge nurse.   Labs/tests ordered:  none  Time spend 35 minutes.

## 2021-03-27 NOTE — Assessment & Plan Note (Signed)
Stable, takes Omeprazole. Hgb 12.0 02/28/21

## 2021-03-27 NOTE — Assessment & Plan Note (Signed)
Baseline confusion, no behavioral issues, continue Memantine, Donepezil, SN[facility] for supportive care

## 2021-03-27 NOTE — Assessment & Plan Note (Signed)
mood is stabilized, takes Sertraline.

## 2021-03-27 NOTE — Assessment & Plan Note (Signed)
Compression fracture of body of thoracic vertera, pain is controlled on Tylenol, Lidocaine. declined DEXA

## 2021-03-27 NOTE — Assessment & Plan Note (Signed)
Left foot plantar skin basal cell carcinoma, saw Dermatology 09/04/20, treated with 5FU, Calcipoteiene cream. Non healing, appears larger in size, but no observed increased depth of the lesion. F/u Dermatology.

## 2021-03-27 NOTE — Assessment & Plan Note (Signed)
Blood pressure is controlled, takes Atenolol, Losartan, Bun/creat 20/0.9 02/28/21

## 2021-03-27 NOTE — Assessment & Plan Note (Signed)
takes Vit D, declined DEXA

## 2021-03-28 DIAGNOSIS — L821 Other seborrheic keratosis: Secondary | ICD-10-CM | POA: Diagnosis not present

## 2021-03-28 DIAGNOSIS — L570 Actinic keratosis: Secondary | ICD-10-CM | POA: Diagnosis not present

## 2021-03-28 DIAGNOSIS — C44712 Basal cell carcinoma of skin of right lower limb, including hip: Secondary | ICD-10-CM | POA: Diagnosis not present

## 2021-03-28 DIAGNOSIS — L814 Other melanin hyperpigmentation: Secondary | ICD-10-CM | POA: Diagnosis not present

## 2021-03-28 DIAGNOSIS — Z85068 Personal history of other malignant neoplasm of small intestine: Secondary | ICD-10-CM | POA: Diagnosis not present

## 2021-04-05 DIAGNOSIS — G309 Alzheimer's disease, unspecified: Secondary | ICD-10-CM | POA: Diagnosis not present

## 2021-04-05 DIAGNOSIS — F329 Major depressive disorder, single episode, unspecified: Secondary | ICD-10-CM | POA: Diagnosis not present

## 2021-04-22 ENCOUNTER — Non-Acute Institutional Stay (SKILLED_NURSING_FACILITY): Payer: PPO | Admitting: Nurse Practitioner

## 2021-04-22 ENCOUNTER — Encounter: Payer: Self-pay | Admitting: Nurse Practitioner

## 2021-04-22 DIAGNOSIS — F32A Depression, unspecified: Secondary | ICD-10-CM

## 2021-04-22 DIAGNOSIS — K219 Gastro-esophageal reflux disease without esophagitis: Secondary | ICD-10-CM | POA: Diagnosis not present

## 2021-04-22 DIAGNOSIS — E559 Vitamin D deficiency, unspecified: Secondary | ICD-10-CM

## 2021-04-22 DIAGNOSIS — C44719 Basal cell carcinoma of skin of left lower limb, including hip: Secondary | ICD-10-CM

## 2021-04-22 DIAGNOSIS — G309 Alzheimer's disease, unspecified: Secondary | ICD-10-CM

## 2021-04-22 DIAGNOSIS — E039 Hypothyroidism, unspecified: Secondary | ICD-10-CM

## 2021-04-22 DIAGNOSIS — I10 Essential (primary) hypertension: Secondary | ICD-10-CM

## 2021-04-22 DIAGNOSIS — S22000D Wedge compression fracture of unspecified thoracic vertebra, subsequent encounter for fracture with routine healing: Secondary | ICD-10-CM | POA: Diagnosis not present

## 2021-04-22 DIAGNOSIS — F0280 Dementia in other diseases classified elsewhere without behavioral disturbance: Secondary | ICD-10-CM

## 2021-04-22 DIAGNOSIS — F0393 Unspecified dementia, unspecified severity, with mood disturbance: Secondary | ICD-10-CM

## 2021-04-22 NOTE — Assessment & Plan Note (Signed)
takes Atenolol, Losartan, Bun/creat 20/0.9 02/28/21

## 2021-04-22 NOTE — Assessment & Plan Note (Signed)
takes Vit D, declined DEXA

## 2021-04-22 NOTE — Assessment & Plan Note (Signed)
takes Donepezil, Memantine, seen neurology 

## 2021-04-22 NOTE — Assessment & Plan Note (Signed)
Left foot plantar skin basal cell carcinoma, saw Dermatology, treated with 5FU, Calcipoteiene cream. he patient's husband c/o skin lesion left foot, non haling. Noted serosanguinous drainage. No redness, tenderness, or warmth in the area. F/u Dermatology.

## 2021-04-22 NOTE — Assessment & Plan Note (Signed)
takes Levothyroxine, TSH 0.75 02/28/21

## 2021-04-22 NOTE — Assessment & Plan Note (Signed)
akes Omeprazole. Hgb 12.0 02/28/21

## 2021-04-22 NOTE — Assessment & Plan Note (Signed)
mood is stabilized, takes Sertraline.

## 2021-04-22 NOTE — Progress Notes (Signed)
Location:   SNF [place] Room Number: [id number] Place of Service:  SNF (31) Provider: [facility] Dovid Bartko NP  [name], MD  Patient Care Team: [name], MD as PCP - General (Internal Medicine)  Extended Emergency Contact Information Primary Emergency Contact: [facility] Address: [address]          [place], [place] [address] [place] Phone: [phone] Relation: Spouse Secondary Emergency Contact: Jalon, [place] Mobile Phone: [phone] Relation: Daughter Interpreter needed? No  Code Status: DNR Goals of care: Advanced Directive information Advanced Directives 04/22/2021  Does Patient Have a Medical Advance Directive? Yes  Type of Paramedic of [place];Out of facility DNR (pink MOST or yellow form)  Does patient want to make changes to medical advance directive? No - Patient declined  Copy of [place] in Chart? Yes - validated most recent copy scanned in chart (See row information)  Pre-existing out of facility DNR order (yellow form or pink MOST form) Yellow form placed in chart (order not valid for inpatient use);Pink MOST form placed in chart (order not valid for inpatient use)     Chief Complaint  Patient presents with   Acute Visit    Patient presents for a non healing left foot lesion     HPI:  Pt is a 87 y.o. female seen today for an acute visit for the patient's husband c/o skin lesion left foot, non haling. Noted serosanguinous drainage. No redness, tenderness, or warmth in the area.   Dementia, takes Donepezil, Memantine, seen neurology             Depression, mood is stabilized, takes Sertraline.              HTN, takes Atenolol, Losartan, Bun/creat 20/0.9 02/28/21             Vit D deficiency, takes Vit D, declined DEXA             Compression fracture of body of thoracic vertera, pain is controlled on Tylenol, Lidocaine. declined DEXA              Hypothyroidism, takes Levothyroxine, TSH 0.75 02/28/21             GERD, takes Omeprazole. Hgb 12.0 02/28/21             Left foot plantar skin basal cell carcinoma, saw Dermatology, treated with 5FU, Calcipoteiene cream.         Past Medical History:    Past Medical History:  Diagnosis Date   Barrett's esophagus    Dementia ([place])    Diverticulosis    Esophageal stricture    GERD (gastroesophageal reflux disease)    History of colonic polyps    History of hysterectomy    Hypertension    Hypothyroidism    Internal hemorrhoids    Kidney stone    pt denies 03/17/14   Memory difficulty 10/17/2015   Primary progressive aphasia ([place]) 01/22/2018   Past Surgical History:  Procedure Laterality Date   APPENDECTOMY     with hysterectomy   BREAST EXCISIONAL BIOPSY     right x 2 1962 and 1982   THYROIDECTOMY, PARTIAL     TONSILLECTOMY      Allergies  Allergen Reactions   Penicillins Nausea Only    Allergies as of 04/22/2021       Reactions   Penicillins Nausea Only  Medication List        Accurate as of April 22, 2021 11:59 PM. If you have any questions, ask your nurse or doctor.          acetaminophen 325 MG tablet Commonly known as: TYLENOL Take 650 mg by mouth every 6 (six) hours as needed.   atenolol 25 MG tablet Commonly known as: TENORMIN Take 25 mg by mouth daily.   cholecalciferol 1000 units tablet Commonly known as: VITAMIN D Take 1,000 Units by mouth daily.   donepezil 5 MG tablet Commonly known as: ARICEPT Take 1 tablet (5 mg total) by mouth 2 (two) times daily.   fluorouracil 5 % cream Commonly known as: EFUDEX Apply topically 2 (two) times daily.   levothyroxine 25 MCG tablet Commonly known as: SYNTHROID Take 25 mcg by mouth. Saturday and Sunday   levothyroxine 50 MCG tablet Commonly known as: SYNTHROID Take by mouth daily. '50mg'$  Monday thru Friday. Saturday and Sunday '25mg'$  (half tablet)   losartan 50 MG tablet Commonly known as:  COZAAR Take 50 mg by mouth 2 (two) times daily. Morning and at 3:00pm   memantine 10 MG tablet Commonly known as: Namenda Take 1 tablet (10 mg total) by mouth 2 (two) times daily.   omeprazole 20 MG capsule Commonly known as: PRILOSEC Take 20 mg by mouth daily.   PRESERVISION AREDS 2 PO Take 2 tablets by mouth daily.   sertraline 25 MG tablet Commonly known as: ZOLOFT Take 25 mg by mouth daily.   sodium fluoride 1.1 % Gel dental gel Commonly known as: FLUORISHIELD Place 1 application onto teeth at bedtime.        Review of Systems  Constitutional:  Negative for fatigue and fever.  HENT:  Positive for hearing loss. Negative for congestion and voice change.   Eyes:  Negative for visual disturbance.  Respiratory:  Negative for shortness of breath.   Cardiovascular:  Negative for leg swelling.  Gastrointestinal:  Negative for abdominal pain and constipation.  Genitourinary:  Negative for difficulty urinating, dysuria and urgency.  Musculoskeletal:  Positive for gait problem.  Skin:  Negative for color change.       Left midfoot plantar aspect skin lesion(hx of BCC about a quarter sized open area.   Neurological:  Positive for speech difficulty. Negative for weakness and light-headedness.       Memory lapses. Difficulty wards finding.   Psychiatric/Behavioral:  Positive for confusion. Negative for behavioral problems and sleep disturbance. The patient is not nervous/anxious.    Immunization History  Administered Date(s) Administered   Influenza-Unspecified 06/06/2020   Moderna SARS-COV2 Booster Vaccination 01/22/2021   Moderna Sars-Covid-2 Vaccination 08/26/2019, 09/26/2019, 07/03/2020   Pneumococcal Polysaccharide-23 08/03/2013   Zoster Recombinat (Shingrix) 01/06/2018, 03/11/2018   Pertinent  Health Maintenance Due  Topic Date Due   DEXA SCAN  Never done   PNA vac Low Risk Adult (2 of 2 - PCV13) 08/03/2014   INFLUENZA VACCINE  03/25/2021   Fall Risk  01/22/2018  01/08/2017  Falls in the past year? No No   Functional Status Survey:    Vitals:   04/22/21 1623  BP: 120/62  Pulse: 70  Resp: 20  Temp: 97.9 F (36.6 C)  SpO2: 96%  Weight: 96 lb 9.6 oz (43.8 kg)  Height: '4\' 10"'$  (1.473 m)   Body mass index is 20.19 kg/m. Physical Exam Vitals reviewed.  Constitutional:      Appearance: Normal appearance.  HENT:     Head: Normocephalic and atraumatic.  Mouth/Throat:     Mouth: Mucous membranes are moist.  Eyes:     Conjunctiva/sclera: Conjunctivae normal.     Pupils: Pupils are equal, round, and reactive to light.  Cardiovascular:     Rate and Rhythm: Normal rate and regular rhythm.     Heart sounds: No murmur heard. Pulmonary:     Effort: Pulmonary effort is normal.     Breath sounds: No rales.  Abdominal:     General: Bowel sounds are normal.     Palpations: Abdomen is soft.     Tenderness: There is no abdominal tenderness.  Musculoskeletal:     Cervical back: Normal range of motion and neck supple.     Right lower leg: No edema.     Left lower leg: No edema.     Comments:    Skin:    General: Skin is warm and dry.     Findings: Lesion present.     Comments: Left midfoot plantar aspect skin lesion(hx of BCC) about a quarter sized, it seems to me slightly larger, but no observed increased depth of the lesion, no s/s of infection.    Neurological:     General: No focal deficit present.     Mental Status: She is alert. Mental status is at baseline.     Comments: Oriented to person  Psychiatric:        Mood and Affect: Mood normal.        Behavior: Behavior normal.    Labs reviewed: Recent Labs    05/03/20 0000 08/14/20 0000 02/28/21 0000  NA 137 138 138  K 4.2 4.3 4.4  CL 103 102 104  CO2 26* 27* 25*  BUN '15 18 20  '$ CREATININE 1.0 0.9 0.9  CALCIUM 9.0 9.5 8.6*   Recent Labs    05/03/20 0000 08/14/20 0000 02/28/21 0000  AST '13 15 13  '$ ALT '11 12 10  '$ ALKPHOS 70 112 75  ALBUMIN 3.9 4.1 3.8   Recent Labs     05/03/20 0000 08/14/20 0000 02/28/21 0000  WBC 7.5 8.5 7.0  NEUTROABS 4,860  --  4,270.00  HGB 11.8* 12.5 12.0  HCT 35* 37 36  PLT 219 270 210   Lab Results  Component Value Date   TSH 0.75 02/28/2021   No results found for: HGBA1C No results found for: CHOL, HDL, LDLCALC, LDLDIRECT, TRIG, CHOLHDL  Significant Diagnostic Results in last 30 days:  No results found.  Assessment/Plan: Basal cell carcinoma of foot, left Left foot plantar skin basal cell carcinoma, saw Dermatology, treated with 5FU, Calcipoteiene cream. he patient's husband c/o skin lesion left foot, non haling. Noted serosanguinous drainage. No redness, tenderness, or warmth in the area. F/u Dermatology.   Alzheimer's dementia ([place]) takes Donepezil, Memantine, seen neurology  Depression due to dementia [facility]) mood is stabilized, takes Sertraline.   HTN (hypertension) takes Atenolol, Losartan, Bun/creat 20/0.9 02/28/21  Vitamin D deficiency takes Vit D, declined DEXA  Thoracic compression fracture ([facility]) Compression fracture of body of thoracic vertera, pain is controlled on Tylenol, Lidocaine. declined DEXA  Hypothyroidism takes Levothyroxine, TSH 0.75 02/28/21  GERD akes Omeprazole. Hgb 12.0 02/28/21    Family/ staff Communication: plan of care reviewed with the patient and charge nurse.   Labs/tests ordered:  none  Time spend 25 minutes.

## 2021-04-22 NOTE — Assessment & Plan Note (Signed)
Compression fracture of body of thoracic vertera, pain is controlled on Tylenol, Lidocaine. declined DEXA

## 2021-04-23 ENCOUNTER — Encounter: Payer: Self-pay | Admitting: Nurse Practitioner

## 2021-05-07 ENCOUNTER — Non-Acute Institutional Stay (SKILLED_NURSING_FACILITY): Payer: PPO | Admitting: Internal Medicine

## 2021-05-07 DIAGNOSIS — S22000D Wedge compression fracture of unspecified thoracic vertebra, subsequent encounter for fracture with routine healing: Secondary | ICD-10-CM | POA: Diagnosis not present

## 2021-05-07 DIAGNOSIS — F0280 Dementia in other diseases classified elsewhere without behavioral disturbance: Secondary | ICD-10-CM

## 2021-05-07 DIAGNOSIS — E559 Vitamin D deficiency, unspecified: Secondary | ICD-10-CM | POA: Diagnosis not present

## 2021-05-07 DIAGNOSIS — D485 Neoplasm of uncertain behavior of skin: Secondary | ICD-10-CM | POA: Diagnosis not present

## 2021-05-07 DIAGNOSIS — F32A Depression, unspecified: Secondary | ICD-10-CM

## 2021-05-07 DIAGNOSIS — L820 Inflamed seborrheic keratosis: Secondary | ICD-10-CM | POA: Diagnosis not present

## 2021-05-07 DIAGNOSIS — C44719 Basal cell carcinoma of skin of left lower limb, including hip: Secondary | ICD-10-CM

## 2021-05-07 DIAGNOSIS — K219 Gastro-esophageal reflux disease without esophagitis: Secondary | ICD-10-CM

## 2021-05-07 DIAGNOSIS — E039 Hypothyroidism, unspecified: Secondary | ICD-10-CM | POA: Diagnosis not present

## 2021-05-07 DIAGNOSIS — I10 Essential (primary) hypertension: Secondary | ICD-10-CM | POA: Diagnosis not present

## 2021-05-07 DIAGNOSIS — F0393 Unspecified dementia, unspecified severity, with mood disturbance: Secondary | ICD-10-CM

## 2021-05-07 DIAGNOSIS — G309 Alzheimer's disease, unspecified: Secondary | ICD-10-CM | POA: Diagnosis not present

## 2021-05-07 DIAGNOSIS — L821 Other seborrheic keratosis: Secondary | ICD-10-CM | POA: Diagnosis not present

## 2021-05-07 NOTE — Progress Notes (Signed)
Location:   [place] Room Number: 3 Place of Service:  SNF [phone]) Provider:  Veleta Miners MD  [name], MD  Patient Care Team: [name], MD as PCP - General (Internal Medicine)  Extended Emergency Contact Information Primary Emergency Contact: [facility] Address: [address]          [place], [place] [address] [place] Phone: [phone] Relation: Spouse Secondary Emergency Contact: Trixy, [place] Mobile Phone: [phone] Relation: Daughter Interpreter needed? No  Code Status:  DNR Managed Care Goals of care: Advanced Directive information Advanced Directives 05/07/2021  Does Patient Have a Medical Advance Directive? Yes  Type of Paramedic of [place];Out of facility DNR (pink MOST or yellow form)  Does patient want to make changes to medical advance directive? No - Patient declined  Copy of [place] in Chart? Yes - validated most recent copy scanned in chart (See row information)  Pre-existing out of facility DNR order (yellow form or pink MOST form) Yellow form placed in chart (order not valid for inpatient use);Pink MOST form placed in chart (order not valid for inpatient use)     Chief Complaint  Patient presents with   Medical Management of Chronic Issues   Quality Metric Gaps    Tdap, dexa scan,  pcv13, flu shot    HPI:  Pt is a 87 y.o. female seen today for medical management of chronic diseases.    Has history of hypertension, hypothyroidism, and GERD, left plantar basal cell cancer, Alzheimer's dementia with aphasia  T 5 Compression Fracture and L2 Compression Osteoporosis Depression  Doing well Weight stable No falls Behavior stable Walks with no assist Husband in room today Her acute problem today is Non Healing skin lesion with discharge in  Left foot plantar area which most likely is Basal Cell Dermatology is planning  to do Biopsy. Patient says some Discomfort when she walks It was treated Conservatively before with 5 FU Per Dermatology if Biopsy positive will need Mohrs    Past Medical History:  Diagnosis Date   Barrett's esophagus    Dementia ([place])    Diverticulosis    Esophageal stricture    GERD (gastroesophageal reflux disease)    History of colonic polyps    History of hysterectomy    Hypertension    Hypothyroidism    Internal hemorrhoids    Kidney stone    pt denies 03/17/14   Memory difficulty 10/17/2015   Primary progressive aphasia ([place]) 01/22/2018   Past Surgical History:  Procedure Laterality Date   APPENDECTOMY     with hysterectomy   BREAST EXCISIONAL BIOPSY     right x 2 1962 and 1982   THYROIDECTOMY, PARTIAL     TONSILLECTOMY      Allergies  Allergen Reactions   Penicillins Nausea Only    Allergies as of 05/07/2021       Reactions   Penicillins Nausea Only        Medication List        Accurate as of May 07, 2021  2:43 PM. If you have any questions, ask your nurse or doctor.          acetaminophen 325 MG tablet Commonly known as: TYLENOL Take 650 mg by mouth every 6 (six) hours as needed.   atenolol 25 MG tablet Commonly known as: TENORMIN Take 25 mg by mouth daily.  cholecalciferol 1000 units tablet Commonly known as: VITAMIN D Take 1,000 Units by mouth daily.   donepezil 5 MG tablet Commonly known as: ARICEPT Take 1 tablet (5 mg total) by mouth 2 (two) times daily.   fluorouracil 5 % cream Commonly known as: EFUDEX Apply topically 2 (two) times daily.   levothyroxine 25 MCG tablet Commonly known as: SYNTHROID Take 25 mcg by mouth. Saturday and Sunday   levothyroxine 50 MCG tablet Commonly known as: SYNTHROID Take by mouth daily. '50mg'$  Monday thru Friday. Saturday and Sunday '25mg'$  (half tablet)   losartan 50 MG tablet Commonly known as: COZAAR Take 50 mg by mouth 2 (two) times daily. Morning and at 3:00pm   memantine 10 MG  tablet Commonly known as: Namenda Take 1 tablet (10 mg total) by mouth 2 (two) times daily.   omeprazole 20 MG capsule Commonly known as: PRILOSEC Take 20 mg by mouth daily.   PRESERVISION AREDS 2 PO Take 2 tablets by mouth daily.   sertraline 25 MG tablet Commonly known as: ZOLOFT Take 25 mg by mouth daily.   sodium fluoride 1.1 % Gel dental gel Commonly known as: FLUORISHIELD Place 1 application onto teeth at bedtime.        Review of Systems  Unable to perform ROS: Dementia   Immunization History  Administered Date(s) Administered   Influenza-Unspecified 06/06/2020   Moderna SARS-COV2 Booster Vaccination 01/22/2021   Moderna Sars-Covid-2 Vaccination 08/26/2019, 09/26/2019, 07/03/2020   Pneumococcal Polysaccharide-23 08/03/2013   Zoster Recombinat (Shingrix) 01/06/2018, 03/11/2018   Pertinent  Health Maintenance Due  Topic Date Due   DEXA SCAN  Never done   PNA vac Low Risk Adult (2 of 2 - PCV13) 08/03/2014   INFLUENZA VACCINE  03/25/2021   Fall Risk  01/22/2018 01/08/2017  Falls in the past year? No No   Functional Status Survey:    Vitals:   05/07/21 1436  BP: 118/75  Pulse: 61  Resp: 18  Temp: 97.7 F (36.5 C)  SpO2: 95%  Weight: 96 lb 4.8 oz (43.7 kg)  Height: '4\' 10"'$  (1.473 m)   Body mass index is 20.13 kg/m. Physical Exam Vitals reviewed.  Constitutional:      Appearance: Normal appearance.     Comments: Very frail  HENT:     Head: Normocephalic.     Nose: Nose normal.     Mouth/Throat:     Mouth: Mucous membranes are moist.     Pharynx: Oropharynx is clear.  Eyes:     Pupils: Pupils are equal, round, and reactive to light.  Cardiovascular:     Rate and Rhythm: Normal rate and regular rhythm.     Pulses: Normal pulses.  Pulmonary:     Effort: Pulmonary effort is normal.     Breath sounds: Normal breath sounds.  Abdominal:     General: Abdomen is flat. Bowel sounds are normal.     Palpations: Abdomen is soft.  Musculoskeletal:         General: No swelling.     Cervical back: Neck supple.  Skin:    General: Skin is warm.     Comments: A clean Wound in [place] area. With Clean base and Margin No Signs of infection Mild discharge  Neurological:     General: No focal deficit present.     Mental Status: She is alert.  Psychiatric:        Mood and Affect: Mood normal.        Thought Content: Thought content normal.  Labs reviewed: Recent Labs    08/14/20 0000 02/28/21 0000  NA 138 138  K 4.3 4.4  CL 102 104  CO2 27* 25*  BUN 18 20  CREATININE 0.9 0.9  CALCIUM 9.5 8.6*   Recent Labs    08/14/20 0000 02/28/21 0000  AST 15 13  ALT 12 10  ALKPHOS 112 75  ALBUMIN 4.1 3.8   Recent Labs    08/14/20 0000 02/28/21 0000  WBC 8.5 7.0  NEUTROABS  --  4,270.00  HGB 12.5 12.0  HCT 37 36  PLT 270 210   Lab Results  Component Value Date   TSH 0.75 02/28/2021   No results found for: HGBA1C No results found for: CHOL, HDL, LDLCALC, LDLDIRECT, TRIG, CHOLHDL  Significant Diagnostic Results in last 30 days:  No results found.  Assessment/Plan Basal cell carcinoma of foot, left D/w Dermatology in the room They are going to do Biopsy of the Margins today and if they are positive will consider Mohrs Surgery Alzheimer's dementia without behavioral disturbance, unspecified timing of dementia onset ([place]) On Namenda and Aricept Depression due to dementia [facility]) Doing well on Zoloft Hypertension,  On Tenormin and Cozaar Vitamin D deficiency On Supplement Compression fracture of thoracic vertebra with routine healing, unspecified thoracic vertebral level, subsequent encounter Pain is stable Refused Further work up for Osteoporosis or treatment Hypothyroidism, unspecified type TSH normal in 7/22 Gastroesophageal reflux disease without esophagitis On Prilosec   Family/ staff Communication:   Labs/tests ordered:

## 2021-05-09 DIAGNOSIS — F329 Major depressive disorder, single episode, unspecified: Secondary | ICD-10-CM | POA: Diagnosis not present

## 2021-05-09 DIAGNOSIS — G309 Alzheimer's disease, unspecified: Secondary | ICD-10-CM | POA: Diagnosis not present

## 2021-06-05 DIAGNOSIS — C44719 Basal cell carcinoma of skin of left lower limb, including hip: Secondary | ICD-10-CM | POA: Diagnosis not present

## 2021-06-06 DIAGNOSIS — G309 Alzheimer's disease, unspecified: Secondary | ICD-10-CM | POA: Diagnosis not present

## 2021-06-06 DIAGNOSIS — F329 Major depressive disorder, single episode, unspecified: Secondary | ICD-10-CM | POA: Diagnosis not present

## 2021-06-11 ENCOUNTER — Encounter: Payer: Self-pay | Admitting: Internal Medicine

## 2021-06-11 ENCOUNTER — Non-Acute Institutional Stay (SKILLED_NURSING_FACILITY): Payer: PPO | Admitting: Internal Medicine

## 2021-06-11 DIAGNOSIS — C44719 Basal cell carcinoma of skin of left lower limb, including hip: Secondary | ICD-10-CM

## 2021-06-11 DIAGNOSIS — S22000D Wedge compression fracture of unspecified thoracic vertebra, subsequent encounter for fracture with routine healing: Secondary | ICD-10-CM | POA: Diagnosis not present

## 2021-06-11 DIAGNOSIS — I10 Essential (primary) hypertension: Secondary | ICD-10-CM | POA: Diagnosis not present

## 2021-06-11 DIAGNOSIS — E039 Hypothyroidism, unspecified: Secondary | ICD-10-CM | POA: Diagnosis not present

## 2021-06-11 DIAGNOSIS — F0393 Unspecified dementia, unspecified severity, with mood disturbance: Secondary | ICD-10-CM

## 2021-06-11 DIAGNOSIS — K219 Gastro-esophageal reflux disease without esophagitis: Secondary | ICD-10-CM

## 2021-06-11 NOTE — Progress Notes (Signed)
Location:   [facility] Room Number: 3 Place of Service:  SNF [phone]) Provider:  Veleta Miners MD  [name], MD  Patient Care Team: [name], MD as PCP - General (Internal Medicine)  Extended Emergency Contact Information Primary Emergency Contact: [facility] Address: [address]          [place], [place] [address] [place] Phone: [phone] Relation: Spouse Secondary Emergency Contact: [place], [place] Mobile Phone: [phone] Relation: Daughter Interpreter needed? No  Code Status:  DNR Managed Care Goals of care: Advanced Directive information Advanced Directives 06/11/2021  Does Patient Have a Medical Advance Directive? Yes  Type of Paramedic of [place];Out of facility DNR (pink MOST or yellow form)  Does patient want to make changes to medical advance directive? No - Patient declined  Copy of [place] in Chart? Yes - validated most recent copy scanned in chart (See row information)  Pre-existing out of facility DNR order (yellow form or pink MOST form) Yellow form placed in chart (order not valid for inpatient use);Pink MOST form placed in chart (order not valid for inpatient use)     Chief Complaint  Patient presents with   Medical Management of Chronic Issues   Quality Metric Gaps    TDAP, Dexa scan, Flu shot    HPI:  Pt is a 87 y.o. female seen today for medical management of chronic diseases.    Has history of hypertension, hypothyroidism, and GERD, left plantar basal cell cancer, Alzheimer's dementia with aphasia  T 5 Compression Fracture and L2 Compression Osteoporosis. Refused Therapy with Prolia Depression  Patient will need to undergo Mohrs Surgery per Dermatology for her Basal cell cancer in her Foot Which was non healing lesion. It was treated Conservatively before with 5 FU  Dementia with Aphasia Stable  No Falls Weight  is stable No Behavior issues. Walks with no assist Very supportive husband    Past Medical History:  Diagnosis Date   Barrett's esophagus    Dementia ([place])    Diverticulosis    Esophageal stricture    GERD (gastroesophageal reflux disease)    History of colonic polyps    History of hysterectomy    Hypertension    Hypothyroidism    Internal hemorrhoids    Kidney stone    pt denies 03/17/14   Memory difficulty 10/17/2015   Primary progressive aphasia ([place]) 01/22/2018   Past Surgical History:  Procedure Laterality Date   APPENDECTOMY     with hysterectomy   BREAST EXCISIONAL BIOPSY     right x 2 1962 and 1982   THYROIDECTOMY, PARTIAL     TONSILLECTOMY      Allergies  Allergen Reactions   Penicillins Nausea Only    Allergies as of 06/11/2021       Reactions   Penicillins Nausea Only        Medication List        Accurate as of June 11, 2021 11:46 AM. If you have any questions, ask your nurse or doctor.          STOP taking these medications    fluorouracil 5 % cream Commonly known as: EFUDEX Stopped by: [name], MD   PRESERVISION AREDS 2 PO Stopped by: [name], MD       TAKE these medications    acetaminophen 325 MG tablet Commonly known  as: TYLENOL Take 650 mg by mouth every 6 (six) hours as needed.   atenolol 25 MG tablet Commonly known as: TENORMIN Take 25 mg by mouth daily.   cholecalciferol 1000 units tablet Commonly known as: VITAMIN D Take 1,000 Units by mouth daily.   donepezil 5 MG tablet Commonly known as: ARICEPT Take 1 tablet (5 mg total) by mouth 2 (two) times daily.   levothyroxine 25 MCG tablet Commonly known as: SYNTHROID Take 25 mcg by mouth. Saturday and Sunday   levothyroxine 50 MCG tablet Commonly known as: SYNTHROID Take by mouth daily. 50mg  Monday thru Friday. Saturday and Sunday 25mg  (half tablet)   losartan 50 MG tablet Commonly known as: COZAAR Take 50 mg by mouth 2 (two) times daily.  Morning and at 3:00pm   memantine 10 MG tablet Commonly known as: Namenda Take 1 tablet (10 mg total) by mouth 2 (two) times daily.   omeprazole 20 MG capsule Commonly known as: PRILOSEC Take 20 mg by mouth daily.   sertraline 25 MG tablet Commonly known as: ZOLOFT Take 25 mg by mouth daily.   sodium fluoride 1.1 % Gel dental gel Commonly known as: FLUORISHIELD Place 1 application onto teeth at bedtime.        Review of Systems  Unable to perform ROS: Dementia   Immunization History  Administered Date(s) Administered   Influenza-Unspecified 06/06/2020   Moderna SARS-COV2 Booster Vaccination 01/22/2021   Moderna Sars-Covid-2 Vaccination 08/26/2019, 09/26/2019, 07/03/2020   PFIZER(Purple Top)SARS-COV-2 Vaccination 05/14/2021   Pneumococcal Polysaccharide-23 08/03/2013   Zoster Recombinat (Shingrix) 01/06/2018, 03/11/2018   Pertinent  Health Maintenance Due  Topic Date Due   DEXA SCAN  Never done   INFLUENZA VACCINE  03/25/2021   Fall Risk  01/22/2018 01/08/2017  Falls in the past year? No No   Functional Status Survey:    Vitals:   06/11/21 1142  BP: 99/63  Pulse: 64  Resp: 18  Temp: 98.2 F (36.8 C)  SpO2: 94%  Weight: 97 lb (44 kg)  Height: 4\' 10"  (1.473 m)   Body mass index is 20.27 kg/m. Physical Exam Vitals reviewed.  Constitutional:      Comments: Very frail Has aphasia  HENT:     Head: Normocephalic.     Nose: Nose normal.     Mouth/Throat:     Mouth: Mucous membranes are moist.     Pharynx: Oropharynx is clear.  Eyes:     Pupils: Pupils are equal, round, and reactive to light.  Cardiovascular:     Rate and Rhythm: Normal rate and regular rhythm.     Pulses: Normal pulses.     Heart sounds: Normal heart sounds.  Pulmonary:     Effort: Pulmonary effort is normal.     Breath sounds: Normal breath sounds.  Abdominal:     General: Abdomen is flat. Bowel sounds are normal.     Palpations: Abdomen is soft.  Musculoskeletal:         General: No swelling.     Cervical back: Neck supple.  Skin:    General: Skin is warm.  Neurological:     General: No focal deficit present.     Mental Status: She is alert.     Comments: Alert and Has aphasia Walks with no assist  Psychiatric:        Mood and Affect: Mood normal.        Thought Content: Thought content normal.    Labs reviewed: Recent Labs    08/14/20 0000  02/28/21 0000  NA 138 138  K 4.3 4.4  CL 102 104  CO2 27* 25*  BUN 18 20  CREATININE 0.9 0.9  CALCIUM 9.5 8.6*   Recent Labs    08/14/20 0000 02/28/21 0000  AST 15 13  ALT 12 10  ALKPHOS 112 75  ALBUMIN 4.1 3.8   Recent Labs    08/14/20 0000 02/28/21 0000  WBC 8.5 7.0  NEUTROABS  --  4,270.00  HGB 12.5 12.0  HCT 37 36  PLT 270 210   Lab Results  Component Value Date   TSH 0.75 02/28/2021   No results found for: HGBA1C No results found for: CHOL, HDL, LDLCALC, LDLDIRECT, TRIG, CHOLHDL  Significant Diagnostic Results in last 30 days:  No results found.  Assessment/Plan Basal cell carcinoma of foot, left Plan for Mohrs per dermatology Depression due to dementia Doing well on Zoloft Hypertension, unspecified type On Lopressor and Cozaar BP low today But most other readings are good Will check get more readings Compression fracture of thoracic vertebra with routine healing,  Refused further treatment No Pain  On Tylenol Hypothyroidism, unspecified type TSH normal in 07/07  Gastroesophageal reflux disease without esophagitis On Prilosec Alzheimer's dementia without behavioral disturbance, unspecified timing of dementia onset ([place]) On Namenda and Aricept  Family/ staff Communication:   Labs/tests ordered:

## 2021-06-18 DIAGNOSIS — L820 Inflamed seborrheic keratosis: Secondary | ICD-10-CM | POA: Diagnosis not present

## 2021-06-18 DIAGNOSIS — C44719 Basal cell carcinoma of skin of left lower limb, including hip: Secondary | ICD-10-CM | POA: Diagnosis not present

## 2021-06-18 DIAGNOSIS — L570 Actinic keratosis: Secondary | ICD-10-CM | POA: Diagnosis not present

## 2021-06-18 DIAGNOSIS — Z85068 Personal history of other malignant neoplasm of small intestine: Secondary | ICD-10-CM | POA: Diagnosis not present

## 2021-07-08 ENCOUNTER — Non-Acute Institutional Stay (SKILLED_NURSING_FACILITY): Payer: PPO | Admitting: Nurse Practitioner

## 2021-07-08 ENCOUNTER — Encounter: Payer: Self-pay | Admitting: Nurse Practitioner

## 2021-07-08 DIAGNOSIS — E039 Hypothyroidism, unspecified: Secondary | ICD-10-CM

## 2021-07-08 DIAGNOSIS — K219 Gastro-esophageal reflux disease without esophagitis: Secondary | ICD-10-CM

## 2021-07-08 DIAGNOSIS — G309 Alzheimer's disease, unspecified: Secondary | ICD-10-CM | POA: Diagnosis not present

## 2021-07-08 DIAGNOSIS — C44719 Basal cell carcinoma of skin of left lower limb, including hip: Secondary | ICD-10-CM

## 2021-07-08 DIAGNOSIS — E559 Vitamin D deficiency, unspecified: Secondary | ICD-10-CM | POA: Diagnosis not present

## 2021-07-08 DIAGNOSIS — S22000D Wedge compression fracture of unspecified thoracic vertebra, subsequent encounter for fracture with routine healing: Secondary | ICD-10-CM | POA: Diagnosis not present

## 2021-07-08 DIAGNOSIS — F02C0 Dementia in other diseases classified elsewhere, severe, without behavioral disturbance, psychotic disturbance, mood disturbance, and anxiety: Secondary | ICD-10-CM

## 2021-07-08 DIAGNOSIS — F0393 Unspecified dementia, unspecified severity, with mood disturbance: Secondary | ICD-10-CM | POA: Diagnosis not present

## 2021-07-08 DIAGNOSIS — I10 Essential (primary) hypertension: Secondary | ICD-10-CM

## 2021-07-08 NOTE — Assessment & Plan Note (Signed)
mood is stabilized, takes Sertraline.

## 2021-07-08 NOTE — Assessment & Plan Note (Signed)
takes Donepezil, Memantine, seen neurology 

## 2021-07-08 NOTE — Assessment & Plan Note (Signed)
pain is controlled on Tylenol, Lidocaine. declined DEXA

## 2021-07-08 NOTE — Assessment & Plan Note (Signed)
saw Dermatology, treated with 5FU, Calcipoteiene cream. Pending Mohrs

## 2021-07-08 NOTE — Assessment & Plan Note (Signed)
takes Vit D, declined DEXA

## 2021-07-08 NOTE — Assessment & Plan Note (Signed)
takes Omeprazole. Hgb 12.0 02/28/21

## 2021-07-08 NOTE — Progress Notes (Signed)
Location:   SNF [place] Room Number: 3 A Place of Service:  SNF (31) Provider: [facility] Jorie Zee NP  [name], MD  Patient Care Team: [name], MD as PCP - General (Internal Medicine)  Extended Emergency Contact Information Primary Emergency Contact: [facility] Address: [address]          [place], [place] [address] [place] Phone: [phone] Relation: Spouse Secondary Emergency Contact: [place], [place] Mobile Phone: [phone] Relation: Daughter Interpreter needed? No  Code Status:  DNR Goals of care: Advanced Directive information Advanced Directives 07/08/2021  Does Patient Have a Medical Advance Directive? Yes  Type of Paramedic of [place];Out of facility DNR (pink MOST or yellow form)  Does patient want to make changes to medical advance directive? No - Patient declined  Copy of [place] in Chart? Yes - validated most recent copy scanned in chart (See row information)  Pre-existing out of facility DNR order (yellow form or pink MOST form) Yellow form placed in chart (order not valid for inpatient use);Pink MOST form placed in chart (order not valid for inpatient use)     Chief Complaint  Patient presents with   Medical Management of Chronic Issues    Routine Visit   Quality Metric Gaps    Tdap, Pneumonia Vaccine     HPI:  Pt is a 87 y.o. female seen today for medical management of chronic diseases.      Dementia, takes Donepezil, Memantine, seen neurology             Depression, mood is stabilized, takes Sertraline.              HTN, takes Atenolol, Losartan, Bun/creat 20/0.9 02/28/21             Vit D deficiency, takes Vit D, declined DEXA             Compression fracture of body of thoracic vertera, pain is controlled on Tylenol, Lidocaine. declined DEXA             Hypothyroidism, takes Levothyroxine, TSH 0.75 02/28/21             GERD, takes  Omeprazole. Hgb 12.0 02/28/21             Left foot plantar skin basal cell carcinoma, saw Dermatology, treated with 5FU, Calcipoteiene cream. Pending Mohrs    Past Medical History:  Diagnosis Date   Barrett's esophagus    Dementia ([place])    Diverticulosis    Esophageal stricture    GERD (gastroesophageal reflux disease)    History of colonic polyps    History of hysterectomy    Hypertension    Hypothyroidism    Internal hemorrhoids    Kidney stone    pt denies 03/17/14   Memory difficulty 10/17/2015   Primary progressive aphasia ([place]) 01/22/2018   Past Surgical History:  Procedure Laterality Date   APPENDECTOMY     with hysterectomy   BREAST EXCISIONAL BIOPSY     right x 2 1962 and 1982   THYROIDECTOMY, PARTIAL     TONSILLECTOMY      Allergies  Allergen Reactions   Penicillins Nausea Only    Allergies as of 07/08/2021       Reactions   Penicillins Nausea Only        Medication List        Accurate as  of July 08, 2021 11:59 PM. If you have any questions, ask your nurse or doctor.          acetaminophen 325 MG tablet Commonly known as: TYLENOL Take 650 mg by mouth every 6 (six) hours as needed.   atenolol 25 MG tablet Commonly known as: TENORMIN Take 25 mg by mouth daily.   cholecalciferol 1000 units tablet Commonly known as: VITAMIN D Take 1,000 Units by mouth daily.   donepezil 5 MG tablet Commonly known as: ARICEPT Take 1 tablet (5 mg total) by mouth 2 (two) times daily.   levothyroxine 25 MCG tablet Commonly known as: SYNTHROID Take 25 mcg by mouth. Saturday and Sunday   levothyroxine 50 MCG tablet Commonly known as: SYNTHROID Take by mouth daily. 50mg  Monday thru Friday. Saturday and Sunday 25mg  (half tablet)   losartan 25 MG tablet Commonly known as: COZAAR Take 25 mg by mouth 2 (two) times daily. Morning and at 3:00pm   memantine 10 MG tablet Commonly known as: Namenda Take 1 tablet (10 mg total) by mouth 2 (two) times daily.    omeprazole 20 MG capsule Commonly known as: PRILOSEC Take 20 mg by mouth daily.   sertraline 25 MG tablet Commonly known as: ZOLOFT Take 25 mg by mouth daily.   sodium fluoride 1.1 % Gel dental gel Commonly known as: FLUORISHIELD Place 1 application onto teeth at bedtime.        Review of Systems  Unable to perform ROS: Dementia   Immunization History  Administered Date(s) Administered   Influenza-Unspecified 06/06/2020, 06/12/2021   Moderna SARS-COV2 Booster Vaccination 01/22/2021   Moderna Sars-Covid-2 Vaccination 08/26/2019, 09/26/2019, 07/03/2020   PFIZER(Purple Top)SARS-COV-2 Vaccination 05/14/2021   Pneumococcal Polysaccharide-23 08/03/2013   Zoster Recombinat (Shingrix) 01/06/2018, 03/11/2018   Pertinent  Health Maintenance Due  Topic Date Due   DEXA SCAN  Never done   INFLUENZA VACCINE  Completed   Fall Risk 01/08/2017 01/22/2018 02/24/2020  Falls in the past year? No No -  Patient Fall Risk Level - - Low fall risk   Functional Status Survey:    Vitals:   07/08/21 1453  BP: 118/68  Pulse: 70  Resp: 12  Temp: (!) 97.2 F (36.2 C)  SpO2: 94%  Weight: 98 lb 1.6 oz (44.5 kg)  Height: 4\' 10"  (1.473 m)   Body mass index is 20.5 kg/m. Physical Exam Vitals reviewed.  Constitutional:      Appearance: Normal appearance.  HENT:     Head: Normocephalic and atraumatic.     Mouth/Throat:     Mouth: Mucous membranes are moist.  Eyes:     Conjunctiva/sclera: Conjunctivae normal.     Pupils: Pupils are equal, round, and reactive to light.  Cardiovascular:     Rate and Rhythm: Normal rate and regular rhythm.     Heart sounds: No murmur heard. Pulmonary:     Effort: Pulmonary effort is normal.     Breath sounds: No rales.  Abdominal:     General: Bowel sounds are normal.     Palpations: Abdomen is soft.     Tenderness: There is no abdominal tenderness.  Musculoskeletal:     Cervical back: Normal range of motion and neck supple.     Right lower leg: No  edema.     Left lower leg: No edema.     Comments:    Skin:    General: Skin is warm and dry.     Findings: Lesion present.     Comments: Left  midfoot plantar aspect skin lesion(hx of BCC) about a quarter sized, it seems to me slightly larger, but no observed increased depth of the lesion, no s/s of infection.    Neurological:     General: No focal deficit present.     Mental Status: She is alert. Mental status is at baseline.     Comments: Oriented to person  Psychiatric:        Mood and Affect: Mood normal.        Behavior: Behavior normal.    Labs reviewed: Recent Labs    08/14/20 0000 02/28/21 0000  NA 138 138  K 4.3 4.4  CL 102 104  CO2 27* 25*  BUN 18 20  CREATININE 0.9 0.9  CALCIUM 9.5 8.6*   Recent Labs    08/14/20 0000 02/28/21 0000  AST 15 13  ALT 12 10  ALKPHOS 112 75  ALBUMIN 4.1 3.8   Recent Labs    08/14/20 0000 02/28/21 0000  WBC 8.5 7.0  NEUTROABS  --  4,270.00  HGB 12.5 12.0  HCT 37 36  PLT 270 210   Lab Results  Component Value Date   TSH 0.75 02/28/2021   No results found for: HGBA1C No results found for: CHOL, HDL, LDLCALC, LDLDIRECT, TRIG, CHOLHDL  Significant Diagnostic Results in last 30 days:  No results found.  Assessment/Plan  HTN (hypertension) Blood pressure is controlled,  takes Atenolol, Losartan, Bun/creat 20/0.9 02/28/21  Vitamin D deficiency  takes Vit D, declined DEXA  Thoracic compression fracture (HCC)  pain is controlled on Tylenol, Lidocaine. declined DEXA  Hypothyroidism takes Levothyroxine, TSH 0.75 02/28/21  GERD takes Omeprazole. Hgb 12.0 02/28/21  Basal cell carcinoma of foot, left saw Dermatology, treated with 5FU, Calcipoteiene cream. Pending Mohrs    Depression due to dementia ([place]) mood is stabilized, takes Sertraline.   Alzheimer's dementia ([place])  takes Donepezil, Memantine, seen neurology   Family/ staff Communication: plan of care reviewed with the patient and charge nurse.    Labs/tests ordered:  none  Time spend 35 minutes.

## 2021-07-08 NOTE — Assessment & Plan Note (Signed)
takes Levothyroxine, TSH 0.75 02/28/21

## 2021-07-08 NOTE — Assessment & Plan Note (Signed)
Blood pressure is controlled,  takes Atenolol, Losartan, Bun/creat 20/0.9 02/28/21

## 2021-07-11 ENCOUNTER — Encounter: Payer: Self-pay | Admitting: Nurse Practitioner

## 2021-08-01 ENCOUNTER — Encounter: Payer: Self-pay | Admitting: Nurse Practitioner

## 2021-08-01 ENCOUNTER — Non-Acute Institutional Stay (SKILLED_NURSING_FACILITY): Payer: PPO | Admitting: Nurse Practitioner

## 2021-08-01 DIAGNOSIS — E559 Vitamin D deficiency, unspecified: Secondary | ICD-10-CM

## 2021-08-01 DIAGNOSIS — G309 Alzheimer's disease, unspecified: Secondary | ICD-10-CM | POA: Diagnosis not present

## 2021-08-01 DIAGNOSIS — S22000D Wedge compression fracture of unspecified thoracic vertebra, subsequent encounter for fracture with routine healing: Secondary | ICD-10-CM | POA: Diagnosis not present

## 2021-08-01 DIAGNOSIS — F0393 Unspecified dementia, unspecified severity, with mood disturbance: Secondary | ICD-10-CM

## 2021-08-01 DIAGNOSIS — I10 Essential (primary) hypertension: Secondary | ICD-10-CM | POA: Diagnosis not present

## 2021-08-01 DIAGNOSIS — E039 Hypothyroidism, unspecified: Secondary | ICD-10-CM | POA: Diagnosis not present

## 2021-08-01 DIAGNOSIS — F02C0 Dementia in other diseases classified elsewhere, severe, without behavioral disturbance, psychotic disturbance, mood disturbance, and anxiety: Secondary | ICD-10-CM

## 2021-08-01 DIAGNOSIS — K219 Gastro-esophageal reflux disease without esophagitis: Secondary | ICD-10-CM | POA: Diagnosis not present

## 2021-08-01 DIAGNOSIS — C44719 Basal cell carcinoma of skin of left lower limb, including hip: Secondary | ICD-10-CM

## 2021-08-01 NOTE — Progress Notes (Signed)
Location:   [facility] Room Number: 3A Place of Service:  SNF (31) Provider:  Roi Jafari Severiano Gilbert, MD  Patient Care Team: [name], MD as PCP - General (Internal Medicine)  Extended Emergency Contact Information Primary Emergency Contact: [facility] Address: [address]          [place], [place] [address] [place] of [place] Phone: [phone] Relation: Spouse Secondary Emergency Contact: [place], [place] Mobile Phone: [phone] Relation: Daughter Interpreter needed? No  Code Status: DNR  Goals of care: Advanced Directive information Advanced Directives 08/01/2021  Does Patient Have a Medical Advance Directive? Yes  Type of Paramedic of [place];Out of facility DNR (pink MOST or yellow form)  Does patient want to make changes to medical advance directive? No - Patient declined  Copy of [place] in Chart? Yes - validated most recent copy scanned in chart (See row information)  Pre-existing out of facility DNR order (yellow form or pink MOST form) Yellow form placed in chart (order not valid for inpatient use);Pink MOST form placed in chart (order not valid for inpatient use)     Chief Complaint  Patient presents with   Medical Management of Chronic Issues    Routine Visit   Quality Metric Gaps    Tdap, Pneumonia vaccine, COVID #5    HPI:  Pt is a 87 y.o. female seen today for medical management of chronic diseases.     Dementia, takes Donepezil, Memantine, seen neurology             Depression, mood is stabilized, takes Sertraline. Psych recommended GDR             HTN, takes Atenolol, Losartan, Bun/creat 20/0.9 02/28/21             Vit D deficiency, takes Vit D, declined DEXA             Compression fracture of body of thoracic vertera, pain is controlled on Tylenol, Lidocaine. declined DEXA             Hypothyroidism, takes Levothyroxine, TSH 0.75  02/28/21             GERD, takes Omeprazole. Hgb 12.0 02/28/21             Left foot plantar skin basal cell carcinoma, saw Dermatology, treated with 5FU, Calcipoteiene cream. Pending Mohrs Past Medical History:  Diagnosis Date   Barrett's esophagus    Dementia ([place])    Diverticulosis    Esophageal stricture    GERD (gastroesophageal reflux disease)    History of colonic polyps    History of hysterectomy    Hypertension    Hypothyroidism    Internal hemorrhoids    Kidney stone    pt denies 03/17/14   Memory difficulty 10/17/2015   Primary progressive aphasia ([place]) 01/22/2018   Past Surgical History:  Procedure Laterality Date   APPENDECTOMY     with hysterectomy   BREAST EXCISIONAL BIOPSY     right x 2 1962 and 1982   THYROIDECTOMY, PARTIAL     TONSILLECTOMY      Allergies  Allergen Reactions   Penicillins Nausea Only    Allergies as of 08/01/2021       Reactions   Penicillins Nausea Only        Medication List        Accurate as  of August 01, 2021 11:59 PM. If you have any questions, ask your nurse or doctor.          acetaminophen 325 MG tablet Commonly known as: TYLENOL Take 650 mg by mouth every 6 (six) hours as needed.   atenolol 25 MG tablet Commonly known as: TENORMIN Take 25 mg by mouth daily.   cholecalciferol 1000 units tablet Commonly known as: VITAMIN D Take 1,000 Units by mouth daily.   donepezil 5 MG tablet Commonly known as: ARICEPT Take 1 tablet (5 mg total) by mouth 2 (two) times daily.   levothyroxine 25 MCG tablet Commonly known as: SYNTHROID Take 25 mcg by mouth. Saturday and Sunday   levothyroxine 50 MCG tablet Commonly known as: SYNTHROID Take by mouth daily. 50mg  Monday thru Friday. Saturday and Sunday 25mg  (half tablet)   losartan 25 MG tablet Commonly known as: COZAAR Take 25 mg by mouth 2 (two) times daily. Morning and at 3:00pm   memantine 10 MG tablet Commonly known as: Namenda Take 1 tablet (10 mg total) by  mouth 2 (two) times daily.   omeprazole 20 MG capsule Commonly known as: PRILOSEC Take 20 mg by mouth daily.   sertraline 25 MG tablet Commonly known as: ZOLOFT Take 25 mg by mouth daily.   sodium fluoride 1.1 % Gel dental gel Commonly known as: FLUORISHIELD Place 1 application onto teeth at bedtime.        Review of Systems  Unable to perform ROS: Dementia   Immunization History  Administered Date(s) Administered   Influenza-Unspecified 04/22/2019, 06/06/2020, 06/12/2021   Moderna SARS-COV2 Booster Vaccination 01/22/2021   Moderna Sars-Covid-2 Vaccination 08/26/2019, 09/26/2019, 07/03/2020   PFIZER(Purple Top)SARS-COV-2 Vaccination 05/14/2021   Pneumococcal Conjugate-13 08/03/2013   Pneumococcal Polysaccharide-23 05/25/2006   Zoster Recombinat (Shingrix) 01/06/2018, 03/11/2018   Pertinent  Health Maintenance Due  Topic Date Due   DEXA SCAN  08/02/2022 (Originally 05/24/1996)   INFLUENZA VACCINE  Completed   Fall Risk 01/08/2017 01/22/2018 02/24/2020  Falls in the past year? No No -  Patient Fall Risk Level - - Low fall risk   Functional Status Survey:    Vitals:   08/01/21 1551  BP: 110/68  Pulse: 62  Resp: 17  Temp: 97.8 F (36.6 C)  SpO2: 96%  Weight: 99 lb 3.2 oz (45 kg)  Height: 4\' 10"  (1.473 m)   Body mass index is 20.73 kg/m. Physical Exam Vitals reviewed.  Constitutional:      Appearance: Normal appearance.  HENT:     Head: Normocephalic and atraumatic.     Mouth/Throat:     Mouth: Mucous membranes are moist.  Eyes:     Conjunctiva/sclera: Conjunctivae normal.     Pupils: Pupils are equal, round, and reactive to light.  Cardiovascular:     Rate and Rhythm: Normal rate and regular rhythm.     Heart sounds: No murmur heard. Pulmonary:     Effort: Pulmonary effort is normal.     Breath sounds: No rales.  Abdominal:     General: Bowel sounds are normal.     Palpations: Abdomen is soft.     Tenderness: There is no abdominal tenderness.   Musculoskeletal:     Cervical back: Normal range of motion and neck supple.     Right lower leg: No edema.     Left lower leg: No edema.     Comments:    Skin:    General: Skin is warm and dry.     Findings: Lesion present.  Comments: Left midfoot plantar aspect skin lesion(hx of BCC) about a quarter sized, it seems to me slightly larger, but no observed increased depth of the lesion, no s/s of infection.    Neurological:     General: No focal deficit present.     Mental Status: She is alert. Mental status is at baseline.     Comments: Oriented to person  Psychiatric:        Mood and Affect: Mood normal.    Labs reviewed: Recent Labs    08/14/20 0000 02/28/21 0000  NA 138 138  K 4.3 4.4  CL 102 104  CO2 27* 25*  BUN 18 20  CREATININE 0.9 0.9  CALCIUM 9.5 8.6*   Recent Labs    08/14/20 0000 02/28/21 0000  AST 15 13  ALT 12 10  ALKPHOS 112 75  ALBUMIN 4.1 3.8   Recent Labs    08/14/20 0000 02/28/21 0000  WBC 8.5 7.0  NEUTROABS  --  4,270.00  HGB 12.5 12.0  HCT 37 36  PLT 270 210   Lab Results  Component Value Date   TSH 0.75 02/28/2021   No results found for: HGBA1C No results found for: CHOL, HDL, LDLCALC, LDLDIRECT, TRIG, CHOLHDL  Significant Diagnostic Results in last 30 days:  No results found.  Assessment/Plan HTN (hypertension) Blood pressure is controlled, takes Atenolol, Losartan, Bun/creat 20/0.9 02/28/21  Vitamin D deficiency takes Vit D, declined DEXA  Thoracic compression fracture (HCC) Compression fracture of body of thoracic vertera, pain is controlled on Tylenol, Lidocaine. declined DEXA  Hypothyroidism takes Levothyroxine, TSH 0.75 02/28/21  GERD Stable, takes Omeprazole. Hgb 12.0 02/28/21  Basal cell carcinoma of foot, left Left foot plantar skin basal cell carcinoma, saw Dermatology, treated with 5FU, Calcipoteiene cream. Pending Mohrs  Depression due to dementia ([place])  mood is stabilized, psych recommended GDR Sertraline  12.5mg  qd x3 days, then dc.   Alzheimer's dementia ([place]) akes Donepezil, Memantine, seen neurology    Family/ staff Communication: plan of care reviewedd with the patient and charge nurse.   Labs/tests ordered:  none  Time spend 35 minutes

## 2021-08-02 ENCOUNTER — Encounter: Payer: Self-pay | Admitting: Nurse Practitioner

## 2021-08-02 DIAGNOSIS — G309 Alzheimer's disease, unspecified: Secondary | ICD-10-CM | POA: Diagnosis not present

## 2021-08-02 DIAGNOSIS — F330 Major depressive disorder, recurrent, mild: Secondary | ICD-10-CM | POA: Diagnosis not present

## 2021-08-02 NOTE — Assessment & Plan Note (Signed)
takes Levothyroxine, TSH 0.75 02/28/21

## 2021-08-02 NOTE — Assessment & Plan Note (Signed)
takes Vit D, declined DEXA

## 2021-08-02 NOTE — Assessment & Plan Note (Signed)
Stable, takes Omeprazole. Hgb 12.0 02/28/21

## 2021-08-02 NOTE — Assessment & Plan Note (Signed)
Compression fracture of body of thoracic vertera, pain is controlled on Tylenol, Lidocaine. declined DEXA

## 2021-08-02 NOTE — Assessment & Plan Note (Addendum)
mood is stabilized, psych recommended GDR Sertraline 12.5mg  qd x3 days, then dc.

## 2021-08-02 NOTE — Assessment & Plan Note (Signed)
akes Donepezil, Memantine, seen neurology

## 2021-08-02 NOTE — Assessment & Plan Note (Signed)
Left foot plantar skin basal cell carcinoma, saw Dermatology, treated with 5FU, Calcipoteiene cream. Pending Mohrs

## 2021-08-02 NOTE — Assessment & Plan Note (Signed)
Blood pressure is controlled, takes Atenolol, Losartan, Bun/creat 20/0.9 02/28/21

## 2021-08-06 DIAGNOSIS — I872 Venous insufficiency (chronic) (peripheral): Secondary | ICD-10-CM | POA: Diagnosis not present

## 2021-08-06 DIAGNOSIS — C44719 Basal cell carcinoma of skin of left lower limb, including hip: Secondary | ICD-10-CM | POA: Diagnosis not present

## 2021-08-13 DIAGNOSIS — Z4801 Encounter for change or removal of surgical wound dressing: Secondary | ICD-10-CM | POA: Diagnosis not present

## 2021-08-27 DIAGNOSIS — Z4801 Encounter for change or removal of surgical wound dressing: Secondary | ICD-10-CM | POA: Diagnosis not present

## 2021-09-09 ENCOUNTER — Encounter: Payer: Self-pay | Admitting: Nurse Practitioner

## 2021-09-09 ENCOUNTER — Non-Acute Institutional Stay (SKILLED_NURSING_FACILITY): Payer: PPO | Admitting: Nurse Practitioner

## 2021-09-09 DIAGNOSIS — G309 Alzheimer's disease, unspecified: Secondary | ICD-10-CM | POA: Diagnosis not present

## 2021-09-09 DIAGNOSIS — F0393 Unspecified dementia, unspecified severity, with mood disturbance: Secondary | ICD-10-CM | POA: Diagnosis not present

## 2021-09-09 DIAGNOSIS — R102 Pelvic and perineal pain: Secondary | ICD-10-CM | POA: Diagnosis not present

## 2021-09-09 DIAGNOSIS — S22000D Wedge compression fracture of unspecified thoracic vertebra, subsequent encounter for fracture with routine healing: Secondary | ICD-10-CM

## 2021-09-09 DIAGNOSIS — C44719 Basal cell carcinoma of skin of left lower limb, including hip: Secondary | ICD-10-CM

## 2021-09-09 DIAGNOSIS — E559 Vitamin D deficiency, unspecified: Secondary | ICD-10-CM | POA: Diagnosis not present

## 2021-09-09 DIAGNOSIS — F02C0 Dementia in other diseases classified elsewhere, severe, without behavioral disturbance, psychotic disturbance, mood disturbance, and anxiety: Secondary | ICD-10-CM

## 2021-09-09 DIAGNOSIS — E039 Hypothyroidism, unspecified: Secondary | ICD-10-CM

## 2021-09-09 DIAGNOSIS — M25551 Pain in right hip: Secondary | ICD-10-CM | POA: Diagnosis not present

## 2021-09-09 DIAGNOSIS — I10 Essential (primary) hypertension: Secondary | ICD-10-CM | POA: Diagnosis not present

## 2021-09-09 DIAGNOSIS — K219 Gastro-esophageal reflux disease without esophagitis: Secondary | ICD-10-CM

## 2021-09-09 NOTE — Progress Notes (Signed)
Location:   [facility] Room Number: 3 Place of Service:  SNF (31) Provider:  Axxel Gude X, NP  [name], MD  Patient Care Team: [name], MD as PCP - General (Internal Medicine)  Extended Emergency Contact Information Primary Emergency Contact: [facility] Address: [address]          [place], [place] [address] [place] Phone: [phone] Relation: Spouse Secondary Emergency Contact: Niala, Stcharles Mobile Phone: [phone] Relation: Daughter Interpreter needed? No  Code Status:  DNR Goals of care: Advanced Directive information Advanced Directives 09/09/2021  Does Patient Have a Medical Advance Directive? Yes  Type of Advance Directive -  Does patient want to make changes to medical advance directive? No - Patient declined  Copy of [place] in Chart? -  Pre-existing out of facility DNR order (yellow form or pink MOST form) -     Chief Complaint  Patient presents with   Acute Visit    Right hip pain    HPI:  Pt is a 88 y.o. female seen today for an acute visit for the right hip pain with ROM and weight bearing, no known fall or injury.    Dementia, takes Donepezil, Memantine, seen neurology             Depression, mood is stabilized, takes Sertraline. Psych recommended GDR             HTN, takes Atenolol, Losartan, Bun/creat 20/0.9 02/28/21             Vit D deficiency, takes Vit D, declined DEXA             Compression fracture of body of thoracic vertera, pain is controlled on Tylenol, Lidocaine. declined DEXA             Hypothyroidism, takes Levothyroxine, TSH 0.75 02/28/21             GERD, takes Omeprazole. Hgb 12.0 02/28/21             Left foot plantar skin basal cell carcinoma, saw Dermatology, s/p Mohs surgery, healing nicely.  Past Medical History:  Diagnosis Date   Barrett's esophagus    Dementia ([place])    Diverticulosis    Esophageal stricture     GERD (gastroesophageal reflux disease)    History of colonic polyps    History of hysterectomy    Hypertension    Hypothyroidism    Internal hemorrhoids    Kidney stone    pt denies 03/17/14   Memory difficulty 10/17/2015   Primary progressive aphasia ([place]) 01/22/2018   Past Surgical History:  Procedure Laterality Date   APPENDECTOMY     with hysterectomy   BREAST EXCISIONAL BIOPSY     right x 2 1962 and 1982   THYROIDECTOMY, PARTIAL     TONSILLECTOMY      Allergies  Allergen Reactions   Penicillins Nausea Only    Allergies as of 09/09/2021       Reactions   Penicillins Nausea Only        Medication List        Accurate as of September 09, 2021 11:59 PM. If you have any questions, ask your nurse or doctor.          acetaminophen 325 MG tablet Commonly known as: TYLENOL Take 650 mg by mouth every 6 (six) hours as needed.  atenolol 25 MG tablet Commonly known as: TENORMIN Take 25 mg by mouth daily.   cholecalciferol 1000 units tablet Commonly known as: VITAMIN D Take 1,000 Units by mouth daily.   donepezil 5 MG tablet Commonly known as: ARICEPT Take 1 tablet (5 mg total) by mouth 2 (two) times daily.   levothyroxine 25 MCG tablet Commonly known as: SYNTHROID Take 25 mcg by mouth. Saturday and Sunday   levothyroxine 50 MCG tablet Commonly known as: SYNTHROID Take by mouth daily. 63m Monday thru Friday. Saturday and Sunday 265m(half tablet)   losartan 25 MG tablet Commonly known as: COZAAR Take 25 mg by mouth 2 (two) times daily. Morning and at 3:00pm   memantine 10 MG tablet Commonly known as: Namenda Take 1 tablet (10 mg total) by mouth 2 (two) times daily.   omeprazole 20 MG capsule Commonly known as: PRILOSEC Take 20 mg by mouth daily.   sertraline 25 MG tablet Commonly known as: ZOLOFT Take 25 mg by mouth daily.   sodium fluoride 1.1 % Gel dental gel Commonly known as: FLUORISHIELD Place 1 application onto teeth at bedtime.         Review of Systems  Unable to perform ROS: Dementia   Immunization History  Administered Date(s) Administered   Influenza-Unspecified 04/22/2019, 06/06/2020, 06/12/2021   Moderna SARS-COV2 Booster Vaccination 01/22/2021   Moderna Sars-Covid-2 Vaccination 08/26/2019, 09/26/2019, 07/03/2020   PFIZER(Purple Top)SARS-COV-2 Vaccination 05/14/2021   Pneumococcal Conjugate-13 08/03/2013   Pneumococcal Polysaccharide-23 05/25/2006   Zoster Recombinat (Shingrix) 01/06/2018, 03/11/2018   Pertinent  Health Maintenance Due  Topic Date Due   DEXA SCAN  08/02/2022 (Originally 05/24/1996)   INFLUENZA VACCINE  Completed   Fall Risk 01/08/2017 01/22/2018 02/24/2020  Falls in the past year? No No -  Patient Fall Risk Level - - Low fall risk   Functional Status Survey:    Vitals:   09/09/21 1451  BP: 116/60  Pulse: 65  Resp: 18  Temp: 98.6 F (37 C)  SpO2: 96%  Weight: 98 lb 11.2 oz (44.8 kg)  Height: 4' 10"  (1.473 m)   Body mass index is 20.63 kg/m. Physical Exam Vitals reviewed.  Constitutional:      Appearance: Normal appearance.  HENT:     Head: Normocephalic and atraumatic.     Mouth/Throat:     Mouth: Mucous membranes are moist.  Eyes:     Conjunctiva/sclera: Conjunctivae normal.     Pupils: Pupils are equal, round, and reactive to light.  Cardiovascular:     Rate and Rhythm: Normal rate and regular rhythm.     Heart sounds: No murmur heard. Pulmonary:     Effort: Pulmonary effort is normal.     Breath sounds: No rales.  Abdominal:     General: Bowel sounds are normal.     Palpations: Abdomen is soft.     Tenderness: There is no abdominal tenderness.  Musculoskeletal:        General: Tenderness present.     Cervical back: Normal range of motion and neck supple.     Right lower leg: No edema.     Left lower leg: No edema.     Comments: Right hip pain with PROM and weight bearing, no s/s of injury or deformity.    Skin:    General: Skin is warm and dry.      Findings: Lesion present.     Comments: Left midfoot plantar aspect skin lesion(hx of BCC) is healing nicely, s/p Mohs.    Neurological:  General: No focal deficit present.     Mental Status: She is alert. Mental status is at baseline.     Comments: Oriented to person  Psychiatric:        Mood and Affect: Mood normal.    Labs reviewed: Recent Labs    02/28/21 0000  NA 138  K 4.4  CL 104  CO2 25*  BUN 20  CREATININE 0.9  CALCIUM 8.6*   Recent Labs    02/28/21 0000  AST 13  ALT 10  ALKPHOS 75  ALBUMIN 3.8   Recent Labs    02/28/21 0000  WBC 7.0  NEUTROABS 4,270.00  HGB 12.0  HCT 36  PLT 210   Lab Results  Component Value Date   TSH 0.75 02/28/2021   No results found for: HGBA1C No results found for: CHOL, HDL, LDLCALC, LDLDIRECT, TRIG, CHOLHDL  Significant Diagnostic Results in last 30 days:  No results found.  Assessment/Plan Right hip pain  the right hip pain with ROM and weight bearing, no known fall or injury. Will obtain X-ray R hip 3 views, pelvis. Tylenol 638m tid with meals x 7 days. Therapy to eval and tx if fx is ruled out.   [place]'s [place] takes Donepezil, Memantine, seen neurology  Depression due to dementia ([facility] mood is stabilized, takes Sertraline. Psych recommended GDR  HTN (hypertension) Blood pressure is controlled, takes Atenolol, Losartan, Bun/creat 20/0.9 02/28/21  Vitamin D deficiency  takes Vit D, declined DEXA  Thoracic compression fracture ([facility]) Compression fracture of body of thoracic vertera, pain is controlled on Tylenol, Lidocaine. declined DEXA  Hypothyroidism takes Levothyroxine, TSH 0.75 02/28/21  GERD  takes Omeprazole. Hgb 12.0 02/28/21  Basal cell carcinoma of foot, left Left foot plantar skin basal cell carcinoma, saw Dermatology, s/p Mohs surgery, healing nicely.     Family/ staff Communication: plan of care reviewed with the patient, the patient's husband-HPOA, and charge nurse.    Labs/tests ordered:   CBC/diff, CMP/eGFR, X-ray 3 views R hip, X-ray pelvis.   Time spend 35 minutes.

## 2021-09-09 NOTE — Assessment & Plan Note (Signed)
takes Omeprazole. Hgb 12.0 02/28/21

## 2021-09-09 NOTE — Assessment & Plan Note (Signed)
takes Levothyroxine, TSH 0.75 02/28/21

## 2021-09-09 NOTE — Assessment & Plan Note (Signed)
Blood pressure is controlled, takes Atenolol, Losartan, Bun/creat 20/0.9 02/28/21

## 2021-09-09 NOTE — Assessment & Plan Note (Signed)
Compression fracture of body of thoracic vertera, pain is controlled on Tylenol, Lidocaine. declined DEXA

## 2021-09-09 NOTE — Assessment & Plan Note (Signed)
mood is stabilized, takes Sertraline. Psych recommended GDR

## 2021-09-09 NOTE — Assessment & Plan Note (Signed)
takes Donepezil, Memantine, seen neurology 

## 2021-09-09 NOTE — Assessment & Plan Note (Signed)
the right hip pain with ROM and weight bearing, no known fall or injury. Will obtain X-ray R hip 3 views, pelvis. Tylenol 650mg  tid with meals x 7 days. Therapy to eval and tx if fx is ruled out.

## 2021-09-09 NOTE — Assessment & Plan Note (Signed)
takes Vit D, declined DEXA

## 2021-09-09 NOTE — Assessment & Plan Note (Signed)
Left foot plantar skin basal cell carcinoma, saw Dermatology, s/p Mohs surgery, healing nicely.

## 2021-09-10 ENCOUNTER — Encounter: Payer: Self-pay | Admitting: Internal Medicine

## 2021-09-10 ENCOUNTER — Non-Acute Institutional Stay (SKILLED_NURSING_FACILITY): Payer: PPO | Admitting: Internal Medicine

## 2021-09-10 ENCOUNTER — Encounter: Payer: Self-pay | Admitting: Nurse Practitioner

## 2021-09-10 DIAGNOSIS — I10 Essential (primary) hypertension: Secondary | ICD-10-CM | POA: Diagnosis not present

## 2021-09-10 DIAGNOSIS — F0393 Unspecified dementia, unspecified severity, with mood disturbance: Secondary | ICD-10-CM

## 2021-09-10 DIAGNOSIS — E039 Hypothyroidism, unspecified: Secondary | ICD-10-CM | POA: Diagnosis not present

## 2021-09-10 DIAGNOSIS — G309 Alzheimer's disease, unspecified: Secondary | ICD-10-CM | POA: Diagnosis not present

## 2021-09-10 DIAGNOSIS — F02C0 Dementia in other diseases classified elsewhere, severe, without behavioral disturbance, psychotic disturbance, mood disturbance, and anxiety: Secondary | ICD-10-CM

## 2021-09-10 DIAGNOSIS — M25551 Pain in right hip: Secondary | ICD-10-CM

## 2021-09-10 DIAGNOSIS — Z4801 Encounter for change or removal of surgical wound dressing: Secondary | ICD-10-CM | POA: Diagnosis not present

## 2021-09-10 LAB — COMPREHENSIVE METABOLIC PANEL
Albumin: 3.6 (ref 3.5–5.0)
Calcium: 8.8 (ref 8.7–10.7)
Globulin: 2.5

## 2021-09-10 LAB — CBC AND DIFFERENTIAL
HCT: 36 (ref 36–46)
Hemoglobin: 12 (ref 12.0–16.0)
Neutrophils Absolute: 5653
Platelets: 177 (ref 150–399)
WBC: 8.4

## 2021-09-10 LAB — CBC: RBC: 3.97 (ref 3.87–5.11)

## 2021-09-10 LAB — HEPATIC FUNCTION PANEL
ALT: 9 (ref 7–35)
AST: 10 — AB (ref 13–35)
Alkaline Phosphatase: 74 (ref 25–125)
Bilirubin, Total: 0.5

## 2021-09-10 LAB — BASIC METABOLIC PANEL
BUN: 30 — AB (ref 4–21)
CO2: 27 — AB (ref 13–22)
Chloride: 106 (ref 99–108)
Creatinine: 1.1 (ref 0.5–1.1)
Glucose: 99
Potassium: 4 (ref 3.4–5.3)
Sodium: 139 (ref 137–147)

## 2021-09-10 NOTE — Progress Notes (Signed)
Location: [facility] manager of Service:  SNF (31)  Provider:   Code Status: DNR Goals of Care:  Advanced Directives 09/09/2021  Does Patient Have a Medical Advance Directive? Yes  Type of Advance Directive -  Does patient want to make changes to medical advance directive? No - Patient declined  Copy of [place] in Chart? -  Pre-existing out of facility DNR order (yellow form or pink MOST form) -     Chief Complaint  Patient presents with   Acute Visit    HPI: Patient is a 88 y.o. female seen today for an acute visit for Right hip pain and Right pelvic pain Not ambulating  Has history of hypertension, hypothyroidism, and GERD, left plantar basal cell cancer, Alzheimer's dementia with aphasia  T 5 Compression Fracture and L2 Compression Osteoporosis. Refused Therapy with [facility] Depression  Patient c/o Pain in her Right hip since yesterday No Falls or Injuries Hard to get much history due to her aphasia Xray done is negative for any Acute fracture But she continues to c/o Pain.when they are trying to give her care She is also refusing to walk. Usually she walks with no assist  Past Medical History:  Diagnosis Date   Barrett's esophagus    Dementia ([place])    Diverticulosis    Esophageal stricture    GERD (gastroesophageal reflux disease)    History of colonic polyps    History of hysterectomy    Hypertension    Hypothyroidism    Internal hemorrhoids    Kidney stone    pt denies 03/17/14   Memory difficulty 10/17/2015   Primary progressive aphasia ([place]) 01/22/2018    Past Surgical History:  Procedure Laterality Date   APPENDECTOMY     with hysterectomy   BREAST EXCISIONAL BIOPSY     right x 2 1962 and 1982   THYROIDECTOMY, PARTIAL     TONSILLECTOMY      Allergies  Allergen Reactions   Penicillins Nausea Only    Outpatient Encounter Medications as of 09/10/2021  Medication Sig   acetaminophen (TYLENOL) 325 MG tablet Take  650 mg by mouth every 6 (six) hours as needed.   atenolol (TENORMIN) 25 MG tablet Take 25 mg by mouth daily.    cholecalciferol (VITAMIN D) 1000 UNITS tablet Take 1,000 Units by mouth daily.    donepezil (ARICEPT) 5 MG tablet Take 1 tablet (5 mg total) by mouth 2 (two) times daily.   levothyroxine (SYNTHROID) 25 MCG tablet Take 25 mcg by mouth. Saturday and Sunday   levothyroxine (SYNTHROID, LEVOTHROID) 50 MCG tablet Take by mouth daily. 50mg  Monday thru Friday. Saturday and Sunday 25mg  (half tablet)   losartan (COZAAR) 25 MG tablet Take 25 mg by mouth 2 (two) times daily. Morning and at 3:00pm   memantine (NAMENDA) 10 MG tablet Take 1 tablet (10 mg total) by mouth 2 (two) times daily.   omeprazole (PRILOSEC) 20 MG capsule Take 20 mg by mouth daily.   sertraline (ZOLOFT) 25 MG tablet Take 25 mg by mouth daily.   sodium fluoride (FLUORISHIELD) 1.1 % GEL dental gel Place 1 application onto teeth at bedtime.   No facility-administered encounter medications on file as of 09/10/2021.    Review of Systems:  Review of Systems  Constitutional:  Positive for activity change. Negative for appetite change.  HENT: Negative.    Respiratory:  Negative for cough and shortness of breath.   Cardiovascular:  Negative for leg swelling.  Gastrointestinal:  Negative for constipation.  Genitourinary:  Positive for pelvic pain.  Musculoskeletal:  Positive for gait problem. Negative for arthralgias and myalgias.  Skin: Negative.   Neurological:  Negative for dizziness and weakness.  Psychiatric/Behavioral:  Negative for dysphoric mood and sleep disturbance.    Health Maintenance  Topic Date Due   TETANUS/TDAP  Never done   COVID-19 Vaccine (5 - Booster for Moderna series) 07/09/2021   DEXA SCAN  08/02/2022 (Originally [birth year]/1996)   Pneumonia Vaccine 45+ Years old  Completed   INFLUENZA VACCINE  Completed   Zoster Vaccines- Shingrix  Completed   HPV VACCINES  Aged Out    Physical Exam: Vitals:    09/10/21 1530  BP: 116/60  Pulse: 65  Resp: 18  Temp: 98.1 F (36.7 C)   There is no height or weight on file to calculate BMI. Physical Exam Vitals reviewed.  Constitutional:      Appearance: Normal appearance.     Comments: Very frail  HENT:     Head: Normocephalic.     Nose: Nose normal.     Mouth/Throat:     Mouth: Mucous membranes are moist.     Pharynx: Oropharynx is clear.  Eyes:     Pupils: Pupils are equal, round, and reactive to light.  Cardiovascular:     Rate and Rhythm: Normal rate and regular rhythm.     Pulses: Normal pulses.     Heart sounds: Normal heart sounds. No murmur heard. Pulmonary:     Effort: Pulmonary effort is normal.     Breath sounds: Normal breath sounds.  Abdominal:     General: Abdomen is flat. Bowel sounds are normal.     Palpations: Abdomen is soft.  Musculoskeletal:        General: No swelling.     Cervical back: Neck supple.     Comments: Left Leg EXAm was normal Right Hip she would let me move it in lateral position but not let me Flex it Also tries to hold my hand and Grunts when I hold her Right leg Also c/o Severe pain in Anterior Pelvic area  Skin:    General: Skin is warm.  Neurological:     General: No focal deficit present.     Mental Status: She is alert.     Comments: Has aphasia  Psychiatric:        Mood and Affect: Mood normal.        Thought Content: Thought content normal.    Labs reviewed: Basic Metabolic Panel: Recent Labs    02/28/21 0000  NA 138  K 4.4  CL 104  CO2 25*  BUN 20  CREATININE 0.9  CALCIUM 8.6*  TSH 0.75   Liver Function Tests: Recent Labs    02/28/21 0000  AST 13  ALT 10  ALKPHOS 75  ALBUMIN 3.8   No results for input(s): LIPASE, AMYLASE in the last 8760 hours. No results for input(s): AMMONIA in the last 8760 hours. CBC: Recent Labs    02/28/21 0000  WBC 7.0  NEUTROABS 4,270.00  HGB 12.0  HCT 36  PLT 210   Lipid Panel: No results for input(s): CHOL, HDL, LDLCALC,  TRIG, CHOLHDL, LDLDIRECT in the last 8760 hours. No results found for: HGBA1C  Procedures since last visit: No results found.  Assessment/Plan 1. Right hip pain Xray Negative Will order CT Scan  2. Pelvic joint pain, right CT scan ordered  3. Severe Alzheimer's dementia without behavioral disturbance, psychotic disturbance, mood  disturbance, or anxiety, unspecified timing of dementia onset ([place]) On Aricept and Namenda  4. Depression due to dementia Doing well on Zoloft Hypertension, unspecified type On Lopressor and Cozaar    Labs/tests ordered:  * No order type specified * Next appt:  Visit date not found

## 2021-09-12 ENCOUNTER — Other Ambulatory Visit: Payer: Self-pay

## 2021-09-12 ENCOUNTER — Other Ambulatory Visit: Payer: Self-pay | Admitting: Internal Medicine

## 2021-09-12 ENCOUNTER — Ambulatory Visit
Admission: RE | Admit: 2021-09-12 | Discharge: 2021-09-12 | Disposition: A | Discharge status: Home / Self Care | Payer: PPO | Source: Ambulatory Visit | Attending: Internal Medicine | Admitting: Internal Medicine

## 2021-09-12 DIAGNOSIS — G309 Alzheimer's disease, unspecified: Secondary | ICD-10-CM

## 2021-09-12 DIAGNOSIS — F02C0 Dementia in other diseases classified elsewhere, severe, without behavioral disturbance, psychotic disturbance, mood disturbance, and anxiety: Secondary | ICD-10-CM

## 2021-09-12 DIAGNOSIS — M25551 Pain in right hip: Secondary | ICD-10-CM

## 2021-09-13 ENCOUNTER — Non-Acute Institutional Stay (SKILLED_NURSING_FACILITY): Payer: PPO | Admitting: Nurse Practitioner

## 2021-09-13 ENCOUNTER — Ambulatory Visit
Admission: RE | Admit: 2021-09-13 | Discharge: 2021-09-13 | Disposition: A | Discharge status: Home / Self Care | Payer: PPO | Source: Ambulatory Visit | Attending: Internal Medicine | Admitting: Internal Medicine

## 2021-09-13 ENCOUNTER — Encounter: Payer: Self-pay | Admitting: Nurse Practitioner

## 2021-09-13 DIAGNOSIS — S32591D Other specified fracture of right pubis, subsequent encounter for fracture with routine healing: Secondary | ICD-10-CM

## 2021-09-13 DIAGNOSIS — G309 Alzheimer's disease, unspecified: Secondary | ICD-10-CM

## 2021-09-13 DIAGNOSIS — Z90710 Acquired absence of both cervix and uterus: Secondary | ICD-10-CM | POA: Diagnosis not present

## 2021-09-13 DIAGNOSIS — K5730 Diverticulosis of large intestine without perforation or abscess without bleeding: Secondary | ICD-10-CM | POA: Diagnosis not present

## 2021-09-13 DIAGNOSIS — C44719 Basal cell carcinoma of skin of left lower limb, including hip: Secondary | ICD-10-CM | POA: Diagnosis not present

## 2021-09-13 DIAGNOSIS — S22000D Wedge compression fracture of unspecified thoracic vertebra, subsequent encounter for fracture with routine healing: Secondary | ICD-10-CM | POA: Diagnosis not present

## 2021-09-13 DIAGNOSIS — F02C0 Dementia in other diseases classified elsewhere, severe, without behavioral disturbance, psychotic disturbance, mood disturbance, and anxiety: Secondary | ICD-10-CM

## 2021-09-13 DIAGNOSIS — I10 Essential (primary) hypertension: Secondary | ICD-10-CM | POA: Diagnosis not present

## 2021-09-13 DIAGNOSIS — E559 Vitamin D deficiency, unspecified: Secondary | ICD-10-CM | POA: Diagnosis not present

## 2021-09-13 DIAGNOSIS — F0393 Unspecified dementia, unspecified severity, with mood disturbance: Secondary | ICD-10-CM | POA: Diagnosis not present

## 2021-09-13 DIAGNOSIS — E039 Hypothyroidism, unspecified: Secondary | ICD-10-CM

## 2021-09-13 DIAGNOSIS — K219 Gastro-esophageal reflux disease without esophagitis: Secondary | ICD-10-CM

## 2021-09-13 DIAGNOSIS — M25551 Pain in right hip: Secondary | ICD-10-CM

## 2021-09-13 DIAGNOSIS — S32511A Fracture of superior rim of right pubis, initial encounter for closed fracture: Secondary | ICD-10-CM | POA: Diagnosis not present

## 2021-09-13 NOTE — Assessment & Plan Note (Signed)
mood is stabilized, takes Sertraline. Psych recommended GDR

## 2021-09-13 NOTE — Assessment & Plan Note (Signed)
Blood pressure is controlled, continue  Atenolol, Losartan, Bun/creat 20/0.9 02/28/21

## 2021-09-13 NOTE — Assessment & Plan Note (Signed)
Stable, takes Omeprazole. Hgb 12.0 02/28/21

## 2021-09-13 NOTE — Assessment & Plan Note (Signed)
Left foot plantar skin basal cell carcinoma, saw Dermatology, s/p Mohs surgery, healing nicely.

## 2021-09-13 NOTE — Assessment & Plan Note (Signed)
The right hip pain with ROM and weight bearing, no known fall or injury. Xray was negtaive for fx. CT . Acute nondisplaced fracture of the right inferior pubic ramus and suspected subtle acute nondisplaced fracture in the right sacrum. Will try Norco 5/325mg  1/2 tab bid for pain, PT to eval and tx.

## 2021-09-13 NOTE — Assessment & Plan Note (Signed)
takes Vit D, declined DEXA

## 2021-09-13 NOTE — Assessment & Plan Note (Signed)
Compression fracture of body of thoracic vertera, pain is controlled on Tylenol, Lidocaine. declined DEXA

## 2021-09-13 NOTE — Assessment & Plan Note (Signed)
Advanced, expressive aphasia, anticipating her needs via body gestures and facial expressions.

## 2021-09-13 NOTE — Assessment & Plan Note (Signed)
takes Levothyroxine, TSH 0.75 02/28/21

## 2021-09-13 NOTE — Progress Notes (Signed)
Location:   SNF [place] Room Number: 3 Place of Service:  SNF (31) Provider: [facility] Amiel Sharrow NP  [name], MD  Patient Care Team: [name], MD as PCP - General (Internal Medicine)  Extended Emergency Contact Information Primary Emergency Contact: [facility] Address: [address]          [place], [place] [address] [place] Phone: [phone] Relation: Spouse Secondary Emergency Contact: Avianah, [place] Mobile Phone: [phone] Relation: Daughter Interpreter needed? No  Code Status: DNR Goals of care: Advanced Directive information Advanced Directives 09/09/2021  Does Patient Have a Medical Advance Directive? Yes  Type of Advance Directive -  Does patient want to make changes to medical advance directive? No - Patient declined  Copy of [place] in Chart? -  Pre-existing out of facility DNR order (yellow form or pink MOST form) -     Chief Complaint  Patient presents with   Acute Visit    Right pelvis fx    HPI:  Pt is a 88 y.o. female seen today for an acute visit for pelvis fx   The right hip pain with ROM and weight bearing, no known fall or injury. Xray was negtaive for fx. CT . Acute nondisplaced fracture of the right inferior pubic ramus and suspected subtle acute nondisplaced fracture in the right sacrum.    Dementia, takes Donepezil, Memantine, seen neurology             Depression, mood is stabilized, takes Sertraline. Psych recommended GDR             HTN, takes Atenolol, Losartan, Bun/creat 20/0.9 02/28/21             Vit D deficiency, takes Vit D, declined DEXA             Compression fracture of body of thoracic vertera, pain is controlled on Tylenol, Lidocaine. declined DEXA             Hypothyroidism, takes Levothyroxine, TSH 0.75 02/28/21             GERD, takes Omeprazole. Hgb 12.0 02/28/21             Left foot plantar skin basal cell carcinoma, saw Dermatology, s/p Mohs  surgery, healing nicely.   Past Medical History:  Diagnosis Date   Barrett's esophagus    Dementia ([place])    Diverticulosis    Esophageal stricture    GERD (gastroesophageal reflux disease)    History of colonic polyps    History of hysterectomy    Hypertension    Hypothyroidism    Internal hemorrhoids    Kidney stone    pt denies 03/17/14   Memory difficulty 10/17/2015   Primary progressive aphasia ([place]) 01/22/2018   Past Surgical History:  Procedure Laterality Date   APPENDECTOMY     with hysterectomy   BREAST EXCISIONAL BIOPSY     right x 2 1962 and 1982   THYROIDECTOMY, PARTIAL     TONSILLECTOMY      Allergies  Allergen Reactions   Penicillins Nausea Only    Allergies as of 09/13/2021       Reactions   Penicillins Nausea Only        Medication List        Accurate as of September 13, 2021 11:59 PM. If you have any questions, ask your nurse or doctor.  acetaminophen 325 MG tablet Commonly known as: TYLENOL Take 650 mg by mouth every 6 (six) hours as needed.   atenolol 25 MG tablet Commonly known as: TENORMIN Take 25 mg by mouth daily.   cholecalciferol 1000 units tablet Commonly known as: VITAMIN D Take 1,000 Units by mouth daily.   donepezil 5 MG tablet Commonly known as: ARICEPT Take 1 tablet (5 mg total) by mouth 2 (two) times daily.   levothyroxine 25 MCG tablet Commonly known as: SYNTHROID Take 25 mcg by mouth. Saturday and Sunday   levothyroxine 50 MCG tablet Commonly known as: SYNTHROID Take by mouth daily. 50mg  Monday thru Friday. Saturday and Sunday 25mg  (half tablet)   losartan 25 MG tablet Commonly known as: COZAAR Take 25 mg by mouth 2 (two) times daily. Morning and at 3:00pm   memantine 10 MG tablet Commonly known as: Namenda Take 1 tablet (10 mg total) by mouth 2 (two) times daily.   omeprazole 20 MG capsule Commonly known as: PRILOSEC Take 20 mg by mouth daily.   sertraline 25 MG tablet Commonly known as:  ZOLOFT Take 25 mg by mouth daily.   sodium fluoride 1.1 % Gel dental gel Commonly known as: FLUORISHIELD Place 1 application onto teeth at bedtime.        Review of Systems  Unable to perform ROS: Dementia   Immunization History  Administered Date(s) Administered   Influenza-Unspecified 04/22/2019, 06/06/2020, 06/12/2021   Moderna SARS-COV2 Booster Vaccination 01/22/2021   Moderna Sars-Covid-2 Vaccination 08/26/2019, 09/26/2019, 07/03/2020   PFIZER(Purple Top)SARS-COV-2 Vaccination 05/14/2021   Pneumococcal Conjugate-13 08/03/2013   Pneumococcal Polysaccharide-23 05/25/2006   Zoster Recombinat (Shingrix) 01/06/2018, 03/11/2018   Pertinent  Health Maintenance Due  Topic Date Due   DEXA SCAN  08/02/2022 (Originally 05/24/1996)   INFLUENZA VACCINE  Completed   Fall Risk 01/08/2017 01/22/2018 02/24/2020  Falls in the past year? No No -  Patient Fall Risk Level - - Low fall risk   Functional Status Survey:    Vitals:   09/13/21 1013  BP: 116/60  Pulse: 65  Resp: 20  Temp: (!) 96.9 F (36.1 C)  SpO2: 96%   There is no height or weight on file to calculate BMI. Physical Exam Vitals reviewed.  Constitutional:      Appearance: Normal appearance.  HENT:     Head: Normocephalic and atraumatic.     Mouth/Throat:     Mouth: Mucous membranes are moist.  Eyes:     Conjunctiva/sclera: Conjunctivae normal.     Pupils: Pupils are equal, round, and reactive to light.  Cardiovascular:     Rate and Rhythm: Normal rate and regular rhythm.     Heart sounds: No murmur heard. Pulmonary:     Effort: Pulmonary effort is normal.     Breath sounds: No rales.  Abdominal:     General: Bowel sounds are normal.     Palpations: Abdomen is soft.     Tenderness: There is no abdominal tenderness.  Musculoskeletal:        General: Tenderness present.     Cervical back: Normal range of motion and neck supple.     Right lower leg: No edema.     Left lower leg: No edema.     Comments:  Grimace and guarded when PROM of the right leg and palpated the right pelvis.    Skin:    General: Skin is warm and dry.     Findings: Lesion present.     Comments: Left midfoot plantar aspect  skin lesion(hx of BCC) is healing nicely, s/p Mohs.    Neurological:     General: No focal deficit present.     Mental Status: She is alert. Mental status is at baseline.     Comments: Oriented to person  Psychiatric:        Mood and Affect: Mood normal.    Labs reviewed: Recent Labs    02/28/21 0000  NA 138  K 4.4  CL 104  CO2 25*  BUN 20  CREATININE 0.9  CALCIUM 8.6*   Recent Labs    02/28/21 0000  AST 13  ALT 10  ALKPHOS 75  ALBUMIN 3.8   Recent Labs    02/28/21 0000  WBC 7.0  NEUTROABS 4,270.00  HGB 12.0  HCT 36  PLT 210   Lab Results  Component Value Date   TSH 0.75 02/28/2021   No results found for: HGBA1C No results found for: CHOL, HDL, LDLCALC, LDLDIRECT, TRIG, CHOLHDL  Significant Diagnostic Results in last 30 days:  CT PELVIS WO CONTRAST  Result Date: 09/13/2021 CLINICAL DATA:  Hip pain, stress fracture suspected EXAM: CT PELVIS WITHOUT CONTRAST TECHNIQUE: Multidetector CT imaging of the pelvis was performed following the standard protocol without intravenous contrast. RADIATION DOSE REDUCTION: This exam was performed according to the departmental dose-optimization program which includes automated exposure control, adjustment of the mA and/or kV according to patient size and/or use of iterative reconstruction technique. COMPARISON:  None. FINDINGS: Urinary Tract:  Urinary bladder appears within normal limits. Bowel: No evidence of bowel obstruction. Colonic diverticulosis. Retained fecal material in the colon and rectum. Vascular/Lymphatic: Atherosclerotic disease. No bulky lymphadenopathy visualized. Reproductive:  Hysterectomy changes. Other:  None. Musculoskeletal: Acute nondisplaced fracture of the right inferior pubic ramus and very subtle suspected acute  nondisplaced fracture in the right sacrum centered at the level of S2. Bones are osteopenic. No suspicious bony lesions identified. IMPRESSION: 1. Acute nondisplaced fracture of the right inferior pubic ramus and suspected subtle acute nondisplaced fracture in the right sacrum. 2. Colonic diverticulosis. Electronically Signed   By: Ofilia Neas M.D.   On: 09/13/2021 08:55   CT HIP RIGHT WO CONTRAST  Result Date: 09/13/2021 CLINICAL DATA:  Hip pain, stress fracture suspected.aa EXAM: CT OF THE RIGHT HIP WITHOUT CONTRAST TECHNIQUE: Multidetector CT imaging of the right hip was performed according to the standard protocol. Multiplanar CT image reconstructions were also generated. RADIATION DOSE REDUCTION: This exam was performed according to the departmental dose-optimization program which includes automated exposure control, adjustment of the mA and/or kV according to patient size and/or use of iterative reconstruction technique. COMPARISON:  None. FINDINGS: Bones/Joint/Cartilage There is mildly displaced fracture of the inferior pubic ramus (series 3 image 63-69). No other appreciable fracture. No significant hip arthropathy. Degenerate disc disease of the lower lumbar spine prominent at L5-S1 with facet joint arthropathy. Sacroiliac joint and pubic symphysis are intact. Ligaments Suboptimally assessed by CT. Muscles and Tendons No acute abnormality. No hematoma or fluid collection. No muscle atrophy. Soft tissues Skin and subcutaneous soft tissues are within normal limits. IMPRESSION: Mildly displaced fracture of the right inferior pubic ramus. Electronically Signed   By: Keane Police D.O.   On: 09/13/2021 08:50    Assessment/Plan: Closed fracture of single pubic ramus of pelvis with routine healing, right The right hip pain with ROM and weight bearing, no known fall or injury. Xray was negtaive for fx. CT . Acute nondisplaced fracture of the right inferior pubic ramus and suspected subtle acute  nondisplaced fracture in the right sacrum. Will try Norco 5/325mg  1/2 tab bid for pain, PT to eval and tx.   Alzheimer's dementia ([place]) Advanced, expressive aphasia, anticipating her needs via body gestures and facial expressions.   Depression due to dementia [facility]) mood is stabilized, takes Sertraline. Psych recommended GDR  HTN (hypertension) Blood pressure is controlled, continue  Atenolol, Losartan, Bun/creat 20/0.9 02/28/21  Vitamin D deficiency  takes Vit D, declined DEXA  Thoracic compression fracture ([facility]) Compression fracture of body of thoracic vertera, pain is controlled on Tylenol, Lidocaine. declined DEXA  Hypothyroidism takes Levothyroxine, TSH 0.75 02/28/21  GERD Stable, takes Omeprazole. Hgb 12.0 02/28/21  Basal cell carcinoma of foot, left Left foot plantar skin basal cell carcinoma, saw Dermatology, s/p Mohs surgery, healing nicely.     Family/ staff Communication: plan of care reviewed with the patient and charge nurse.   Labs/tests ordered:  none  Time spend 35 minutes.

## 2021-09-17 ENCOUNTER — Non-Acute Institutional Stay (INDEPENDENT_AMBULATORY_CARE_PROVIDER_SITE_OTHER): Payer: PPO | Admitting: Nurse Practitioner

## 2021-09-17 ENCOUNTER — Encounter: Payer: Self-pay | Admitting: Nurse Practitioner

## 2021-09-17 DIAGNOSIS — Z0000 Encounter for general adult medical examination without abnormal findings: Secondary | ICD-10-CM | POA: Diagnosis not present

## 2021-09-18 DIAGNOSIS — M6281 Muscle weakness (generalized): Secondary | ICD-10-CM | POA: Diagnosis not present

## 2021-09-18 DIAGNOSIS — R2689 Other abnormalities of gait and mobility: Secondary | ICD-10-CM | POA: Diagnosis not present

## 2021-09-18 DIAGNOSIS — S32501A Unspecified fracture of right pubis, initial encounter for closed fracture: Secondary | ICD-10-CM | POA: Diagnosis not present

## 2021-09-18 DIAGNOSIS — M25551 Pain in right hip: Secondary | ICD-10-CM | POA: Diagnosis not present

## 2021-09-24 ENCOUNTER — Encounter: Payer: Self-pay | Admitting: Nurse Practitioner

## 2021-09-24 DIAGNOSIS — S91302A Unspecified open wound, left foot, initial encounter: Secondary | ICD-10-CM | POA: Diagnosis not present

## 2021-09-24 NOTE — Progress Notes (Addendum)
Subjective:   Christina Camacho is a 88 y.o. female who presents for Medicare Annual (Subsequent) preventive examination at [facility].     Objective:    Today's Vitals   09/17/21 1614 09/24/21 1511  BP: 126/70   Pulse: 60   Resp: 18   Temp: (!) 97.5 F (36.4 C)   SpO2: 96%   Weight: 98 lb 11.2 oz (44.8 kg)   Height: 4\' 11"  (1.499 m)   PainSc:  5    Body mass index is 19.93 kg/m.  Advanced Directives 09/17/2021 09/09/2021 08/01/2021 07/08/2021 06/11/2021 05/07/2021 04/22/2021  Does Patient Have a Medical Advance Directive? Yes Yes Yes Yes Yes Yes Yes  Type of Paramedic of [place];Living will;Out of facility DNR (pink MOST or yellow form) - [place];Out of facility DNR (pink MOST or yellow form) [place];Out of facility DNR (pink MOST or yellow form) [place];Out of facility DNR (pink MOST or yellow form) [place];Out of facility DNR (pink MOST or yellow form) [place];Out of facility DNR (pink MOST or yellow form)  Does patient want to make changes to medical advance directive? No - Patient declined No - Patient declined No - Patient declined No - Patient declined No - Patient declined No - Patient declined No - Patient declined  Copy of [place] in Chart? Yes - validated most recent copy scanned in chart (See row information) - Yes - validated most recent copy scanned in chart (See row information) Yes - validated most recent copy scanned in chart (See row information) Yes - validated most recent copy scanned in chart (See row information) Yes - validated most recent copy scanned in chart (See row information) Yes - validated most recent copy scanned in chart (See row information)  Pre-existing out of facility DNR order (yellow form or pink MOST form) Pink MOST form placed in chart (order not valid for inpatient  use) - Yellow form placed in chart (order not valid for inpatient use);Pink MOST form placed in chart (order not valid for inpatient use) Yellow form placed in chart (order not valid for inpatient use);Pink MOST form placed in chart (order not valid for inpatient use) Yellow form placed in chart (order not valid for inpatient use);Pink MOST form placed in chart (order not valid for inpatient use) Yellow form placed in chart (order not valid for inpatient use);Pink MOST form placed in chart (order not valid for inpatient use) Yellow form placed in chart (order not valid for inpatient use);Pink MOST form placed in chart (order not valid for inpatient use)    Current Medications (verified) Outpatient Encounter Medications as of 09/17/2021  Medication Sig   atenolol (TENORMIN) 25 MG tablet Take 25 mg by mouth daily.    cholecalciferol (VITAMIN D) 1000 UNITS tablet Take 1,000 Units by mouth daily.    donepezil (ARICEPT) 5 MG tablet Take 1 tablet (5 mg total) by mouth 2 (two) times daily.   HYDROcodone-acetaminophen (NORCO/VICODIN) 5-325 MG tablet Take 0.5 tablets by mouth 2 (two) times daily.   levothyroxine (SYNTHROID) 25 MCG tablet Take 25 mcg by mouth. Saturday and Sunday   levothyroxine (SYNTHROID, LEVOTHROID) 50 MCG tablet Take by mouth daily. 50mg  Monday thru Friday. Saturday and Sunday 25mg  (half tablet)   losartan (COZAAR) 25 MG tablet Take 25 mg by mouth 2 (two) times daily. Morning and at 3:00pm   memantine (NAMENDA) 10 MG  tablet Take 1 tablet (10 mg total) by mouth 2 (two) times daily.   omeprazole (PRILOSEC) 20 MG capsule Take 20 mg by mouth daily.   sertraline (ZOLOFT) 25 MG tablet Take 25 mg by mouth daily.   Sodium Fluoride (PREVIDENT 5000 BOOSTER PLUS) 1.1 % PSTE Place 1 application onto teeth at bedtime.   UNABLE TO FIND Med Name: Magic Cup daily at 12 pm   [DISCONTINUED] acetaminophen (TYLENOL) 325 MG tablet Take 650 mg by mouth every 6 (six) hours as needed.   [DISCONTINUED] sodium  fluoride (FLUORISHIELD) 1.1 % GEL dental gel Place 1 application onto teeth at bedtime.   No facility-administered encounter medications on file as of 09/17/2021.    Allergies (verified) Penicillins   History: Past Medical History:  Diagnosis Date   Barrett's esophagus    Dementia ([place])    Diverticulosis    Esophageal stricture    GERD (gastroesophageal reflux disease)    History of colonic polyps    History of hysterectomy    Hypertension    Hypothyroidism    Internal hemorrhoids    Kidney stone    pt denies 03/17/14   Memory difficulty 10/17/2015   Primary progressive aphasia ([place]) 01/22/2018   Past Surgical History:  Procedure Laterality Date   APPENDECTOMY     with hysterectomy   BREAST EXCISIONAL BIOPSY     right x 2 1962 and [place], PARTIAL     TONSILLECTOMY     Family History  Problem Relation Age of Onset   Stroke Brother    Emphysema Father    Heart attack Mother    Dementia Mother    Dementia Sister    Dementia Sister    Cancer Sister    Colon cancer Neg Hx    Social History   Socioeconomic History   Marital status: Married    Spouse name: Vern   Number of children: 1   Years of education: masters   Highest education level: Not on file  Occupational History   Occupation: retired  Tobacco Use   Smoking status: Former    Packs/day: 1.00    Years: 38.00    Pack years: 38.00    Types: Cigarettes    Start date: 08/25/1948    Quit date: 08/10/1987    Years since quitting: 34.1   Smokeless tobacco: Never  Vaping Use   Vaping Use: Never used  Substance and Sexual Activity   Alcohol use: Yes    Alcohol/week: 7.0 standard drinks    Types: 7 Glasses of wine per week   Drug use: No   Sexual activity: Not Currently  Other Topics Concern   Not on file  Social History Narrative   Married   Patient drinks about 3 cups of caffeine daily.   Patient is left handed but can use her right hand.         Diet:       Do you drink/ eat  things with caffeine? Coffee      Marital status:  Married                             What year were you married ?  1959-1982+1988-present      Do you live in a house, apartment,assistred living, condo, trailer, etc.)? Apartment 1st floor      Is it one or more stories? 4 stories      How many persons live in  your home ? 2      Do you have any pets in your home ?(please list)   No      Highest Level of education completed:  Masters      Current or past profession: [employer]       Do you exercise?    Yes                          Type & how often b Walk every day weather permitting       ADVANCED DIRECTIVES (Please bring copies)      Do you have a living will? YES      Do you have a DNR form?  YES                     If not, do you want to discuss one?       Do you have signed POA?HPOA forms?  YES               If so, please bring to your appointment      FUNCTIONAL STATUS- To be completed by Spouse / child / Staff       Do you have difficulty bathing or dressing yourself ?   No      Do you have difficulty preparing food or eating ?  Yes      Do you have difficulty managing your mediation ?  Yes      Do you have difficulty managing your finances ?  Yes      Do you have difficulty affording your medication ?  No      Social Determinants of Radio broadcast assistant Strain: Not on file  Food Insecurity: Not on file  Transportation Needs: Not on file  Physical Activity: Not on file  Stress: Not on file  Social Connections: Not on file    Tobacco Counseling Counseling given: Not Answered   Clinical Intake:  Pre-visit preparation completed: Yes  Pain : 0-10 Pain Score: 5  Pain Type: Acute pain Pain Location: Pelvis Pain Orientation: Right Pain Descriptors / Indicators: Aching, Constant, Discomfort, Dull, Guarding, Grimacing, Moaning, Nagging Pain Onset: 1 to 4 weeks ago Pain Frequency: Several days a week Pain Relieving Factors:  imobilizing, resting, medications Effect of Pain on Daily Activities: limited ambulation  Pain Relieving Factors: imobilizing, resting, medications  BMI - recorded: 19.93 Nutritional Status: BMI of 19-24  Normal Nutritional Risks: None  How often do you need to have someone help you when you read instructions, pamphlets, or other written materials from your doctor or pharmacy?: 5 - Always What is the last grade level you completed in school?: college  Diabetic?no  Interpreter Needed?: No  Information entered by :: Jamariah Tony Bretta Bang NP   Activities of Daily Living No flowsheet data found.  Patient Care Team: [name], MD as PCP - General (Internal Medicine)  Indicate any recent Medical Services you may have received from other than [facility] providers in the past year (date may be approximate).     Assessment:   This is a routine wellness examination for Christina Camacho.  Hearing/Vision screen No results found.  Dietary issues and exercise activities discussed:     Goals Addressed   None    Depression Screen No flowsheet data found.  Fall Risk Fall Risk  01/22/2018 01/08/2017  Falls in the past year? No No    FALL  RISK PREVENTION PERTAINING TO THE HOME:  Any stairs in or around the home? Yes  If so, are there any without handrails? No  Home free of loose throw rugs in walkways, pet beds, electrical cords, etc? Yes  Adequate lighting in your home to reduce risk of falls? Yes   ASSISTIVE DEVICES UTILIZED TO PREVENT FALLS:  Life alert? No  Use of a cane, walker or w/c? Yes  Grab bars in the bathroom? Yes  Shower chair or bench in shower? Yes  Elevated toilet seat or a handicapped toilet? Yes   TIMED UP AND GO:  Was the test performed? No . Needs assistance for transfer, w/c for mobility.   Cognitive Function: MMSE - Mini Mental State Exam 06/06/2019 07/30/2018 01/22/2018 07/22/2017 01/08/2017  Orientation to time 1 0 5 4 5   Orientation to Place 0 4 2 4 3   Registration  3 3 3 3 3   Attention/ Calculation 1 2 1 2 1   Recall 0 0 1 2 1   Language- name 2 objects 1 2 1 2 2   Language- repeat 0 0 0 0 1  Language- follow 3 step command 3 3 3 3 3   Language- read & follow direction 1 1 1 1 1   Write a sentence 0 1 1 1 1   Copy design 0 0 0 0 0  Total score 10 16 18 22 21         Immunizations Immunization History  Administered Date(s) Administered   Influenza-Unspecified 04/22/2019, 06/06/2020, 06/12/2021   Moderna SARS-COV2 Booster Vaccination 01/22/2021   Moderna Sars-Covid-2 Vaccination 08/26/2019, 09/26/2019, 07/03/2020   PFIZER(Purple Top)SARS-COV-2 Vaccination 05/14/2021   Pneumococcal Conjugate-13 08/03/2013   Pneumococcal Polysaccharide-23 05/25/2006   Zoster Recombinat (Shingrix) 01/06/2018, 03/11/2018    TDAP status: Due, Education has been provided regarding the importance of this vaccine. Advised may receive this vaccine at local pharmacy or Health Dept. Aware to provide a copy of the vaccination record if obtained from local pharmacy or Health Dept. Verbalized acceptance and understanding.  Flu Vaccine status: Up to date  Pneumococcal vaccine status: Up to date  Covid-19 vaccine status: Completed vaccines  Qualifies for Shingles Vaccine? Yes   Zostavax completed No   Shingrix Completed?: Yes  Screening Tests Health Maintenance  Topic Date Due   TETANUS/TDAP  Never done   COVID-19 Vaccine (5 - Booster for Moderna series) 07/09/2021   DEXA SCAN  08/02/2022 (Originally 05/24/1996)   Pneumonia Vaccine 33+ Years old  Completed   INFLUENZA VACCINE  Completed   Zoster Vaccines- Shingrix  Completed   HPV VACCINES  Aged Out    Health Maintenance  Health Maintenance Due  Topic Date Due   TETANUS/TDAP  Never done   COVID-19 Vaccine (5 - Booster for Moderna series) 07/09/2021    Colorectal cancer screening: No longer required.   Mammogram status: No longer required due to aged out.  Bone Density status: Ordered DEXA. Pt provided with  contact info and advised to call to schedule appt.  Lung Cancer Screening: (Low Dose CT Chest recommended if Age 64-80 years, 30 pack-year currently smoking OR have quit w/in 15years.) does not qualify.   Additional Screening:  Hepatitis C Screening: does not qualify  Vision Screening: Recommended annual ophthalmology exams for early detection of glaucoma and other disorders of the eye. Is the patient up to date with their annual eye exam?  No  Who is the provider or what is the name of the office in which the patient attends annual eye exams? Refer  if desires-declined.  If pt is not established with a provider, would they like to be referred to a provider to establish care? No .   Dental Screening: Recommended annual dental exams for proper oral hygiene  Community Resource Referral / Chronic Care Management: CRR required this visit?  No   CCM required this visit?  No      Plan:     I have personally reviewed and noted the following in the patients chart:   Medical and social history Use of alcohol, tobacco or illicit drugs  Current medications and supplements including opioid prescriptions.  Functional ability and status Nutritional status Physical activity Advanced directives List of other physicians Hospitalizations, surgeries, and ER visits in previous 12 months Vitals Screenings to include cognitive, depression, and falls Referrals and appointments  In addition, I have reviewed and discussed with patient certain preventive protocols, quality metrics, and best practice recommendations. A written personalized care plan for preventive services as well as general preventive health recommendations were provided to patient.     Iris Hairston X Renna Kilmer, NP   09/30/2021

## 2021-09-25 DIAGNOSIS — R2689 Other abnormalities of gait and mobility: Secondary | ICD-10-CM | POA: Diagnosis not present

## 2021-09-25 DIAGNOSIS — M25551 Pain in right hip: Secondary | ICD-10-CM | POA: Diagnosis not present

## 2021-09-25 DIAGNOSIS — S32501A Unspecified fracture of right pubis, initial encounter for closed fracture: Secondary | ICD-10-CM | POA: Diagnosis not present

## 2021-09-25 DIAGNOSIS — M6281 Muscle weakness (generalized): Secondary | ICD-10-CM | POA: Diagnosis not present

## 2021-10-04 DIAGNOSIS — G309 Alzheimer's disease, unspecified: Secondary | ICD-10-CM | POA: Diagnosis not present

## 2021-10-04 DIAGNOSIS — F330 Major depressive disorder, recurrent, mild: Secondary | ICD-10-CM | POA: Diagnosis not present

## 2021-10-10 ENCOUNTER — Encounter: Payer: Self-pay | Admitting: Nurse Practitioner

## 2021-10-10 DIAGNOSIS — M810 Age-related osteoporosis without current pathological fracture: Secondary | ICD-10-CM | POA: Insufficient documentation

## 2021-10-11 ENCOUNTER — Encounter: Payer: Self-pay | Admitting: Nurse Practitioner

## 2021-10-11 ENCOUNTER — Non-Acute Institutional Stay (SKILLED_NURSING_FACILITY): Payer: PPO | Admitting: Nurse Practitioner

## 2021-10-11 DIAGNOSIS — G309 Alzheimer's disease, unspecified: Secondary | ICD-10-CM | POA: Diagnosis not present

## 2021-10-11 DIAGNOSIS — E559 Vitamin D deficiency, unspecified: Secondary | ICD-10-CM | POA: Diagnosis not present

## 2021-10-11 DIAGNOSIS — F02C0 Dementia in other diseases classified elsewhere, severe, without behavioral disturbance, psychotic disturbance, mood disturbance, and anxiety: Secondary | ICD-10-CM | POA: Diagnosis not present

## 2021-10-11 DIAGNOSIS — K219 Gastro-esophageal reflux disease without esophagitis: Secondary | ICD-10-CM | POA: Diagnosis not present

## 2021-10-11 DIAGNOSIS — F0393 Unspecified dementia, unspecified severity, with mood disturbance: Secondary | ICD-10-CM

## 2021-10-11 DIAGNOSIS — I10 Essential (primary) hypertension: Secondary | ICD-10-CM

## 2021-10-11 DIAGNOSIS — S32591D Other specified fracture of right pubis, subsequent encounter for fracture with routine healing: Secondary | ICD-10-CM | POA: Diagnosis not present

## 2021-10-11 DIAGNOSIS — C44719 Basal cell carcinoma of skin of left lower limb, including hip: Secondary | ICD-10-CM | POA: Diagnosis not present

## 2021-10-11 DIAGNOSIS — M810 Age-related osteoporosis without current pathological fracture: Secondary | ICD-10-CM | POA: Diagnosis not present

## 2021-10-11 DIAGNOSIS — E039 Hypothyroidism, unspecified: Secondary | ICD-10-CM | POA: Diagnosis not present

## 2021-10-11 DIAGNOSIS — S22000D Wedge compression fracture of unspecified thoracic vertebra, subsequent encounter for fracture with routine healing: Secondary | ICD-10-CM

## 2021-10-11 NOTE — Assessment & Plan Note (Signed)
Stable, takes Omeprazole. Hgb 12.0 02/28/21

## 2021-10-11 NOTE — Assessment & Plan Note (Signed)
mood is stabilized, takes Sertraline. Psych recommended GDR

## 2021-10-11 NOTE — Assessment & Plan Note (Signed)
Blood pressure is controlled, takes Atenolol, Losartan, Bun/creat 20/0.9 02/28/21

## 2021-10-11 NOTE — Assessment & Plan Note (Addendum)
10/09/21 DEXA t score -3.674, Ca 800mg  qd, Fosamx weekly, currently pelvic fx pathologic vs traumatic?

## 2021-10-11 NOTE — Assessment & Plan Note (Signed)
Left foot plantar skin basal cell carcinoma, saw Dermatology, s/p Mohs surgery, healing nicely.

## 2021-10-11 NOTE — Assessment & Plan Note (Signed)
takes Donepezil, Memantine, seen neurology 

## 2021-10-11 NOTE — Assessment & Plan Note (Addendum)
Acute nondisplaced fracture of the right inferior pubic ramus and suspected subtle acute nondisplaced fracture in the right sacrum. Managing pain, continue Norco 5/325mg  1/2 tab bid, working with therapy.

## 2021-10-11 NOTE — Assessment & Plan Note (Signed)
Compression fracture of body of thoracic vertera, pain is controlled on Tylenol, Lidocaine.

## 2021-10-11 NOTE — Progress Notes (Signed)
Location:  [place] Room Number: N002/A Place of Service:  SNF (31) Provider:  Nargis Abrams Otho Darner, NP  Patient Care Team: [name], MD as PCP - General (Internal Medicine)  Extended Emergency Contact Information Primary Emergency Contact: [facility] Address: [address]          [place], [place] [address] [place] Phone: [phone] Relation: Spouse Secondary Emergency Contact: Suttyn, Cryder Mobile Phone: [phone] Relation: Daughter Interpreter needed? No  Code Status:  DNR Goals of care: Advanced Directive information Advanced Directives 10/11/2021  Does Patient Have a Medical Advance Directive? Yes  Type of Paramedic of [place];Living will;Out of facility DNR (pink MOST or yellow form)  Does patient want to make changes to medical advance directive? No - Patient declined  Copy of [place] in Chart? Yes - validated most recent copy scanned in chart (See row information)  Pre-existing out of facility DNR order (yellow form or pink MOST form) Pink MOST form placed in chart (order not valid for inpatient use)     Chief Complaint  Patient presents with   Medical Management of Chronic Issues    Routine visit   Quality Metric Gaps    Discuss the need for COVID and TDAP vaccine.    HPI:  Pt is a 88 y.o. female seen today for medical management of chronic diseases.       Pelvic fracture, CT . Acute nondisplaced fracture of the right inferior pubic ramus and suspected subtle acute nondisplaced fracture in the right sacrum. Managing pain with Norco, working with therapy.                Dementia, takes Donepezil, Memantine, seen neurology             Depression, mood is stabilized, takes Sertraline. Psych recommended GDR             HTN, takes Atenolol, Losartan, Bun/creat 20/0.9 02/28/21             Vit D deficiency, takes Vit D             Compression  fracture of body of thoracic vertera, pain is controlled on Tylenol, Lidocaine.              Hypothyroidism, takes Levothyroxine, TSH 0.75 02/28/21             GERD, takes Omeprazole. Hgb 12.0 02/28/21             Left foot plantar skin basal cell carcinoma, saw Dermatology, s/p Mohs surgery, healing nicely.   OP: 10/09/21 DEXA t score -3.674, Ca 800mg  qd, Fosamx weekly    Past Medical History:  Diagnosis Date   Barrett's esophagus    Dementia ([place])    Diverticulosis    Esophageal stricture    GERD (gastroesophageal reflux disease)    History of colonic polyps    History of hysterectomy    Hypertension    Hypothyroidism    Internal hemorrhoids    Kidney stone    pt denies 03/17/14   Memory difficulty 10/17/2015   Primary progressive aphasia ([place]) 01/22/2018   Past Surgical History:  Procedure Laterality Date   APPENDECTOMY     with hysterectomy   BREAST EXCISIONAL BIOPSY     right x 2 1962 and [place]  Allergies  Allergen Reactions   Penicillins Nausea Only    Outpatient Encounter Medications as of 10/11/2021  Medication Sig   alendronate (FOSAMAX) 70 MG tablet Take 70 mg by mouth once a week. Once a day on Saturday.  Take with a full glass of water on an empty stomach.   atenolol (TENORMIN) 25 MG tablet Take 25 mg by mouth daily.    Calcium Carbonate-Vit D-Min (CALCIUM 600+D3 PLUS MINERALS) 600-800 MG-UNIT TABS Take 1 tablet by mouth daily.   cholecalciferol (VITAMIN D) 1000 UNITS tablet Take 1,000 Units by mouth daily.    donepezil (ARICEPT) 5 MG tablet Take 1 tablet (5 mg total) by mouth 2 (two) times daily.   HYDROcodone-acetaminophen (NORCO/VICODIN) 5-325 MG tablet Take 0.5 tablets by mouth 2 (two) times daily.   levothyroxine (SYNTHROID) 25 MCG tablet Take 25 mcg by mouth. Saturday and Sunday   levothyroxine (SYNTHROID, LEVOTHROID) 50 MCG tablet Take by mouth daily. 50mg  Monday thru Friday. Saturday and Sunday 25mg  (half  tablet)   losartan (COZAAR) 25 MG tablet Take 25 mg by mouth 2 (two) times daily. Morning and at 3:00pm   memantine (NAMENDA) 10 MG tablet Take 1 tablet (10 mg total) by mouth 2 (two) times daily.   omeprazole (PRILOSEC) 20 MG capsule Take 20 mg by mouth daily.   sertraline (ZOLOFT) 25 MG tablet Take 25 mg by mouth daily.   Sodium Fluoride (PREVIDENT 5000 BOOSTER PLUS) 1.1 % PSTE Place 1 application onto teeth at bedtime.   UNABLE TO FIND Med Name: Magic Cup daily at 12 pm   No facility-administered encounter medications on file as of 10/11/2021.    Review of Systems  Unable to perform ROS: Dementia   Immunization History  Administered Date(s) Administered   Influenza-Unspecified 04/22/2019, 06/06/2020, 06/12/2021   Moderna SARS-COV2 Booster Vaccination 07/13/2020, 01/22/2021   Moderna Sars-Covid-2 Vaccination 08/29/2019, 09/26/2019   PFIZER(Purple Top)SARS-COV-2 Vaccination 05/14/2021   Pfizer Covid-19 Vaccine Bivalent Booster 29yrs & up 05/24/2021   Pneumococcal Conjugate-13 08/03/2013   Pneumococcal Polysaccharide-23 05/25/2006   Zoster Recombinat (Shingrix) 01/06/2018, 03/11/2018   Pertinent  Health Maintenance Due  Topic Date Due   DEXA SCAN  08/02/2022 (Originally 05/24/1996)   INFLUENZA VACCINE  Completed   Fall Risk 01/08/2017 01/22/2018 02/24/2020  Falls in the past year? No No -  Patient Fall Risk Level - - Low fall risk   Functional Status Survey:    Vitals:   10/11/21 0934  BP: 132/70  Pulse: 71  Resp: 16  Temp: 98.1 F (36.7 C)  SpO2: 96%  Weight: 95 lb 4.8 oz (43.2 kg)  Height: 4\' 11"  (1.499 m)   Body mass index is 19.25 kg/m. Physical Exam Vitals reviewed.  Constitutional:      Appearance: Normal appearance.  HENT:     Head: Normocephalic and atraumatic.     Mouth/Throat:     Mouth: Mucous membranes are moist.  Eyes:     Conjunctiva/sclera: Conjunctivae normal.     Pupils: Pupils are equal, round, and reactive to light.  Cardiovascular:     Rate  and Rhythm: Normal rate and regular rhythm.     Heart sounds: No murmur heard. Pulmonary:     Effort: Pulmonary effort is normal.     Breath sounds: No rales.  Abdominal:     General: Bowel sounds are normal.     Palpations: Abdomen is soft.     Tenderness: There is no abdominal tenderness.  Musculoskeletal:        General:  Tenderness present.     Cervical back: Normal range of motion and neck supple.     Right lower leg: No edema.     Left lower leg: No edema.     Comments: Grimace and guarded when PROM of the right leg and palpated the right pelvis.    Skin:    General: Skin is warm and dry.     Comments: Left midfoot plantar aspect skin lesion(hx of BCC) is healing nicely, s/p Mohs.    Neurological:     General: No focal deficit present.     Mental Status: She is alert. Mental status is at baseline.     Comments: Oriented to person  Psychiatric:        Mood and Affect: Mood normal.    Labs reviewed: Recent Labs    02/28/21 0000 09/10/21 0000  NA 138 139  K 4.4 4.0  CL 104 106  CO2 25* 27*  BUN 20 30*  CREATININE 0.9 1.1  CALCIUM 8.6* 8.8   Recent Labs    02/28/21 0000 09/10/21 0000  AST 13 10*  ALT 10 9  ALKPHOS 75 74  ALBUMIN 3.8 3.6   Recent Labs    02/28/21 0000 09/10/21 0000  WBC 7.0 8.4  NEUTROABS 4,270.00 5,653.00  HGB 12.0 12.0  HCT 36 36  PLT 210 177   Lab Results  Component Value Date   TSH 0.75 02/28/2021   No results found for: HGBA1C No results found for: CHOL, HDL, LDLCALC, LDLDIRECT, TRIG, CHOLHDL  Significant Diagnostic Results in last 30 days:  No results found.  Assessment/Plan Osteoporosis 10/09/21 DEXA t score -3.674, Ca 800mg  qd, Fosamx weekly, currently pelvic fx pathologic vs traumatic?   Closed fracture of single pubic ramus of pelvis with routine healing, right Acute nondisplaced fracture of the right inferior pubic ramus and suspected subtle acute nondisplaced fracture in the right sacrum. Managing pain, continue  Norco 5/325mg  1/2 tab bid, working with therapy.   Alzheimer's dementia ([place]) takes Donepezil, Memantine, seen neurology  Depression due to dementia [facility]) mood is stabilized, takes Sertraline. Psych recommended GDR  HTN (hypertension) Blood pressure is controlled, takes Atenolol, Losartan, Bun/creat 20/0.9 02/28/21  Vitamin D deficiency takes Vit D  Thoracic compression fracture ([facility])  Compression fracture of body of thoracic vertera, pain is controlled on Tylenol, Lidocaine.   Hypothyroidism takes Levothyroxine, TSH 0.75 02/28/21  GERD Stable, takes Omeprazole. Hgb 12.0 02/28/21  Basal cell carcinoma of foot, left Left foot plantar skin basal cell carcinoma, saw Dermatology, s/p Mohs surgery, healing nicely.      Family/ staff Communication: plan of care reviewed with the patient and charge nurse.   Labs/tests ordered: none  Time spend 35 minutes.

## 2021-10-11 NOTE — Assessment & Plan Note (Signed)
takes Vit D 

## 2021-10-11 NOTE — Assessment & Plan Note (Signed)
takes Levothyroxine, TSH 0.75 02/28/21

## 2021-10-23 DIAGNOSIS — M25551 Pain in right hip: Secondary | ICD-10-CM | POA: Diagnosis not present

## 2021-10-23 DIAGNOSIS — S32501A Unspecified fracture of right pubis, initial encounter for closed fracture: Secondary | ICD-10-CM | POA: Diagnosis not present

## 2021-10-23 DIAGNOSIS — R2689 Other abnormalities of gait and mobility: Secondary | ICD-10-CM | POA: Diagnosis not present

## 2021-10-23 DIAGNOSIS — M6281 Muscle weakness (generalized): Secondary | ICD-10-CM | POA: Diagnosis not present

## 2021-11-04 ENCOUNTER — Non-Acute Institutional Stay (SKILLED_NURSING_FACILITY): Payer: PPO | Admitting: Nurse Practitioner

## 2021-11-04 DIAGNOSIS — C44719 Basal cell carcinoma of skin of left lower limb, including hip: Secondary | ICD-10-CM

## 2021-11-04 DIAGNOSIS — E039 Hypothyroidism, unspecified: Secondary | ICD-10-CM | POA: Diagnosis not present

## 2021-11-04 DIAGNOSIS — I10 Essential (primary) hypertension: Secondary | ICD-10-CM

## 2021-11-04 DIAGNOSIS — S22000D Wedge compression fracture of unspecified thoracic vertebra, subsequent encounter for fracture with routine healing: Secondary | ICD-10-CM | POA: Diagnosis not present

## 2021-11-04 DIAGNOSIS — E559 Vitamin D deficiency, unspecified: Secondary | ICD-10-CM

## 2021-11-04 DIAGNOSIS — F02C0 Dementia in other diseases classified elsewhere, severe, without behavioral disturbance, psychotic disturbance, mood disturbance, and anxiety: Secondary | ICD-10-CM

## 2021-11-04 DIAGNOSIS — S32591D Other specified fracture of right pubis, subsequent encounter for fracture with routine healing: Secondary | ICD-10-CM

## 2021-11-04 DIAGNOSIS — M810 Age-related osteoporosis without current pathological fracture: Secondary | ICD-10-CM | POA: Diagnosis not present

## 2021-11-04 DIAGNOSIS — F0393 Unspecified dementia, unspecified severity, with mood disturbance: Secondary | ICD-10-CM | POA: Diagnosis not present

## 2021-11-04 DIAGNOSIS — G309 Alzheimer's disease, unspecified: Secondary | ICD-10-CM | POA: Diagnosis not present

## 2021-11-04 DIAGNOSIS — K219 Gastro-esophageal reflux disease without esophagitis: Secondary | ICD-10-CM | POA: Diagnosis not present

## 2021-11-05 NOTE — Assessment & Plan Note (Addendum)
Left foot plantar skin basal cell carcinoma, saw Dermatology, s/p Mohs surgery, healed ?

## 2021-11-05 NOTE — Assessment & Plan Note (Signed)
Stable, takes Omeprazole. Hgb 12.0 09/10/21 

## 2021-11-05 NOTE — Assessment & Plan Note (Signed)
Pelvic fracture, CT . Acute nondisplaced fracture of the right inferior pubic ramus and ?suspected subtle acute nondisplaced fracture in the right sacrum. Managing pain with Norco, working with therapy.  ?

## 2021-11-05 NOTE — Assessment & Plan Note (Signed)
,   mood is stabilized, takes Sertraline. Psych recommended GDR ?

## 2021-11-05 NOTE — Assessment & Plan Note (Signed)
10/09/21 DEXA t score -3.674, Ca 800mg qd, Fosamx weekly 

## 2021-11-05 NOTE — Assessment & Plan Note (Signed)
takes Vit D 

## 2021-11-05 NOTE — Assessment & Plan Note (Signed)
takes Levothyroxine, TSH 0.75 02/28/21 ?

## 2021-11-05 NOTE — Assessment & Plan Note (Signed)
Blood pressure is controlled, takes Atenolol, Losartan, Bun/creat 30/1.1 09/10/21 

## 2021-11-05 NOTE — Assessment & Plan Note (Signed)
No behavioral issues, takes Donepezil, Memantine, seen neurology ?

## 2021-11-05 NOTE — Progress Notes (Signed)
?Location:   SNF [facility] ?Nursing Home Room Number: 2 ?Place of Service:  SNF (31) ?Provider: Marlana Latus NP ? ?[name], MD ? ?Patient Care Team: ?[name], MD as PCP - General (Internal Medicine) ? ?Extended Emergency Contact Information ?Primary Emergency Contact: Nielsen,Martin ?Address: [address] ?         [place], [place] [address] [place] ?Home Phone: [phone] ?Relation: Spouse ?Secondary Emergency Contact: Ponciano Ort ?Mobile Phone: [phone] ?Relation: Daughter ?Interpreter needed? No ? ?Code Status:  DNR ?Goals of care: Advanced Directive information ?Advanced Directives 10/11/2021  ?Does Patient Have a Medical Advance Directive? Yes  ?Type of Paramedic of [place];Living will;Out of facility DNR (pink MOST or yellow form)  ?Does patient want to make changes to medical advance directive? No - Patient declined  ?Copy of [place] in Chart? Yes - validated most recent copy scanned in chart (See row information)  ?Pre-existing out of facility DNR order (yellow form or pink MOST form) Pink MOST form placed in chart (order not valid for inpatient use)  ? ? ? ?Chief Complaint  ?Patient presents with  ? Medical Management of Chronic Issues  ? ? ?HPI:  ?Pt is a 88 y.o. female seen today for medical management of chronic diseases.   ? Pelvic fracture, CT . Acute nondisplaced fracture of the right inferior pubic ramus and ?suspected subtle acute nondisplaced fracture in the right sacrum. Managing pain with Norco, working with therapy.  ?              Dementia, takes Donepezil, Memantine, seen neurology ?            Depression, mood is stabilized, takes Sertraline. Psych recommended GDR ?            HTN, takes Atenolol, Losartan, Bun/creat 30/1.1 09/10/21 ?            Vit D deficiency, takes Vit D ?            Compression fracture of body of thoracic vertera, pain is controlled on Tylenol, Lidocaine.  ?             Hypothyroidism, takes Levothyroxine, TSH 0.75 02/28/21 ?            GERD, takes Omeprazole. Hgb 12.0 09/10/21 ?            Left foot plantar skin basal cell carcinoma, saw Dermatology, s/p Mohs surgery, healing nicely.  ?            OP: 10/09/21 DEXA t score -3.674, Ca '800mg'$  qd, Fosamx weekly ? ?Past Medical History:  ?Diagnosis Date  ? Barrett's esophagus   ? Dementia ([place])   ? Diverticulosis   ? Esophageal stricture   ? GERD (gastroesophageal reflux disease)   ? History of colonic polyps   ? History of hysterectomy   ? Hypertension   ? Hypothyroidism   ? Internal hemorrhoids   ? Kidney stone   ? pt denies 03/17/14  ? Memory difficulty 10/17/2015  ? Primary progressive aphasia ([place]) 01/22/2018  ? ?Past Surgical History:  ?Procedure Laterality Date  ? APPENDECTOMY    ? with hysterectomy  ? BREAST EXCISIONAL BIOPSY    ? right x 2 1962 and 1982  ? THYROIDECTOMY, PARTIAL    ? TONSILLECTOMY    ? ? ?Allergies  ?Allergen Reactions  ? Penicillins Nausea Only  ? ? ?  Allergies as of 11/04/2021   ? ?   Reactions  ? Penicillins Nausea Only  ? ?  ? ?  ?Medication List  ?  ? ?  ? Accurate as of November 04, 2021 11:59 PM. If you have any questions, ask your nurse or doctor.  ?  ?  ? ?  ? ?alendronate 70 MG tablet ?Commonly known as: FOSAMAX ?Take 70 mg by mouth once a week. Once a day on Saturday.  ?Take with a full glass of water on an empty stomach. ?  ?atenolol 25 MG tablet ?Commonly known as: TENORMIN ?Take 25 mg by mouth daily. ?  ?Calcium 600+D3 Plus Minerals 600-800 MG-UNIT Tabs ?Take 1 tablet by mouth daily. ?  ?cholecalciferol 1000 units tablet ?Commonly known as: VITAMIN D ?Take 1,000 Units by mouth daily. ?  ?donepezil 5 MG tablet ?Commonly known as: ARICEPT ?Take 1 tablet (5 mg total) by mouth 2 (two) times daily. ?  ?HYDROcodone-acetaminophen 5-325 MG tablet ?Commonly known as: NORCO/VICODIN ?Take 0.5 tablets by mouth 2 (two) times daily. ?  ?levothyroxine 25 MCG tablet ?Commonly known as: SYNTHROID ?Take 25 mcg by mouth.  Saturday and Sunday ?  ?levothyroxine 50 MCG tablet ?Commonly known as: SYNTHROID ?Take by mouth daily. '50mg'$  Monday thru Friday. ?Saturday and Sunday '25mg'$  (half tablet) ?  ?losartan 25 MG tablet ?Commonly known as: COZAAR ?Take 25 mg by mouth 2 (two) times daily. Morning and at 3:00pm ?  ?memantine 10 MG tablet ?Commonly known as: Namenda ?Take 1 tablet (10 mg total) by mouth 2 (two) times daily. ?  ?omeprazole 20 MG capsule ?Commonly known as: PRILOSEC ?Take 20 mg by mouth daily. ?  ?PreviDent 5000 Booster Plus 1.1 % Pste ?Generic drug: Sodium Fluoride ?Place 1 application onto teeth at bedtime. ?  ?sertraline 25 MG tablet ?Commonly known as: ZOLOFT ?Take 25 mg by mouth daily. ?  ?UNABLE TO FIND ?Med Name: Magic Cup daily at 12 pm ?  ? ?  ? ? ?Review of Systems  ?Unable to perform ROS: Dementia  ? ?Immunization History  ?Administered Date(s) Administered  ? Influenza-Unspecified 04/22/2019, 06/06/2020, 06/12/2021  ? Moderna SARS-COV2 Booster Vaccination 07/13/2020, 01/22/2021  ? Moderna Sars-Covid-2 Vaccination 08/29/2019, 09/26/2019  ? PFIZER(Purple Top)SARS-COV-2 Vaccination 05/14/2021  ? Pension scheme manager 10yr & up 05/24/2021  ? Pneumococcal Conjugate-13 08/03/2013  ? Pneumococcal Polysaccharide-23 05/25/2006  ? Zoster Recombinat (Shingrix) 01/06/2018, 03/11/2018  ? ?Pertinent  Health Maintenance Due  ?Topic Date Due  ? DEXA SCAN  08/02/2022 (Originally 05/24/1996)  ? INFLUENZA VACCINE  Completed  ? ?Fall Risk 01/08/2017 01/22/2018 02/24/2020  ?Falls in the past year? No No -  ?Patient Fall Risk Level - - Low fall risk  ? ?Functional Status Survey: ?  ? ?Vitals:  ? 11/04/21 1455  ?BP: (!) 150/62  ?Pulse: 68  ?Resp: 18  ?Temp: (!) 97.5 ?F (36.4 ?C)  ?SpO2: 95%  ? ?There is no height or weight on file to calculate BMI. ?Physical Exam ?Vitals reviewed.  ?Constitutional:   ?   Appearance: Normal appearance.  ?HENT:  ?   Head: Normocephalic and atraumatic.  ?   Mouth/Throat:  ?   Mouth: Mucous  membranes are moist.  ?Eyes:  ?   Conjunctiva/sclera: Conjunctivae normal.  ?   Pupils: Pupils are equal, round, and reactive to light.  ?Cardiovascular:  ?   Rate and Rhythm: Normal rate and regular rhythm.  ?   Heart sounds: No murmur heard. ?Pulmonary:  ?   Effort:  Pulmonary effort is normal.  ?   Breath sounds: No rales.  ?Abdominal:  ?   General: Bowel sounds are normal.  ?   Palpations: Abdomen is soft.  ?   Tenderness: There is no abdominal tenderness.  ?Musculoskeletal:     ?   General: Tenderness present.  ?   Cervical back: Normal range of motion and neck supple.  ?   Right lower leg: No edema.  ?   Left lower leg: No edema.  ?   Comments: Ambulates with walker, therapy.  ?  ?Skin: ?   General: Skin is warm and dry.  ?   Comments: Left midfoot plantar aspect skin lesion(hx of BCC) is healed  ?Neurological:  ?   General: No focal deficit present.  ?   Mental Status: She is alert. Mental status is at baseline.  ?   Comments: Oriented to person  ?Psychiatric:     ?   Mood and Affect: Mood normal.  ? ? ?Labs reviewed: ?Recent Labs  ?  02/28/21 ?0000 09/10/21 ?0000  ?NA 138 139  ?K 4.4 4.0  ?CL 104 106  ?CO2 25* 27*  ?BUN 20 30*  ?CREATININE 0.9 1.1  ?CALCIUM 8.6* 8.8  ? ?Recent Labs  ?  02/28/21 ?0000 09/10/21 ?0000  ?AST 13 10*  ?ALT 10 9  ?ALKPHOS 75 74  ?ALBUMIN 3.8 3.6  ? ?Recent Labs  ?  02/28/21 ?0000 09/10/21 ?0000  ?WBC 7.0 8.4  ?NEUTROABS 4,270.00 5,653.00  ?HGB 12.0 12.0  ?HCT 36 36  ?PLT 210 177  ? ?Lab Results  ?Component Value Date  ? TSH 0.75 02/28/2021  ? ?No results found for: HGBA1C ?No results found for: CHOL, HDL, LDLCALC, LDLDIRECT, TRIG, CHOLHDL ? ?Significant Diagnostic Results in last 30 days:  ?No results found. ? ?Assessment/Plan ? ?HTN (hypertension) ?Blood pressure is controlled,  takes Atenolol, Losartan, Bun/creat 30/1.1 09/10/21 ? ?Vitamin D deficiency ? takes Vit D ? ?Thoracic compression fracture ([place]) ?Compression fracture of body of thoracic vertera, pain is controlled on  Tylenol, Lidocaine.  ? ?Hypothyroidism ? takes Levothyroxine, TSH 0.75 02/28/21 ? ?GERD ?Stable,  takes Omeprazole. Hgb 12.0 09/10/21 ? ?Basal cell carcinoma of foot, left ?Left foot plantar skin basal cell carcinoma

## 2021-11-05 NOTE — Assessment & Plan Note (Signed)
Compression fracture of body of thoracic vertera, pain is controlled on Tylenol, Lidocaine.  ?

## 2021-11-08 ENCOUNTER — Encounter: Payer: Self-pay | Admitting: Nurse Practitioner

## 2021-11-25 DIAGNOSIS — M25551 Pain in right hip: Secondary | ICD-10-CM | POA: Diagnosis not present

## 2021-11-25 DIAGNOSIS — M6281 Muscle weakness (generalized): Secondary | ICD-10-CM | POA: Diagnosis not present

## 2021-11-25 DIAGNOSIS — S32501A Unspecified fracture of right pubis, initial encounter for closed fracture: Secondary | ICD-10-CM | POA: Diagnosis not present

## 2021-11-25 DIAGNOSIS — R2689 Other abnormalities of gait and mobility: Secondary | ICD-10-CM | POA: Diagnosis not present

## 2021-11-26 DIAGNOSIS — G309 Alzheimer's disease, unspecified: Secondary | ICD-10-CM | POA: Diagnosis not present

## 2021-11-26 DIAGNOSIS — F330 Major depressive disorder, recurrent, mild: Secondary | ICD-10-CM | POA: Diagnosis not present

## 2021-12-03 ENCOUNTER — Encounter: Payer: Self-pay | Admitting: Internal Medicine

## 2021-12-03 ENCOUNTER — Non-Acute Institutional Stay (SKILLED_NURSING_FACILITY): Payer: PPO | Admitting: Internal Medicine

## 2021-12-03 DIAGNOSIS — M810 Age-related osteoporosis without current pathological fracture: Secondary | ICD-10-CM | POA: Diagnosis not present

## 2021-12-03 DIAGNOSIS — F02C0 Dementia in other diseases classified elsewhere, severe, without behavioral disturbance, psychotic disturbance, mood disturbance, and anxiety: Secondary | ICD-10-CM

## 2021-12-03 DIAGNOSIS — K219 Gastro-esophageal reflux disease without esophagitis: Secondary | ICD-10-CM | POA: Diagnosis not present

## 2021-12-03 DIAGNOSIS — E039 Hypothyroidism, unspecified: Secondary | ICD-10-CM

## 2021-12-03 DIAGNOSIS — G309 Alzheimer's disease, unspecified: Secondary | ICD-10-CM | POA: Diagnosis not present

## 2021-12-03 DIAGNOSIS — I10 Essential (primary) hypertension: Secondary | ICD-10-CM

## 2021-12-03 NOTE — Progress Notes (Signed)
?Location:   [facility] ?Nursing Home Room Number: 3 ?Place of Service:  SNF (31) ?Provider:  Veleta Miners MD ? ?[name], MD ? ?Patient Care Team: ?[name], MD as PCP - General (Internal Medicine) ? ?Extended Emergency Contact Information ?Primary Emergency Contact: Nielsen,Martin ?Address: [address] ?         [address] ?         [place],  [address] [place] ?Home Phone: [phone] ?Relation: Spouse ?Secondary Emergency Contact: Ponciano Ort ?Mobile Phone: [phone] ?Relation: Daughter ?Interpreter needed? No ? ?Code Status:  DNR Managed Care ?Goals of care: Advanced Directive information ? ?  12/03/2021  ?  3:29 PM  ?Advanced Directives  ?Does Patient Have a Medical Advance Directive? Yes  ?Type of Paramedic of [place];Living will;Out of facility DNR (pink MOST or yellow form)  ?Does patient want to make changes to medical advance directive? No - Patient declined  ?Copy of [place] in Chart? Yes - validated most recent copy scanned in chart (See row information)  ?Pre-existing out of facility DNR order (yellow form or pink MOST form) Pink MOST form placed in chart (order not valid for inpatient use)  ? ? ? ?Chief Complaint  ?Patient presents with  ? Medical Management of Chronic Issues  ? Quality Metric Gaps  ?  Verified Matrix and [facility] patient is due for TDAP   ? ? ?HPI:  ?Pt is a 88 y.o. female seen today for medical management of chronic diseases.   ? ?Has history of hypertension, hypothyroidism, and GERD, left plantar basal cell cancer, Alzheimer's dementia with aphasia ? T 5 Compression Fracture and L2 Compression Osteoporosis.  ?Depression ?H/o Pelvic fracture ? ?  ?She is stable. ?Her zoloft was decreased to try GDR ?But today Nurses says that she has been more agitated with her care ?Unable to get much history due to her aphasia ?She is now walking with mild assist ?No Falls ?Wt Readings from Last 3  Encounters:  ?12/03/21 92 lb 9.6 oz (42 kg)  ?10/11/21 95 lb 4.8 oz (43.2 kg)  ?09/17/21 98 lb 11.2 oz (44.8 kg)  ?Weight is slightly down ? ?Past Medical History:  ?Diagnosis Date  ? Barrett's esophagus   ? Dementia ([place])   ? Diverticulosis   ? Esophageal stricture   ? GERD (gastroesophageal reflux disease)   ? History of colonic polyps   ? History of hysterectomy   ? Hypertension   ? Hypothyroidism   ? Internal hemorrhoids   ? Kidney stone   ? pt denies 03/17/14  ? Memory difficulty 10/17/2015  ? Primary progressive aphasia ([place]) 01/22/2018  ? ?Past Surgical History:  ?Procedure Laterality Date  ? APPENDECTOMY    ? with hysterectomy  ? BREAST EXCISIONAL BIOPSY    ? right x 2 1962 and 1982  ? THYROIDECTOMY, PARTIAL    ? TONSILLECTOMY    ? ? ?Allergies  ?Allergen Reactions  ? Penicillins Nausea Only  ? ? ?Allergies as of 12/03/2021   ? ?   Reactions  ? Penicillins Nausea Only  ? ?  ? ?  ?Medication List  ?  ? ?  ? Accurate as of December 03, 2021  3:30 PM. If you have any questions, ask your nurse or doctor.  ?  ?  ? ?  ? ?acetaminophen 325 MG tablet ?Commonly known as: TYLENOL ?Take 650 mg by mouth every 6 (six) hours as needed. ?  ?  alendronate 70 MG tablet ?Commonly known as: FOSAMAX ?Take 70 mg by mouth once a week. Once a day on Saturday.  ?Take with a full glass of water on an empty stomach. ?  ?atenolol 25 MG tablet ?Commonly known as: TENORMIN ?Take 25 mg by mouth daily. ?  ?Calcium 600+D3 Plus Minerals 600-800 MG-UNIT Tabs ?Take 1 tablet by mouth daily. ?  ?cholecalciferol 1000 units tablet ?Commonly known as: VITAMIN D ?Take 1,000 Units by mouth daily. ?  ?donepezil 5 MG tablet ?Commonly known as: ARICEPT ?Take 1 tablet (5 mg total) by mouth 2 (two) times daily. ?  ?HYDROcodone-acetaminophen 5-325 MG tablet ?Commonly known as: NORCO/VICODIN ?Take 0.5 tablets by mouth 2 (two) times daily. ?  ?levothyroxine 25 MCG tablet ?Commonly known as: SYNTHROID ?Take 25 mcg by mouth. Saturday and Sunday ?  ?levothyroxine 50  MCG tablet ?Commonly known as: SYNTHROID ?Take by mouth daily. '50mg'$  Monday thru Friday. ?Saturday and Sunday '25mg'$  (half tablet) ?  ?losartan 25 MG tablet ?Commonly known as: COZAAR ?Take 25 mg by mouth 2 (two) times daily. Morning and at 3:00pm ?  ?memantine 10 MG tablet ?Commonly known as: Namenda ?Take 1 tablet (10 mg total) by mouth 2 (two) times daily. ?  ?omeprazole 20 MG capsule ?Commonly known as: PRILOSEC ?Take 20 mg by mouth daily. ?  ?PreviDent 5000 Booster Plus 1.1 % Pste ?Generic drug: Sodium Fluoride ?Place 1 application onto teeth at bedtime. ?  ?sertraline 25 MG tablet ?Commonly known as: ZOLOFT ?Take 12.5 mg by mouth daily. ?  ?UNABLE TO FIND ?Med Name: Magic Cup daily at 12 pm ?  ? ?  ? ? ?Review of Systems  ?Unable to perform ROS: Dementia  ? ?Immunization History  ?Administered Date(s) Administered  ? Influenza-Unspecified 04/22/2019, 06/06/2020, 06/12/2021  ? Moderna SARS-COV2 Booster Vaccination 07/13/2020, 01/22/2021  ? Moderna Sars-Covid-2 Vaccination 08/29/2019, 09/26/2019  ? PFIZER(Purple Top)SARS-COV-2 Vaccination 05/14/2021  ? Pension scheme manager 67yr & up 05/24/2021  ? Pneumococcal Conjugate-13 08/03/2013  ? Pneumococcal Polysaccharide-23 05/25/2006  ? Zoster Recombinat (Shingrix) 01/06/2018, 03/11/2018  ? ?Pertinent  Health Maintenance Due  ?Topic Date Due  ? DEXA SCAN  08/02/2022 (Originally 05/24/1996)  ? INFLUENZA VACCINE  03/25/2022  ? ? ?  01/08/2017  ?  3:42 PM 01/22/2018  ? 10:45 AM 02/24/2020  ?  8:37 AM  ?Fall Risk  ?Falls in the past year? No No   ?Patient Fall Risk Level   Low fall risk  ? ?Functional Status Survey: ?  ? ?Vitals:  ? 12/03/21 1523  ?BP: 116/82  ?Pulse: 64  ?Resp: 16  ?Temp: (!) 97.2 ?F (36.2 ?C)  ?SpO2: 99%  ?Weight: 92 lb 9.6 oz (42 kg)  ?Height: '4\' 11"'$  (1.499 m)  ? ?Body mass index is 18.7 kg/m?.Marland Kitchen?Physical Exam ?Vitals reviewed.  ?Constitutional:   ?   Appearance: Normal appearance.  ?HENT:  ?   Head: Normocephalic.  ?   Nose: Nose normal.  ?    Mouth/Throat:  ?   Mouth: Mucous membranes are moist.  ?   Pharynx: Oropharynx is clear.  ?Eyes:  ?   Pupils: Pupils are equal, round, and reactive to light.  ?Cardiovascular:  ?   Rate and Rhythm: Normal rate and regular rhythm.  ?   Pulses: Normal pulses.  ?   Heart sounds: Normal heart sounds. No murmur heard. ?Pulmonary:  ?   Effort: Pulmonary effort is normal.  ?   Breath sounds: Normal breath sounds.  ?Abdominal:  ?  General: Abdomen is flat. Bowel sounds are normal.  ?   Palpations: Abdomen is soft.  ?Musculoskeletal:     ?   General: No swelling.  ?   Cervical back: Neck supple.  ?Skin: ?   General: Skin is warm.  ?Neurological:  ?   General: No focal deficit present.  ?   Mental Status: She is alert.  ?Psychiatric:     ?   Mood and Affect: Mood normal.     ?   Thought Content: Thought content normal.  ? ? ?Labs reviewed: ?Recent Labs  ?  02/28/21 ?0000 09/10/21 ?0000  ?NA 138 139  ?K 4.4 4.0  ?CL 104 106  ?CO2 25* 27*  ?BUN 20 30*  ?CREATININE 0.9 1.1  ?CALCIUM 8.6* 8.8  ? ?Recent Labs  ?  02/28/21 ?0000 09/10/21 ?0000  ?AST 13 10*  ?ALT 10 9  ?ALKPHOS 75 74  ?ALBUMIN 3.8 3.6  ? ?Recent Labs  ?  02/28/21 ?0000 09/10/21 ?0000  ?WBC 7.0 8.4  ?NEUTROABS 4,270.00 5,653.00  ?HGB 12.0 12.0  ?HCT 36 36  ?PLT 210 177  ? ?Lab Results  ?Component Value Date  ? TSH 0.75 02/28/2021  ? ?No results found for: HGBA1C ?No results found for: CHOL, HDL, LDLCALC, LDLDIRECT, TRIG, CHOLHDL ? ?Significant Diagnostic Results in last 30 days:  ?No results found. ? ?Assessment/Plan ?1. Hypertension, unspecified type ?On  Cozaar ? ?2. Severe Alzheimer's dementia without behavioral disturbance, psychotic disturbance, mood disturbance, or anxiety, unspecified timing of dementia onset ([place]) ?On Aricept and Namenda ? ?3. Hypothyroidism, unspecified type ?TSH normal in 7/22 ? ?4. Gastroesophageal reflux disease without esophagitis ?On Prilosec ? ?5. Age related osteoporosis, unspecified pathological fracture presence ?Started on  Fosamax in 02/23 ?DEXA T score -3.6 ? ?6 Depression ?Will change her Zoloft to 25 mg again due to behavior issues ? ?Family/ staff Communication:  ? ?Labs/tests ordered:   ? ?  ?

## 2021-12-17 DIAGNOSIS — Q6689 Other  specified congenital deformities of feet: Secondary | ICD-10-CM | POA: Diagnosis not present

## 2021-12-17 DIAGNOSIS — L602 Onychogryphosis: Secondary | ICD-10-CM | POA: Diagnosis not present

## 2022-01-15 ENCOUNTER — Encounter: Payer: Self-pay | Admitting: Orthopedic Surgery

## 2022-01-15 ENCOUNTER — Non-Acute Institutional Stay (SKILLED_NURSING_FACILITY): Payer: PPO | Admitting: Orthopedic Surgery

## 2022-01-15 DIAGNOSIS — F02C0 Dementia in other diseases classified elsewhere, severe, without behavioral disturbance, psychotic disturbance, mood disturbance, and anxiety: Secondary | ICD-10-CM | POA: Diagnosis not present

## 2022-01-15 DIAGNOSIS — R1319 Other dysphagia: Secondary | ICD-10-CM | POA: Diagnosis not present

## 2022-01-15 DIAGNOSIS — F0393 Unspecified dementia, unspecified severity, with mood disturbance: Secondary | ICD-10-CM | POA: Diagnosis not present

## 2022-01-15 DIAGNOSIS — G309 Alzheimer's disease, unspecified: Secondary | ICD-10-CM

## 2022-01-15 DIAGNOSIS — I10 Essential (primary) hypertension: Secondary | ICD-10-CM

## 2022-01-15 DIAGNOSIS — R634 Abnormal weight loss: Secondary | ICD-10-CM

## 2022-01-15 DIAGNOSIS — M810 Age-related osteoporosis without current pathological fracture: Secondary | ICD-10-CM

## 2022-01-15 DIAGNOSIS — K219 Gastro-esophageal reflux disease without esophagitis: Secondary | ICD-10-CM | POA: Diagnosis not present

## 2022-01-15 DIAGNOSIS — E039 Hypothyroidism, unspecified: Secondary | ICD-10-CM | POA: Diagnosis not present

## 2022-01-15 NOTE — Progress Notes (Signed)
Location:  [facility] Room Number: N003/A Place of Service:  SNF [phone]) Provider: Yvonna Alanis, NP   Patient Care Team: [name], MD as PCP - General (Internal Medicine)  Extended Emergency Contact Information Primary Emergency Contact: [facility] Address: [address]          [place], [place] [address] [place] Phone: [phone] Relation: Spouse Secondary Emergency Contact: Adriella, [place] Mobile Phone: [phone] Relation: Daughter Interpreter needed? No  Code Status:  DNR Goals of care: Advanced Directive information    01/15/2022    2:03 PM  Advanced Directives  Does Patient Have a Medical Advance Directive? Yes  Type of Paramedic of [place];Living will;Out of facility DNR (pink MOST or yellow form)  Does patient want to make changes to medical advance directive? No - Patient declined  Copy of [place] in Chart? Yes - validated most recent copy scanned in chart (See row information)  Pre-existing out of facility DNR order (yellow form or pink MOST form) Pink MOST form placed in chart (order not valid for inpatient use)     Chief Complaint  Patient presents with   Medical Management of Chronic Issues    Routine visit.   Quality Metric Gaps    Discuss the need for TDAP vaccine or post pone if patient refuses.     HPI:  Pt is a 88 y.o. female seen today for medical management of chronic diseases.     She currently resides on the skilled nursing unit at [facility] due to Alzheimer's dementia. PMH: HTN, Barrett's esophagus, dysphagia, GERD, hypothyroidism, basal cell carcinoma, and abnormal gait.   AD- MRI brain noted mild periventricular and subcortical chronic small vessel ischemic disease 10/2015, MMSE 16/30 2020, no behavioral outbursts, abnormal gait, remains on Aricept and Namenda HTN- BUN/creat 30/1.1 09/10/2021, remains on  losartan Hypothyroidism- TSH 0.75 02/2021, remains on levothyroxine Dysphagia- no recent aspirations, remains on regular diet with bite sized meats GERD- hgb 12.0 09/10/2021, remains on omeprazole Depression- no mood changes, remains on zoloft Osteoporosis- DEXA 10/09/2021, t score -3.674, remains on fosamax, calcium/vitamin D supplement Weight loss- see trends below, remains on magic cups  No recent falls or injuries.   Recent blood pressures:  05/22- 140/72  05/15- 120/60  05/08- 113/64  Recent weights:  05/01- 92 lbs  04/01- 92.6 lbs  03/01- 92.9 lbs     Past Medical History:  Diagnosis Date   Barrett's esophagus    Dementia ([place])    Diverticulosis    Esophageal stricture    GERD (gastroesophageal reflux disease)    History of colonic polyps    History of hysterectomy    Hypertension    Hypothyroidism    Internal hemorrhoids    Kidney stone    pt denies 03/17/14   Memory difficulty 10/17/2015   Primary progressive aphasia ([place]) 01/22/2018   Past Surgical History:  Procedure Laterality Date   APPENDECTOMY     with hysterectomy   BREAST EXCISIONAL BIOPSY     right x 2 1962 and 1982   THYROIDECTOMY, PARTIAL     TONSILLECTOMY      Allergies  Allergen Reactions   Penicillins Nausea Only    Outpatient Encounter Medications as of 01/15/2022  Medication Sig   acetaminophen (TYLENOL) 325 MG tablet Take 650 mg by mouth every 6 (six) hours as needed.   alendronate (  FOSAMAX) 70 MG tablet Take 70 mg by mouth once a week. Once a day on Saturday.  Take with a full glass of water on an empty stomach.   atenolol (TENORMIN) 25 MG tablet Take 25 mg by mouth daily.    Calcium Carbonate-Vit D-Min (CALCIUM 600+D3 PLUS MINERALS) 600-800 MG-UNIT TABS Take 1 tablet by mouth daily.   cholecalciferol (VITAMIN D) 1000 UNITS tablet Take 1,000 Units by mouth daily.    donepezil (ARICEPT) 5 MG tablet Take 1 tablet (5 mg total) by mouth 2 (two) times daily.   levothyroxine (SYNTHROID)  25 MCG tablet Take 25 mcg by mouth. Saturday and Sunday   levothyroxine (SYNTHROID, LEVOTHROID) 50 MCG tablet Take by mouth daily. '50mg'$  Monday thru Friday. Saturday and Sunday '25mg'$  (half tablet)   losartan (COZAAR) 25 MG tablet Take 25 mg by mouth 2 (two) times daily. Morning and at 3:00pm   memantine (NAMENDA) 10 MG tablet Take 1 tablet (10 mg total) by mouth 2 (two) times daily.   omeprazole (PRILOSEC) 20 MG capsule Take 20 mg by mouth daily.   sertraline (ZOLOFT) 25 MG tablet Take 25 mg by mouth daily.   Sodium Fluoride (PREVIDENT 5000 BOOSTER PLUS) 1.1 % PSTE Place 1 application onto teeth at bedtime.   UNABLE TO FIND Med Name: Magic Cup daily at 12 pm   No facility-administered encounter medications on file as of 01/15/2022.    Review of Systems  Unable to perform ROS: Dementia   Immunization History  Administered Date(s) Administered   Influenza-Unspecified 04/22/2019, 06/06/2020, 06/12/2021   Moderna SARS-COV2 Booster Vaccination 07/13/2020, 01/22/2021   Moderna Sars-Covid-2 Vaccination 08/29/2019, 09/26/2019   PFIZER(Purple Top)SARS-COV-2 Vaccination 05/14/2021   Pfizer Covid-19 Vaccine Bivalent Booster 62yr & up 05/24/2021   Pneumococcal Conjugate-13 08/03/2013   Pneumococcal Polysaccharide-23 05/25/2006   Zoster Recombinat (Shingrix) 01/06/2018, 03/11/2018   Pertinent  Health Maintenance Due  Topic Date Due   DEXA SCAN  08/02/2022 (Originally 05/24/1996)   INFLUENZA VACCINE  03/25/2022      01/08/2017    3:42 PM 01/22/2018   10:45 AM 02/24/2020    8:37 AM  Fall Risk  Falls in the past year? No No   Patient Fall Risk Level   Low fall risk   Functional Status Survey:    Vitals:   01/15/22 1358  BP: 140/72  Pulse: 60  Resp: 18  Temp: 97.6 F (36.4 C)  SpO2: 97%  Weight: 95 lb 3.2 oz (43.2 kg)  Height: '4\' 11"'$  (1.499 m)   Body mass index is 19.23 kg/m. Physical Exam Constitutional:      General: She is not in acute distress.    Appearance: She is  underweight.  HENT:     Head: Normocephalic.     Right Ear: There is no impacted cerumen.     Left Ear: There is no impacted cerumen.     Nose: Nose normal.     Mouth/Throat:     Mouth: Mucous membranes are moist.  Eyes:     General:        Right eye: No discharge.        Left eye: No discharge.  Cardiovascular:     Rate and Rhythm: Normal rate and regular rhythm.     Pulses: Normal pulses.     Heart sounds: Normal heart sounds.  Pulmonary:     Effort: Pulmonary effort is normal.     Breath sounds: Normal breath sounds.  Abdominal:     General: Bowel sounds are  normal. There is no distension.     Tenderness: There is no abdominal tenderness.  Musculoskeletal:     Cervical back: Neck supple.     Right lower leg: No edema.     Left lower leg: No edema.  Skin:    General: Skin is warm and dry.     Capillary Refill: Capillary refill takes less than 2 seconds.  Neurological:     General: No focal deficit present.     Mental Status: She is alert. Mental status is at baseline.     Motor: Weakness present.     Gait: Gait abnormal.  Psychiatric:        Mood and Affect: Mood normal.        Behavior: Behavior normal.        Cognition and Memory: Cognition is impaired. Memory is impaired.     Comments: Aphasia, does not follow commands, alert to self    Labs reviewed: Recent Labs    02/28/21 0000 09/10/21 0000  NA 138 139  K 4.4 4.0  CL 104 106  CO2 25* 27*  BUN 20 30*  CREATININE 0.9 1.1  CALCIUM 8.6* 8.8   Recent Labs    02/28/21 0000 09/10/21 0000  AST 13 10*  ALT 10 9  ALKPHOS 75 74  ALBUMIN 3.8 3.6   Recent Labs    02/28/21 0000 09/10/21 0000  WBC 7.0 8.4  NEUTROABS 4,270.00 5,653.00  HGB 12.0 12.0  HCT 36 36  PLT 210 177   Lab Results  Component Value Date   TSH 0.75 02/28/2021   No results found for: HGBA1C No results found for: CHOL, HDL, LDLCALC, LDLDIRECT, TRIG, CHOLHDL  Significant Diagnostic Results in last 30 days:  No results  found.  Assessment/Plan 1. Severe Alzheimer's dementia without behavioral disturbance, psychotic disturbance, mood disturbance, or anxiety, unspecified timing of dementia onset ([place]) - no behavioral outbursts - abnormal gait - aphasia - cont Aricept and Namenda - cont skilled nursing care  2. Hypertension, unspecified type - controlled with losartan  3. Hypothyroidism, unspecified type - TSH stable - cont levothyroxine  4. DYSPHAGIA - no recent aspirations - cont bite sized meats  5. Gastroesophageal reflux disease without esophagitis - cont omeprazole  6. Depression due to dementia ([place]) - no mood changes - cont Zoloft  7. Age related osteoporosis, unspecified pathological fracture presence - DEXA 10/09/2021, t score -3.674 - cont fosamax and calcium/vit D supplement  8. Weight loss - BMI 19.23 - cont magic cups - cont monthly weights   Family/ staff Communication: plan discussed with patient and nurse  Labs/tests ordered:  none

## 2022-02-05 ENCOUNTER — Encounter: Payer: Self-pay | Admitting: Adult Health

## 2022-02-05 ENCOUNTER — Non-Acute Institutional Stay (SKILLED_NURSING_FACILITY): Payer: PPO | Admitting: Adult Health

## 2022-02-05 DIAGNOSIS — M810 Age-related osteoporosis without current pathological fracture: Secondary | ICD-10-CM

## 2022-02-05 DIAGNOSIS — F339 Major depressive disorder, recurrent, unspecified: Secondary | ICD-10-CM | POA: Diagnosis not present

## 2022-02-05 DIAGNOSIS — I10 Essential (primary) hypertension: Secondary | ICD-10-CM

## 2022-02-05 DIAGNOSIS — E039 Hypothyroidism, unspecified: Secondary | ICD-10-CM

## 2022-02-05 DIAGNOSIS — F02C0 Dementia in other diseases classified elsewhere, severe, without behavioral disturbance, psychotic disturbance, mood disturbance, and anxiety: Secondary | ICD-10-CM

## 2022-02-05 DIAGNOSIS — G309 Alzheimer's disease, unspecified: Secondary | ICD-10-CM

## 2022-02-05 NOTE — Progress Notes (Unsigned)
Location:   [facility] Room Number: 3 Place of Service:  SNF (31) Provider:  Medina-Vargas, Senaida Lange, NP   [name], MD  Patient Care Team: [name], MD as PCP - General (Internal Medicine)  Extended Emergency Contact Information Primary Emergency Contact: [facility] Address: [address]          [place], [place] [address] [place] Phone: [phone] Relation: Spouse Secondary Emergency Contact: Revia, Nghiem Mobile Phone: [phone] Relation: Daughter Interpreter needed? No  Code Status:  DNR Managed Care Goals of care: Advanced Directive information    02/05/2022    9:55 AM  Advanced Directives  Does Patient Have a Medical Advance Directive? Yes  Type of Paramedic of [place];Living will;Out of facility DNR (pink MOST or yellow form)  Does patient want to make changes to medical advance directive? No - Patient declined  Copy of [place] in Chart? Yes - validated most recent copy scanned in chart (See row information)  Pre-existing out of facility DNR order (yellow form or pink MOST form) Pink MOST form placed in chart (order not valid for inpatient use)     Chief Complaint  Patient presents with   Medical Management of Chronic Issues   Quality Metric Gaps    Verified matrix and [facility]IR patient is due for TDAP.     HPI:  Pt is a 88 y.o. female seen today for medical management of chronic diseases.     Past Medical History:  Diagnosis Date   Barrett's esophagus    Dementia ([place])    Diverticulosis    Esophageal stricture    GERD (gastroesophageal reflux disease)    History of colonic polyps    History of hysterectomy    Hypertension    Hypothyroidism    Internal hemorrhoids    Kidney stone    pt denies 03/17/14   Memory difficulty 10/17/2015   Primary progressive aphasia ([place]) 01/22/2018   Past Surgical History:  Procedure  Laterality Date   APPENDECTOMY     with hysterectomy   BREAST EXCISIONAL BIOPSY     right x 2 1962 and 1982   THYROIDECTOMY, PARTIAL     TONSILLECTOMY      Allergies  Allergen Reactions   Penicillins Nausea Only    Allergies as of 02/05/2022       Reactions   Penicillins Nausea Only        Medication List        Accurate as of February 05, 2022  9:56 AM. If you have any questions, ask your nurse or doctor.          acetaminophen 325 MG tablet Commonly known as: TYLENOL Take 650 mg by mouth every 6 (six) hours as needed.   alendronate 70 MG tablet Commonly known as: FOSAMAX Take 70 mg by mouth once a week. Once a day on Saturday.  Take with a full glass of water on an empty stomach.   atenolol 25 MG tablet Commonly known as: TENORMIN Take 25 mg by mouth daily.   Calcium 600+D3 Plus Minerals 600-800 MG-UNIT Tabs Take 1 tablet by mouth daily.   cholecalciferol 1000 units tablet Commonly known as: VITAMIN D Take 1,000 Units by mouth daily.   donepezil 5 MG tablet Commonly known as: ARICEPT Take 1 tablet (5 mg total) by mouth 2 (two) times daily.   levothyroxine  25 MCG tablet Commonly known as: SYNTHROID Take 25 mcg by mouth. Saturday and Sunday   levothyroxine 50 MCG tablet Commonly known as: SYNTHROID Take by mouth daily. '50mg'$  Monday thru Friday. Saturday and Sunday '25mg'$  (half tablet)   losartan 25 MG tablet Commonly known as: COZAAR Take 25 mg by mouth 2 (two) times daily. Morning and at 3:00pm   memantine 10 MG tablet Commonly known as: Namenda Take 1 tablet (10 mg total) by mouth 2 (two) times daily.   omeprazole 20 MG capsule Commonly known as: PRILOSEC Take 20 mg by mouth daily.   PreviDent 5000 Booster Plus 1.1 % Pste Generic drug: Sodium Fluoride Place 1 application onto teeth at bedtime.   sertraline 25 MG tablet Commonly known as: ZOLOFT Take 25 mg by mouth daily.   UNABLE TO FIND Med Name: Magic Cup daily at 12 pm         Review of Systems  Immunization History  Administered Date(s) Administered   Influenza-Unspecified 04/22/2019, 06/06/2020, 06/12/2021   Moderna SARS-COV2 Booster Vaccination 07/13/2020, 01/22/2021   Moderna Sars-Covid-2 Vaccination 08/29/2019, 09/26/2019   PFIZER(Purple Top)SARS-COV-2 Vaccination 05/14/2021   Pfizer Covid-19 Vaccine Bivalent Booster 60yr & up 05/24/2021   Pneumococcal Conjugate-13 08/03/2013   Pneumococcal Polysaccharide-23 05/25/2006   Zoster Recombinat (Shingrix) 01/06/2018, 03/11/2018   Pertinent  Health Maintenance Due  Topic Date Due   DEXA SCAN  08/02/2022 (Originally 05/24/1996)   INFLUENZA VACCINE  03/25/2022      01/08/2017    3:42 PM 01/22/2018   10:45 AM 02/24/2020    8:37 AM  Fall Risk  Falls in the past year? No No   Patient Fall Risk Level   Low fall risk   Functional Status Survey:    Vitals:   02/05/22 0949  BP: 112/87  Pulse: 65  Resp: 18  Temp: (!) 97.4 F (36.3 C)  SpO2: 97%  Weight: 95 lb 4.8 oz (43.2 kg)  Height: '4\' 11"'$  (1.499 m)   Body mass index is 19.25 kg/m. Physical Exam  Labs reviewed: Recent Labs    02/28/21 0000 09/10/21 0000  NA 138 139  K 4.4 4.0  CL 104 106  CO2 25* 27*  BUN 20 30*  CREATININE 0.9 1.1  CALCIUM 8.6* 8.8   Recent Labs    02/28/21 0000 09/10/21 0000  AST 13 10*  ALT 10 9  ALKPHOS 75 74  ALBUMIN 3.8 3.6   Recent Labs    02/28/21 0000 09/10/21 0000  WBC 7.0 8.4  NEUTROABS 4,270.00 5,653.00  HGB 12.0 12.0  HCT 36 36  PLT 210 177   Lab Results  Component Value Date   TSH 0.75 02/28/2021   No results found for: "HGBA1C" No results found for: "CHOL", "HDL", "LDLCALC", "LDLDIRECT", "TRIG", "CHOLHDL"  Significant Diagnostic Results in last 30 days:  No results found.  Assessment/Plan There are no diagnoses linked to this encounter.   Family/ staff Communication:   Labs/tests ordered:

## 2022-02-11 DIAGNOSIS — F330 Major depressive disorder, recurrent, mild: Secondary | ICD-10-CM | POA: Diagnosis not present

## 2022-02-11 DIAGNOSIS — G309 Alzheimer's disease, unspecified: Secondary | ICD-10-CM | POA: Diagnosis not present

## 2022-02-20 DIAGNOSIS — R2689 Other abnormalities of gait and mobility: Secondary | ICD-10-CM | POA: Diagnosis not present

## 2022-03-10 DIAGNOSIS — M810 Age-related osteoporosis without current pathological fracture: Secondary | ICD-10-CM | POA: Diagnosis not present

## 2022-03-10 DIAGNOSIS — H3530 Unspecified macular degeneration: Secondary | ICD-10-CM | POA: Diagnosis not present

## 2022-03-10 DIAGNOSIS — M6281 Muscle weakness (generalized): Secondary | ICD-10-CM | POA: Diagnosis not present

## 2022-03-10 DIAGNOSIS — R2681 Unsteadiness on feet: Secondary | ICD-10-CM | POA: Diagnosis not present

## 2022-03-13 DIAGNOSIS — E039 Hypothyroidism, unspecified: Secondary | ICD-10-CM | POA: Diagnosis not present

## 2022-03-14 LAB — TSH: TSH: 0.69 (ref 0.41–5.90)

## 2022-03-24 DIAGNOSIS — G309 Alzheimer's disease, unspecified: Secondary | ICD-10-CM | POA: Diagnosis not present

## 2022-03-24 DIAGNOSIS — F330 Major depressive disorder, recurrent, mild: Secondary | ICD-10-CM | POA: Diagnosis not present

## 2022-03-27 DIAGNOSIS — M810 Age-related osteoporosis without current pathological fracture: Secondary | ICD-10-CM | POA: Diagnosis not present

## 2022-03-27 DIAGNOSIS — M6281 Muscle weakness (generalized): Secondary | ICD-10-CM | POA: Diagnosis not present

## 2022-03-27 DIAGNOSIS — K219 Gastro-esophageal reflux disease without esophagitis: Secondary | ICD-10-CM | POA: Diagnosis not present

## 2022-03-27 DIAGNOSIS — R1312 Dysphagia, oropharyngeal phase: Secondary | ICD-10-CM | POA: Diagnosis not present

## 2022-03-27 DIAGNOSIS — F02C0 Dementia in other diseases classified elsewhere, severe, without behavioral disturbance, psychotic disturbance, mood disturbance, and anxiety: Secondary | ICD-10-CM | POA: Diagnosis not present

## 2022-03-27 DIAGNOSIS — R2681 Unsteadiness on feet: Secondary | ICD-10-CM | POA: Diagnosis not present

## 2022-03-27 DIAGNOSIS — H3530 Unspecified macular degeneration: Secondary | ICD-10-CM | POA: Diagnosis not present

## 2022-04-08 ENCOUNTER — Non-Acute Institutional Stay (SKILLED_NURSING_FACILITY): Payer: PPO | Admitting: Internal Medicine

## 2022-04-08 ENCOUNTER — Encounter: Payer: Self-pay | Admitting: Internal Medicine

## 2022-04-08 DIAGNOSIS — F0393 Unspecified dementia, unspecified severity, with mood disturbance: Secondary | ICD-10-CM | POA: Diagnosis not present

## 2022-04-08 DIAGNOSIS — M810 Age-related osteoporosis without current pathological fracture: Secondary | ICD-10-CM

## 2022-04-08 DIAGNOSIS — F339 Major depressive disorder, recurrent, unspecified: Secondary | ICD-10-CM

## 2022-04-08 DIAGNOSIS — F02C0 Dementia in other diseases classified elsewhere, severe, without behavioral disturbance, psychotic disturbance, mood disturbance, and anxiety: Secondary | ICD-10-CM

## 2022-04-08 DIAGNOSIS — E039 Hypothyroidism, unspecified: Secondary | ICD-10-CM | POA: Diagnosis not present

## 2022-04-08 DIAGNOSIS — G309 Alzheimer's disease, unspecified: Secondary | ICD-10-CM | POA: Diagnosis not present

## 2022-04-08 DIAGNOSIS — K219 Gastro-esophageal reflux disease without esophagitis: Secondary | ICD-10-CM

## 2022-04-08 DIAGNOSIS — I10 Essential (primary) hypertension: Secondary | ICD-10-CM

## 2022-04-08 NOTE — Progress Notes (Signed)
Location:   [facility] Room Number: 3 Place of Service:  SNF [id number]) Provider:  Veleta Miners MD   [name], MD  Patient Care Team: [name], MD as PCP - General (Internal Medicine)  Extended Emergency Contact Information Primary Emergency Contact: [facility] Address: [address]          [place], [place] [address] [place] Phone: [phone] Relation: Spouse Secondary Emergency Contact: Marisah, [place] Mobile Phone: [phone] Relation: Daughter Interpreter needed? No  Code Status:  DNR Managed Care Goals of care: Advanced Directive information    02/05/2022    9:55 AM  Advanced Directives  Does Patient Have a Medical Advance Directive? Yes  Type of Paramedic of [place];Living will;Out of facility DNR (pink MOST or yellow form)  Does patient want to make changes to medical advance directive? No - Patient declined  Copy of [place] in Chart? Yes - validated most recent copy scanned in chart (See row information)  Pre-existing out of facility DNR order (yellow form or pink MOST form) Pink MOST form placed in chart (order not valid for inpatient use)     Chief Complaint  Patient presents with   Medical Management of Chronic Issues   Quality Metric Gaps    TDAP and flu shot    HPI:  Pt is a 88 y.o. female seen today for medical management of chronic diseases.    Has history of hypertension, hypothyroidism, and GERD, left plantar basal cell cancer, Alzheimer's dementia with aphasia  T 5 Compression Fracture and L2 Compression Osteoporosis.  Depression H/o Pelvic fracture  She is stable. No new Nursing issues. No Behavior issues Her weight is stable Walks with mild assist No Falls Wt Readings from Last 3 Encounters:  04/08/22 96 lb 3.2 oz (43.6 kg)  02/05/22 95 lb 4.8 oz (43.2 kg)  01/15/22 95 lb 3.2 oz (43.2 kg)    Past Medical  History:  Diagnosis Date   Barrett's esophagus    Dementia ([place])    Diverticulosis    Esophageal stricture    GERD (gastroesophageal reflux disease)    History of colonic polyps    History of hysterectomy    Hypertension    Hypothyroidism    Internal hemorrhoids    Kidney stone    pt denies 03/17/14   Memory difficulty 10/17/2015   Primary progressive aphasia ([place]) 01/22/2018   Past Surgical History:  Procedure Laterality Date   APPENDECTOMY     with hysterectomy   BREAST EXCISIONAL BIOPSY     right x 2 1962 and 1982   THYROIDECTOMY, PARTIAL     TONSILLECTOMY      Allergies  Allergen Reactions   Penicillins Nausea Only    Allergies as of 04/08/2022       Reactions   Penicillins Nausea Only        Medication List        Accurate as of April 08, 2022  3:48 PM. If you have any questions, ask your nurse or doctor.          acetaminophen 325 MG tablet Commonly known as: TYLENOL Take 650 mg by mouth every 6 (six) hours as needed.   alendronate 70 MG tablet Commonly known as: FOSAMAX Take 70 mg by mouth once a week. Once a day on Saturday.  Take with a full glass of water  on an empty stomach.   atenolol 25 MG tablet Commonly known as: TENORMIN Take 25 mg by mouth daily.   Calcium 600+D3 Plus Minerals 600-800 MG-UNIT Tabs Take 1 tablet by mouth daily.   cholecalciferol 1000 units tablet Commonly known as: VITAMIN D Take 1,000 Units by mouth daily.   donepezil 5 MG tablet Commonly known as: ARICEPT Take 1 tablet (5 mg total) by mouth 2 (two) times daily.   levothyroxine 25 MCG tablet Commonly known as: SYNTHROID Take 25 mcg by mouth. Saturday and Sunday   levothyroxine 50 MCG tablet Commonly known as: SYNTHROID Take by mouth daily. '50mg'$  Monday thru Friday. Saturday and Sunday '25mg'$  (half tablet)   losartan 25 MG tablet Commonly known as: COZAAR Take 25 mg by mouth 2 (two) times daily. Morning and at 3:00pm   memantine 10 MG tablet Commonly  known as: Namenda Take 1 tablet (10 mg total) by mouth 2 (two) times daily.   omeprazole 20 MG capsule Commonly known as: PRILOSEC Take 20 mg by mouth daily.   PreviDent 5000 Booster Plus 1.1 % Pste Generic drug: Sodium Fluoride Place 1 application onto teeth at bedtime.   sertraline 25 MG tablet Commonly known as: ZOLOFT Take 25 mg by mouth daily.   UNABLE TO FIND Med Name: Magic Cup daily at 12 pm        Review of Systems  Unable to perform ROS: Dementia    Immunization History  Administered Date(s) Administered   Influenza-Unspecified 04/22/2019, 06/06/2020, 06/12/2021   Moderna SARS-COV2 Booster Vaccination 07/13/2020, 01/22/2021   Moderna Sars-Covid-2 Vaccination 08/29/2019, 09/26/2019   PFIZER(Purple Top)SARS-COV-2 Vaccination 05/14/2021   Pfizer Covid-19 Vaccine Bivalent Booster 16yr & up 05/24/2021   Pneumococcal Conjugate-13 08/03/2013   Pneumococcal Polysaccharide-23 05/25/2006   Zoster Recombinat (Shingrix) 01/06/2018, 03/11/2018   Pertinent  Health Maintenance Due  Topic Date Due   INFLUENZA VACCINE  03/25/2022   DEXA SCAN  08/02/2022 (Originally 05/24/1996)      01/08/2017    3:42 PM 01/22/2018   10:45 AM 02/24/2020    8:37 AM  Fall Risk  Falls in the past year? No No   Patient Fall Risk Level   Low fall risk   Functional Status Survey:    Vitals:   04/08/22 1537  BP: 132/74  Pulse: (!) 53  Resp: 18  Temp: 97.6 F (36.4 C)  SpO2: 93%  Weight: 96 lb 3.2 oz (43.6 kg)  Height: '4\' 11"'$  (1.499 m)   Body mass index is 19.43 kg/m. Physical Exam Vitals reviewed.  Constitutional:      Appearance: Normal appearance.  HENT:     Head: Normocephalic.     Nose: Nose normal.     Mouth/Throat:     Mouth: Mucous membranes are moist.     Pharynx: Oropharynx is clear.  Eyes:     Pupils: Pupils are equal, round, and reactive to light.  Cardiovascular:     Rate and Rhythm: Normal rate and regular rhythm.     Pulses: Normal pulses.     Heart sounds:  Normal heart sounds. No murmur heard. Pulmonary:     Effort: Pulmonary effort is normal.     Breath sounds: Normal breath sounds.  Abdominal:     General: Abdomen is flat. Bowel sounds are normal.     Palpations: Abdomen is soft.  Musculoskeletal:        General: No swelling.     Cervical back: Neck supple.  Skin:    General: Skin is warm.  Neurological:     General: No focal deficit present.     Mental Status: She is alert.  Psychiatric:        Mood and Affect: Mood normal.        Thought Content: Thought content normal.     Labs reviewed: Recent Labs    09/10/21 0000  NA 139  K 4.0  CL 106  CO2 27*  BUN 30*  CREATININE 1.1  CALCIUM 8.8   Recent Labs    09/10/21 0000  AST 10*  ALT 9  ALKPHOS 74  ALBUMIN 3.6   Recent Labs    09/10/21 0000  WBC 8.4  NEUTROABS 5,653.00  HGB 12.0  HCT 36  PLT 177   Lab Results  Component Value Date   TSH 0.69 03/14/2022   No results found for: "HGBA1C" No results found for: "CHOL", "HDL", "LDLCALC", "LDLDIRECT", "TRIG", "CHOLHDL"  Significant Diagnostic Results in last 30 days:  No results found.  Assessment/Plan 1. Hypothyroidism, unspecified type TSH normal in 7/23  2. Hypertension, unspecified type Cozaar  3. Major depression, recurrent, chronic (HCC) Zoloft Failed GDR  4. Age related osteoporosis, unspecified pathological fracture presence Fosamax since 02/23 T score -3.6  5. Severe Alzheimer's dementia without behavioral disturbance, psychotic disturbance, mood disturbance, or anxiety, unspecified timing of dementia onset ([place]) On Aricept and Namenda  6. Gastroesophageal reflux disease without esophagitis On Prilosec      Family/ staff Communication:   Labs/tests ordered:

## 2022-04-16 ENCOUNTER — Encounter: Payer: Self-pay | Admitting: Adult Health

## 2022-04-16 ENCOUNTER — Non-Acute Institutional Stay (SKILLED_NURSING_FACILITY): Payer: PPO | Admitting: Adult Health

## 2022-04-16 DIAGNOSIS — I10 Essential (primary) hypertension: Secondary | ICD-10-CM

## 2022-04-16 DIAGNOSIS — F02C0 Dementia in other diseases classified elsewhere, severe, without behavioral disturbance, psychotic disturbance, mood disturbance, and anxiety: Secondary | ICD-10-CM

## 2022-04-16 DIAGNOSIS — G309 Alzheimer's disease, unspecified: Secondary | ICD-10-CM | POA: Diagnosis not present

## 2022-04-16 DIAGNOSIS — K219 Gastro-esophageal reflux disease without esophagitis: Secondary | ICD-10-CM

## 2022-04-16 NOTE — Progress Notes (Signed)
Location:  [place] Room Number: 3 A Place of Service:  SNF (31) Provider:  Durenda Age, DNP, FNP-BC  Patient Care Team: [name], MD as PCP - General (Internal Medicine)  Extended Emergency Contact Information Primary Emergency Contact: [facility] Address: [address]          [place], [place] [address] [place] Phone: [phone] Relation: Spouse Secondary Emergency Contact: Nyiah, Pianka Mobile Phone: [phone] Relation: Daughter Interpreter needed? No  Code Status:   DNR  Goals of care: Advanced Directive information    02/05/2022    9:55 AM  Advanced Directives  Does Patient Have a Medical Advance Directive? Yes  Type of Paramedic of [place];Living will;Out of facility DNR (pink MOST or yellow form)  Does patient want to make changes to medical advance directive? No - Patient declined  Copy of [place] in Chart? Yes - validated most recent copy scanned in chart (See row information)  Pre-existing out of facility DNR order (yellow form or pink MOST form) Pink MOST form placed in chart (order not valid for inpatient use)     Chief Complaint  Patient presents with   Acute Visit    Reflux symptoms    HPI:  Pt is a 88 y.o. female seen today for an acute visit regarding reflux symptoms.  Speech therapist reported that she was noted to be clearing her throat with a variety of consistencies. She has possible premature fullness with limited food intake.  Husband reported that resident has been on Omeprazole for >5 years. Husband was visiting today.  BP 122/72, SBPs ranging from  110 to 132, she takes Losartan 25 mg BID and Tenormin 25 mg daily for hypertension.  Latest BIMS score 0/15. She takes Aricept 5 mg BID and Memantine 10 mg BID for dementia   Past Medical History:  Diagnosis Date   Barrett's esophagus    Dementia ([place])     Diverticulosis    Esophageal stricture    GERD (gastroesophageal reflux disease)    History of colonic polyps    History of hysterectomy    Hypertension    Hypothyroidism    Internal hemorrhoids    Kidney stone    pt denies 03/17/14   Memory difficulty 10/17/2015   Primary progressive aphasia ([place]) 01/22/2018   Past Surgical History:  Procedure Laterality Date   APPENDECTOMY     with hysterectomy   BREAST EXCISIONAL BIOPSY     right x 2 1962 and 1982   THYROIDECTOMY, PARTIAL     TONSILLECTOMY      Allergies  Allergen Reactions   Penicillins Nausea Only    Outpatient Encounter Medications as of 04/16/2022  Medication Sig   acetaminophen (TYLENOL) 325 MG tablet Take 650 mg by mouth every 6 (six) hours as needed.   alendronate (FOSAMAX) 70 MG tablet Take 70 mg by mouth once a week. Once a day on Saturday.  Take with a full glass of water on an empty stomach.   atenolol (TENORMIN) 25 MG tablet Take 25 mg by mouth daily.    Calcium Carbonate-Vit D-Min (CALCIUM 600+D3 PLUS MINERALS) 600-800 MG-UNIT TABS Take 1 tablet by mouth daily.   cholecalciferol (VITAMIN D) 1000 UNITS tablet Take 1,000 Units by mouth daily.    donepezil (ARICEPT) 5 MG tablet Take 1 tablet (5 mg total) by mouth 2 (two) times daily.  levothyroxine (SYNTHROID) 25 MCG tablet Take 25 mcg by mouth. Saturday and Sunday   levothyroxine (SYNTHROID, LEVOTHROID) 50 MCG tablet Take by mouth daily. '50mg'$  Monday thru Friday. Saturday and Sunday '25mg'$  (half tablet)   losartan (COZAAR) 25 MG tablet Take 25 mg by mouth 2 (two) times daily. Morning and at 3:00pm   memantine (NAMENDA) 10 MG tablet Take 1 tablet (10 mg total) by mouth 2 (two) times daily.   omeprazole (PRILOSEC) 20 MG capsule Take 20 mg by mouth daily.   sertraline (ZOLOFT) 25 MG tablet Take 25 mg by mouth daily.   Sodium Fluoride (PREVIDENT 5000 BOOSTER PLUS) 1.1 % PSTE Place 1 application onto teeth at bedtime.   UNABLE TO FIND Med Name: Magic Cup daily at 12  pm   No facility-administered encounter medications on file as of 04/16/2022.    Review of Systems  Unable to obtain due to dementia.    Immunization History  Administered Date(s) Administered   Influenza-Unspecified 04/22/2019, 06/06/2020, 06/12/2021   Moderna SARS-COV2 Booster Vaccination 07/13/2020, 01/22/2021   Moderna Sars-Covid-2 Vaccination 08/29/2019, 09/26/2019   PFIZER(Purple Top)SARS-COV-2 Vaccination 05/14/2021   Pfizer Covid-19 Vaccine Bivalent Booster 93yr & up 05/24/2021   Pneumococcal Conjugate-13 08/03/2013   Pneumococcal Polysaccharide-23 05/25/2006   Zoster Recombinat (Shingrix) 01/06/2018, 03/11/2018   Pertinent  Health Maintenance Due  Topic Date Due   INFLUENZA VACCINE  03/25/2022   DEXA SCAN  08/02/2022 (Originally 05/24/1996)      01/08/2017    3:42 PM 01/22/2018   10:45 AM 02/24/2020    8:37 AM  Fall Risk  Falls in the past year? No No   Patient Fall Risk Level   Low fall risk     Vitals:   04/16/22 1154  BP: 122/77  Pulse: 88  Resp: 18  Temp: 98.1 F (36.7 C)  Weight: 96 lb 3.2 oz (43.6 kg)  Height: '4\' 11"'$  (1.499 m)   Body mass index is 19.43 kg/m.  Physical Exam Constitutional:      General: She is not in acute distress. HENT:     Head: Normocephalic and atraumatic.     Nose: Nose normal.     Mouth/Throat:     Mouth: Mucous membranes are moist.  Eyes:     Conjunctiva/sclera: Conjunctivae normal.  Cardiovascular:     Rate and Rhythm: Normal rate and regular rhythm.  Pulmonary:     Effort: Pulmonary effort is normal.     Breath sounds: Normal breath sounds.  Abdominal:     General: Bowel sounds are normal.     Palpations: Abdomen is soft.  Musculoskeletal:        General: Normal range of motion.     Cervical back: Normal range of motion.  Skin:    General: Skin is warm and dry.  Neurological:     Mental Status: Mental status is at baseline. She is disoriented.  Psychiatric:        Mood and Affect: Mood normal.         Behavior: Behavior normal.        Labs reviewed: Recent Labs    09/10/21 0000  NA 139  K 4.0  CL 106  CO2 27*  BUN 30*  CREATININE 1.1  CALCIUM 8.8   Recent Labs    09/10/21 0000  AST 10*  ALT 9  ALKPHOS 74  ALBUMIN 3.6   Recent Labs    09/10/21 0000  WBC 8.4  NEUTROABS 5,653.00  HGB 12.0  HCT 36  PLT  177   Lab Results  Component Value Date   TSH 0.69 03/14/2022   No results found for: "HGBA1C" No results found for: "CHOL", "HDL", "LDLCALC", "LDLDIRECT", "TRIG", "CHOLHDL"  Significant Diagnostic Results in last 30 days:  No results found.  Assessment/Plan  1. Gastroesophageal reflux disease without esophagitis -  will discontinue Omeprazole and start on Protonix - pantoprazole (PROTONIX) 20 MG tablet; Take 1 tablet (20 mg total) by mouth daily.  Dispense: 30 tablet; Refill: 6  2. Hypertension, unspecified type -Blood pressure well controlled Continue current medications  3. Severe Alzheimer's dementia without behavioral disturbance, psychotic disturbance, mood disturbance, or anxiety, unspecified timing of dementia onset ([place]) -   has severe cognitive impairment -  continue Aricept and Memantine -  continue supportive care    Family/ staff Communication: Discussed plan of care with husband and charge nurse.  Labs/tests ordered:  None    Durenda Age, DNP, MSN, FNP-BC [facility] [phone] (Monday-Friday 8:00 a.m. - 5:00 p.m.) [phone] (after hours)

## 2022-04-18 MED ORDER — PANTOPRAZOLE SODIUM 20 MG PO TBEC: 20.0000 mg | DELAYED_RELEASE_TABLET | Freq: Every day | ORAL | 6 refills | Status: AC

## 2022-04-21 DIAGNOSIS — G309 Alzheimer's disease, unspecified: Secondary | ICD-10-CM | POA: Diagnosis not present

## 2022-04-21 DIAGNOSIS — F330 Major depressive disorder, recurrent, mild: Secondary | ICD-10-CM | POA: Diagnosis not present

## 2022-04-25 ENCOUNTER — Encounter: Payer: Self-pay | Admitting: Nurse Practitioner

## 2022-04-25 ENCOUNTER — Non-Acute Institutional Stay (SKILLED_NURSING_FACILITY): Payer: PPO | Admitting: Nurse Practitioner

## 2022-04-25 DIAGNOSIS — I10 Essential (primary) hypertension: Secondary | ICD-10-CM

## 2022-04-25 DIAGNOSIS — M810 Age-related osteoporosis without current pathological fracture: Secondary | ICD-10-CM

## 2022-04-25 DIAGNOSIS — F0393 Unspecified dementia, unspecified severity, with mood disturbance: Secondary | ICD-10-CM | POA: Diagnosis not present

## 2022-04-25 DIAGNOSIS — K219 Gastro-esophageal reflux disease without esophagitis: Secondary | ICD-10-CM | POA: Diagnosis not present

## 2022-04-25 DIAGNOSIS — G309 Alzheimer's disease, unspecified: Secondary | ICD-10-CM

## 2022-04-25 DIAGNOSIS — M159 Polyosteoarthritis, unspecified: Secondary | ICD-10-CM | POA: Diagnosis not present

## 2022-04-25 DIAGNOSIS — F02C0 Dementia in other diseases classified elsewhere, severe, without behavioral disturbance, psychotic disturbance, mood disturbance, and anxiety: Secondary | ICD-10-CM | POA: Diagnosis not present

## 2022-04-25 DIAGNOSIS — E559 Vitamin D deficiency, unspecified: Secondary | ICD-10-CM | POA: Diagnosis not present

## 2022-04-25 DIAGNOSIS — E039 Hypothyroidism, unspecified: Secondary | ICD-10-CM

## 2022-04-25 DIAGNOSIS — R1312 Dysphagia, oropharyngeal phase: Secondary | ICD-10-CM | POA: Diagnosis not present

## 2022-04-25 NOTE — Assessment & Plan Note (Signed)
10/09/21 DEXA t score -3.674, Ca '800mg'$  qd, Fosamx weekly

## 2022-04-25 NOTE — Progress Notes (Signed)
Location:   [place] [facility] Room Number: 3 A Place of Service:  SNF (31) Provider:  Rashell Shambaugh X, NP  [name], MD  Patient Care Team: [name], MD as PCP - General (Internal Medicine)  Extended Emergency Contact Information Primary Emergency Contact: [facility] Address: [address]          [place], [place] [address] [place] Phone: [phone] Relation: Spouse Secondary Emergency Contact: Greysen, [place] Mobile Phone: [phone] Relation: Daughter Interpreter needed? No  Code Status:  DNR Goals of care: Advanced Directive information    04/25/2022   10:32 AM  Advanced Directives  Does Patient Have a Medical Advance Directive? Yes  Type of Paramedic of [place];Living will;Out of facility DNR (pink MOST or yellow form)  Does patient want to make changes to medical advance directive? No - Patient declined  Copy of [place] in Chart? Yes - validated most recent copy scanned in chart (See row information)  Pre-existing out of facility DNR order (yellow form or pink MOST form) Pink MOST form placed in chart (order not valid for inpatient use)     Chief Complaint  Patient presents with  . Medical Management of Chronic Issues    Routine follow up  . Immunizations    Tetanus/tdap, flu vaccine due    HPI:  Pt is a 88 y.o. female seen today for medical management of chronic diseases.     Healed Pelvic fracture, CT . Acute nondisplaced fracture of the right inferior pubic ramus and suspected subtle acute nondisplaced fracture in the right sacrum.   Compression fracture of body of thoracic vertera, pain is controlled on Tylenol  Gait abnormality, ambulates with walker, w/c to go further.              Dementia, takes Donepezil, Memantine, seen neurology             Depression, mood is stabilized, takes Sertraline. Failed  Psych recommended GDR              HTN, takes Atenolol, Losartan, Bun/creat 30/1.1 09/10/21             Vit D deficiency, takes Vit D             Hypothyroidism, takes Levothyroxine, TSH 0.69 03/14/22             GERD, takes Omeprazole. Hgb 12.0 09/10/21             Left foot plantar skin basal cell carcinoma, saw Dermatology, s/p Mohs surgery, healed             OP: 10/09/21 DEXA t score -3.674, Ca '800mg'$  qd, Fosamx weekly    Past Medical History:  Diagnosis Date  . Barrett's esophagus   . Dementia ([place])   . Diverticulosis   . Esophageal stricture   . GERD (gastroesophageal reflux disease)   . History of colonic polyps   . History of hysterectomy   . Hypertension   . Hypothyroidism   . Internal hemorrhoids   . Kidney stone    pt denies 03/17/14  . Memory difficulty 10/17/2015  . Primary progressive aphasia ([place]) 01/22/2018   Past Surgical History:  Procedure Laterality Date  . APPENDECTOMY     with hysterectomy  . BREAST EXCISIONAL BIOPSY     right x 2 1962 and 1982  . THYROIDECTOMY, PARTIAL    .  TONSILLECTOMY      Allergies  Allergen Reactions  . Penicillins Nausea Only    Allergies as of 04/25/2022       Reactions   Penicillins Nausea Only        Medication List        Accurate as of April 25, 2022 11:59 PM. If you have any questions, ask your nurse or doctor.          STOP taking these medications    UNABLE TO FIND Stopped by: Jazmaine Fuelling X Celes Dedic, NP       TAKE these medications    acetaminophen 325 MG tablet Commonly known as: TYLENOL Take 650 mg by mouth every 6 (six) hours as needed.   alendronate 70 MG tablet Commonly known as: FOSAMAX Take 70 mg by mouth once a week. Once a day on Saturday.  Take with a full glass of water on an empty stomach.   atenolol 25 MG tablet Commonly known as: TENORMIN Take 25 mg by mouth daily.   Calcium 600+D3 Plus Minerals 600-800 MG-UNIT Tabs Take 1 tablet by mouth daily.   cholecalciferol 1000 units tablet Commonly known as: VITAMIN  D Take 1,000 Units by mouth daily.   donepezil 5 MG tablet Commonly known as: ARICEPT Take 1 tablet (5 mg total) by mouth 2 (two) times daily.   levothyroxine 25 MCG tablet Commonly known as: SYNTHROID Take 25 mcg by mouth. Saturday and Sunday   levothyroxine 50 MCG tablet Commonly known as: SYNTHROID Take by mouth daily. '50mg'$  Monday thru Friday. Saturday and Sunday '25mg'$  (half tablet)   losartan 25 MG tablet Commonly known as: COZAAR Take 25 mg by mouth 2 (two) times daily. Morning and at 3:00pm   memantine 10 MG tablet Commonly known as: Namenda Take 1 tablet (10 mg total) by mouth 2 (two) times daily.   pantoprazole 20 MG tablet Commonly known as: Protonix Take 1 tablet (20 mg total) by mouth daily.   PreviDent 5000 Booster Plus 1.1 % Pste Generic drug: Sodium Fluoride Place 1 application onto teeth at bedtime.   sertraline 25 MG tablet Commonly known as: ZOLOFT Take 25 mg by mouth daily.        Review of Systems  Unable to perform ROS: Dementia    Immunization History  Administered Date(s) Administered  . Influenza-Unspecified 04/22/2019, 06/06/2020, 06/12/2021  . Moderna Covid-19 Vaccine Bivalent Booster 4yr & up 01/10/2022  . Moderna SARS-COV2 Booster Vaccination 07/13/2020, 01/22/2021  . Moderna Sars-Covid-2 Vaccination 08/29/2019, 09/26/2019  . PFIZER(Purple Top)SARS-COV-2 Vaccination 05/14/2021  . PPension scheme manager134yr& up 05/24/2021  . Pneumococcal Conjugate-13 08/03/2013  . Pneumococcal Polysaccharide-23 05/25/2006  . Zoster Recombinat (Shingrix) 01/06/2018, 03/11/2018   Pertinent  Health Maintenance Due  Topic Date Due  . INFLUENZA VACCINE  03/25/2022  . DEXA SCAN  08/02/2022 (Originally 05/24/1996)      01/08/2017    3:42 PM 01/22/2018   10:45 AM 02/24/2020    8:37 AM  Fall Risk  Falls in the past year? No No   Patient Fall Risk Level   Low fall risk   Functional Status Survey:    Vitals:   04/25/22 1018   BP: 120/60  Pulse: (!) 55  Resp: 18  Temp: (!) 97.5 F (36.4 C)  SpO2: 97%  Weight: 96 lb 3.2 oz (43.6 kg)  Height: '4\' 11"'$  (1.499 m)   Body mass index is 19.43 kg/m. Physical Exam Vitals reviewed.  Constitutional:      Appearance:  Normal appearance.  HENT:     Head: Normocephalic and atraumatic.     Mouth/Throat:     Mouth: Mucous membranes are moist.  Eyes:     Conjunctiva/sclera: Conjunctivae normal.     Pupils: Pupils are equal, round, and reactive to light.  Cardiovascular:     Rate and Rhythm: Normal rate and regular rhythm.     Heart sounds: No murmur heard. Pulmonary:     Effort: Pulmonary effort is normal.     Breath sounds: No rales.  Abdominal:     General: Bowel sounds are normal.     Palpations: Abdomen is soft.     Tenderness: There is no abdominal tenderness.  Musculoskeletal:     Cervical back: Normal range of motion and neck supple.     Right lower leg: No edema.     Left lower leg: No edema.     Comments: Ambulates with walker, w/c to go further   Skin:    General: Skin is warm and dry.     Comments: Left midfoot plantar aspect skin lesion(hx of BCC) is healed  Neurological:     General: No focal deficit present.     Mental Status: She is alert. Mental status is at baseline.     Gait: Gait abnormal.     Comments: Oriented to person  Psychiatric:        Mood and Affect: Mood normal.    Labs reviewed: Recent Labs    09/10/21 0000  NA 139  K 4.0  CL 106  CO2 27*  BUN 30*  CREATININE 1.1  CALCIUM 8.8   Recent Labs    09/10/21 0000  AST 10*  ALT 9  ALKPHOS 74  ALBUMIN 3.6   Recent Labs    09/10/21 0000  WBC 8.4  NEUTROABS 5,653.00  HGB 12.0  HCT 36  PLT 177   Lab Results  Component Value Date   TSH 0.69 03/14/2022   No results found for: "HGBA1C" No results found for: "CHOL", "HDL", "LDLCALC", "LDLDIRECT", "TRIG", "CHOLHDL"  Significant Diagnostic Results in last 30 days:  No results  found.  Assessment/Plan Osteoarthritis, multiple sites Healed Pelvic fracture, CT . Acute nondisplaced fracture of the right inferior pubic ramus and suspected subtle acute nondisplaced fracture in the right sacrum.   Compression fracture of body of thoracic vertera, pain is controlled on Tylenol  Gait abnormality, ambulates with walker, w/c to go further.   Alzheimer's dementia ([place]) takes Donepezil, Memantine, seen neurology  Depression due to dementia [facility]) mood is stabilized, takes Sertraline. Failed  Psych recommended GDR  HTN (hypertension) Blood pressure is controlled, decrease Atenolol to 12.'5mg'$  qd 2/2 HR 55bpm, continue Losartan, Bun/creat 30/1.1 09/10/21  Vitamin D deficiency akes Vit D  Hypothyroidism takes Levothyroxine, TSH 0.69 03/14/22  GERD Stable, takes Omeprazole. Hgb 12.0 09/10/21  Osteoporosis 10/09/21 DEXA t score -3.674, Ca '800mg'$  qd, Fosamx weekly     Family/ staff Communication: plan of care reviewed with the patient and charge nurse.   Labs/tests ordered:  none  Time spend 35 minutes.

## 2022-04-25 NOTE — Assessment & Plan Note (Signed)
Stable, takes Omeprazole. Hgb 12.0 09/10/21

## 2022-04-25 NOTE — Assessment & Plan Note (Signed)
takes Levothyroxine, TSH 0.69 03/14/22

## 2022-04-25 NOTE — Assessment & Plan Note (Signed)
Healed Pelvic fracture, CT . Acute nondisplaced fracture of the right inferior pubic ramus and suspected subtle acute nondisplaced fracture in the right sacrum.   Compression fracture of body of thoracic vertera, pain is controlled on Tylenol  Gait abnormality, ambulates with walker, w/c to go further.

## 2022-04-25 NOTE — Assessment & Plan Note (Signed)
takes Donepezil, Memantine, seen neurology

## 2022-04-25 NOTE — Assessment & Plan Note (Signed)
akes Vit D

## 2022-04-25 NOTE — Assessment & Plan Note (Signed)
mood is stabilized, takes Sertraline. Failed  Psych recommended GDR

## 2022-04-25 NOTE — Assessment & Plan Note (Addendum)
Blood pressure is controlled, decrease Atenolol to 12.'5mg'$  qd 2/2 HR 55bpm, continue Losartan, Bun/creat 30/1.1 09/10/21

## 2022-05-05 ENCOUNTER — Encounter: Payer: Self-pay | Admitting: Nurse Practitioner

## 2022-05-20 DIAGNOSIS — F330 Major depressive disorder, recurrent, mild: Secondary | ICD-10-CM | POA: Diagnosis not present

## 2022-05-20 DIAGNOSIS — G309 Alzheimer's disease, unspecified: Secondary | ICD-10-CM | POA: Diagnosis not present

## 2022-05-26 ENCOUNTER — Non-Acute Institutional Stay (SKILLED_NURSING_FACILITY): Payer: PPO | Admitting: Nurse Practitioner

## 2022-05-26 ENCOUNTER — Encounter: Payer: Self-pay | Admitting: Nurse Practitioner

## 2022-05-26 DIAGNOSIS — M150 Primary generalized (osteo)arthritis: Secondary | ICD-10-CM

## 2022-05-26 DIAGNOSIS — M810 Age-related osteoporosis without current pathological fracture: Secondary | ICD-10-CM

## 2022-05-26 DIAGNOSIS — E559 Vitamin D deficiency, unspecified: Secondary | ICD-10-CM | POA: Diagnosis not present

## 2022-05-26 DIAGNOSIS — F02C0 Dementia in other diseases classified elsewhere, severe, without behavioral disturbance, psychotic disturbance, mood disturbance, and anxiety: Secondary | ICD-10-CM

## 2022-05-26 DIAGNOSIS — G309 Alzheimer's disease, unspecified: Secondary | ICD-10-CM

## 2022-05-26 DIAGNOSIS — M159 Polyosteoarthritis, unspecified: Secondary | ICD-10-CM | POA: Diagnosis not present

## 2022-05-26 DIAGNOSIS — F0393 Unspecified dementia, unspecified severity, with mood disturbance: Secondary | ICD-10-CM

## 2022-05-26 DIAGNOSIS — R634 Abnormal weight loss: Secondary | ICD-10-CM

## 2022-05-26 DIAGNOSIS — I10 Essential (primary) hypertension: Secondary | ICD-10-CM

## 2022-05-26 DIAGNOSIS — E039 Hypothyroidism, unspecified: Secondary | ICD-10-CM | POA: Diagnosis not present

## 2022-05-26 DIAGNOSIS — R269 Unspecified abnormalities of gait and mobility: Secondary | ICD-10-CM

## 2022-05-26 DIAGNOSIS — K219 Gastro-esophageal reflux disease without esophagitis: Secondary | ICD-10-CM | POA: Diagnosis not present

## 2022-05-26 NOTE — Assessment & Plan Note (Signed)
ambulates with walker, w/c to go further 

## 2022-05-26 NOTE — Assessment & Plan Note (Signed)
Hx of pelvic fx-R inferior pubic ramus/sacrum, compression fx of T spine, uses Tylenol, ambulates with walker.

## 2022-05-26 NOTE — Progress Notes (Signed)
Location:   [place] Room Number: 03/A Place of Service:  SNF [phone]) Provider: [facility] Urho Rio NP  [name], MD  Patient Care Team: [name], MD as PCP - General (Internal Medicine)  Extended Emergency Contact Information Primary Emergency Contact: [facility] Address: [address]          [place], [place] [address] Johnnette Litter of [place] Phone: [phone] Relation: Spouse Secondary Emergency Contact: Braylee, Lal Mobile Phone: [phone] Relation: Daughter Interpreter needed? No  Code Status:  DNR  Managed Care Goals of care: Advanced Directive information    05/26/2022    3:22 PM  Advanced Directives  Does Patient Have a Medical Advance Directive? Yes  Type of Paramedic of [place];Out of facility DNR (pink MOST or yellow form)  Copy of [place] in Chart? Yes - validated most recent copy scanned in chart (See row information)  Pre-existing out of facility DNR order (yellow form or pink MOST form) Pink Most/Yellow Form available - Physician notified to receive inpatient order     Chief Complaint  Patient presents with   Medical Management of Chronic Issues    [place]    HPI:  Pt is a 88 y.o. female seen today for medical management of chronic diseases.      Gait abnormality, ambulates with walker, w/c to go further.              Dementia, takes Donepezil, Memantine, seen neurology             Depression, mood is stabilized, takes Sertraline. Failed  Psych recommended GDR  OA Hx of pelvic fx-R inferior pubic ramus/sacrum, compression fx of T spine, uses Tylenol, ambulates with walker.              HTN, takes Atenolol, Losartan, Bun/creat 30/1.1 09/10/21             Vit D deficiency, takes Vit D             Hypothyroidism, takes Levothyroxine, TSH 0.69 03/14/22             GERD, takes Omeprazole. Hgb 12.0 09/10/21              Left foot plantar skin basal cell carcinoma, saw Dermatology, s/p Mohs surgery, healed             OP: 10/09/21 DEXA t score -3.674, Ca 882m qd, Fosamx weekly    Past Medical History:  Diagnosis Date   Barrett's esophagus    Dementia ([place]    Diverticulosis    Esophageal stricture    GERD (gastroesophageal reflux disease)    History of colonic polyps    History of hysterectomy    Hypertension    Hypothyroidism    Internal hemorrhoids    Kidney stone    pt denies 03/17/14   Memory difficulty 10/17/2015   [place] 01/22/2018   Past Surgical History:  Procedure Laterality Date   APPENDECTOMY     with hysterectomy   BREAST EXCISIONAL BIOPSY     right x 2 1962 and 1982   THYROIDECTOMY, PARTIAL     TONSILLECTOMY      Allergies  Allergen Reactions   Penicillins Nausea Only    Allergies as of 05/26/2022       Reactions   Penicillins Nausea Only  Medication List        Accurate as of May 26, 2022 11:59 PM. If you have any questions, ask your nurse or doctor.          acetaminophen 325 MG tablet Commonly known as: TYLENOL Take 650 mg by mouth every 6 (six) hours as needed.   alendronate 70 MG tablet Commonly known as: FOSAMAX Take 70 mg by mouth once a week. Once a day on Saturday.  Take with a full glass of water on an empty stomach.   atenolol 25 MG tablet Commonly known as: TENORMIN Take 25 mg by mouth daily.   Calcium 600+D3 Plus Minerals 600-800 MG-UNIT Tabs Take 1 tablet by mouth daily.   cholecalciferol 1000 units tablet Commonly known as: VITAMIN D Take 1,000 Units by mouth daily.   donepezil 5 MG tablet Commonly known as: ARICEPT Take 1 tablet (5 mg total) by mouth 2 (two) times daily.   levothyroxine 25 MCG tablet Commonly known as: SYNTHROID Take 25 mcg by mouth. Saturday and Sunday   levothyroxine 50 MCG tablet Commonly known as: SYNTHROID Take by mouth daily. 28m Monday thru Friday. Saturday and  Sunday 232m(half tablet)   losartan 25 MG tablet Commonly known as: COZAAR Take 25 mg by mouth 2 (two) times daily. Morning and at 3:00pm   memantine 10 MG tablet Commonly known as: Namenda Take 1 tablet (10 mg total) by mouth 2 (two) times daily.   pantoprazole 20 MG tablet Commonly known as: Protonix Take 1 tablet (20 mg total) by mouth daily.   PreviDent 5000 Booster Plus 1.1 % Pste Generic drug: Sodium Fluoride Place 1 application onto teeth at bedtime.   sertraline 25 MG tablet Commonly known as: ZOLOFT Take 25 mg by mouth daily.        Review of Systems  Unable to perform ROS: Dementia    Immunization History  Administered Date(s) Administered   Influenza-Unspecified 04/22/2019, 06/06/2020, 06/12/2021   Moderna Covid-19 Vaccine Bivalent Booster 1813yr up 01/10/2022   Moderna SARS-COV2 Booster Vaccination 07/13/2020, 01/22/2021   Moderna Sars-Covid-2 Vaccination 08/29/2019, 09/26/2019   PFIZER(Purple Top)SARS-COV-2 Vaccination 05/14/2021   Pfizer Covid-19 Vaccine Bivalent Booster 12y70yrup 05/24/2021   Pneumococcal Conjugate-13 08/03/2013   Pneumococcal Polysaccharide-23 05/25/2006   Zoster Recombinat (Shingrix) 01/06/2018, 03/11/2018   Pertinent  Health Maintenance Due  Topic Date Due   INFLUENZA VACCINE  03/25/2022   DEXA SCAN  08/02/2022 (Originally 05/24/1996)      01/08/2017    3:42 PM 01/22/2018   10:45 AM 02/24/2020    8:37 AM  Fall Risk  Falls in the past year? No No   Patient Fall Risk Level   Low fall risk   Functional Status Survey:    Vitals:   05/26/22 1513  BP: 111/79  Pulse: 75  Resp: 17  Temp: (!) 97.5 F (36.4 C)  SpO2: 95%  Weight: 85 lb 3.2 oz (38.6 kg)  Height: _0  (1.499 m)   Body mass index is 17.21 kg/m. Physical Exam Vitals reviewed.  Constitutional:      Appearance: Normal appearance.     Comments: #11 Ibs weight loss in the past month  HENT:     Head: Normocephalic and atraumatic.     Mouth/Throat:      Mouth: Mucous membranes are moist.  Eyes:     Conjunctiva/sclera: Conjunctivae normal.     Pupils: Pupils are equal, round, and reactive to light.  Cardiovascular:     Rate and Rhythm:  Normal rate and regular rhythm.     Heart sounds: No murmur heard. Pulmonary:     Effort: Pulmonary effort is normal.     Breath sounds: No rales.  Abdominal:     General: Bowel sounds are normal.     Palpations: Abdomen is soft.     Tenderness: There is no abdominal tenderness.  Musculoskeletal:     Cervical back: Normal range of motion and neck supple.     Right lower leg: No edema.     Left lower leg: No edema.     Comments: Ambulates with walker, w/c to go further   Skin:    General: Skin is warm and dry.     Comments: Left midfoot plantar aspect skin lesion(hx of BCC) is healed  Neurological:     General: No focal deficit present.     Mental Status: She is alert. Mental status is at baseline.     Gait: Gait abnormal.     Comments: Oriented to person  Psychiatric:        Mood and Affect: Mood normal.     Labs reviewed: Recent Labs    09/10/21 0000  NA 139  K 4.0  CL 106  CO2 27*  BUN 30*  CREATININE 1.1  CALCIUM 8.8   Recent Labs    09/10/21 0000  AST 10*  ALT 9  ALKPHOS 74  ALBUMIN 3.6   Recent Labs    09/10/21 0000  WBC 8.4  NEUTROABS 5,653.00  HGB 12.0  HCT 36  PLT 177   Lab Results  Component Value Date   TSH 0.69 03/14/2022   No results found for: "HGBA1C" No results found for: "CHOL", "HDL", "LDLCALC", "LDLDIRECT", "TRIG", "CHOLHDL"  Significant Diagnostic Results in last 30 days:  No results found.  Assessment/Plan  HTN (hypertension) Blood pressure is controlled, takes Atenolol, Losartan, Bun/creat 30/1.1 09/10/21  Vitamin D deficiency  takes Vit D  Gait abnormality ambulates with walker, w/c to go further.   Alzheimer's dementia [facility]) SNF for supportive care,  takes Donepezil, Memantine, seen neurology  Depression due to dementia  [facility]) mood is stabilized, takes Sertraline. Failed  Psych recommended GDR  Osteoarthritis, multiple sites Hx of pelvic fx-R inferior pubic ramus/sacrum, compression fx of T spine, uses Tylenol, ambulates with walker.  Hypothyroidism takes Levothyroxine, TSH 0.69 03/14/22  GERD takes Omeprazole. Hgb 12.0 09/10/21  Osteoporosis  10/09/21 DEXA t score -3.674, Ca 833m qd, Fosamx weekly  Weight loss #11 Ibs weight loss in the past month, weekly weight x4, update CBC/diff, CMP/eGFR, TSH   Family/ staff Communication: plan of care reviewed with the patient and charge nurse.   Labs/tests ordered: CBC/diff, CMP/eGFR, TSH  Time spend 35 minutes.

## 2022-05-26 NOTE — Assessment & Plan Note (Signed)
takes Levothyroxine, TSH 0.69 03/14/22

## 2022-05-26 NOTE — Assessment & Plan Note (Signed)
takes Vit D 

## 2022-05-26 NOTE — Assessment & Plan Note (Signed)
SNF for supportive care,  takes Donepezil, Memantine, seen neurology

## 2022-05-26 NOTE — Assessment & Plan Note (Signed)
Blood pressure is controlled, takes Atenolol, Losartan, Bun/creat 30/1.1 09/10/21

## 2022-05-26 NOTE — Assessment & Plan Note (Signed)
takes Omeprazole. Hgb 12.0 09/10/21

## 2022-05-26 NOTE — Assessment & Plan Note (Signed)
mood is stabilized, takes Sertraline. Failed  Psych recommended GDR

## 2022-05-26 NOTE — Assessment & Plan Note (Signed)
10/09/21 DEXA t score -3.674, Ca '800mg'$  qd, Fosamx weekly

## 2022-05-27 ENCOUNTER — Encounter: Payer: Self-pay | Admitting: Nurse Practitioner

## 2022-05-27 DIAGNOSIS — R1312 Dysphagia, oropharyngeal phase: Secondary | ICD-10-CM | POA: Diagnosis not present

## 2022-05-27 NOTE — Assessment & Plan Note (Signed)
#  11 Ibs weight loss in the past month, weekly weight x4, update CBC/diff, CMP/eGFR, TSH

## 2022-05-29 ENCOUNTER — Non-Acute Institutional Stay (SKILLED_NURSING_FACILITY): Payer: PPO | Admitting: Nurse Practitioner

## 2022-05-29 DIAGNOSIS — E559 Vitamin D deficiency, unspecified: Secondary | ICD-10-CM | POA: Diagnosis not present

## 2022-05-29 DIAGNOSIS — M150 Primary generalized (osteo)arthritis: Secondary | ICD-10-CM

## 2022-05-29 DIAGNOSIS — K219 Gastro-esophageal reflux disease without esophagitis: Secondary | ICD-10-CM | POA: Diagnosis not present

## 2022-05-29 DIAGNOSIS — E870 Hyperosmolality and hypernatremia: Secondary | ICD-10-CM | POA: Diagnosis not present

## 2022-05-29 DIAGNOSIS — I10 Essential (primary) hypertension: Secondary | ICD-10-CM | POA: Diagnosis not present

## 2022-05-29 DIAGNOSIS — M159 Polyosteoarthritis, unspecified: Secondary | ICD-10-CM | POA: Diagnosis not present

## 2022-05-29 DIAGNOSIS — M810 Age-related osteoporosis without current pathological fracture: Secondary | ICD-10-CM

## 2022-05-29 DIAGNOSIS — F02C0 Dementia in other diseases classified elsewhere, severe, without behavioral disturbance, psychotic disturbance, mood disturbance, and anxiety: Secondary | ICD-10-CM

## 2022-05-29 DIAGNOSIS — G309 Alzheimer's disease, unspecified: Secondary | ICD-10-CM

## 2022-05-29 DIAGNOSIS — R634 Abnormal weight loss: Secondary | ICD-10-CM

## 2022-05-29 DIAGNOSIS — F0393 Unspecified dementia, unspecified severity, with mood disturbance: Secondary | ICD-10-CM

## 2022-05-29 DIAGNOSIS — R269 Unspecified abnormalities of gait and mobility: Secondary | ICD-10-CM | POA: Diagnosis not present

## 2022-05-29 DIAGNOSIS — D72829 Elevated white blood cell count, unspecified: Secondary | ICD-10-CM

## 2022-05-29 DIAGNOSIS — E039 Hypothyroidism, unspecified: Secondary | ICD-10-CM

## 2022-05-29 DIAGNOSIS — B370 Candidal stomatitis: Secondary | ICD-10-CM | POA: Diagnosis not present

## 2022-05-29 NOTE — Assessment & Plan Note (Addendum)
05/29/22 wbc 13.8 Hgb 13.2, plt 286, neutrophils 85, obtain UA C/S, CXR, treating with Diflucan for thrush. 05/29/22 CXR normal chest x-ray, unable to obtain urine sample due to poor oral intake

## 2022-05-29 NOTE — Progress Notes (Signed)
Location:    [facility] Room Number: NO/03/A Place of Service: SNF SNF (31) Provider: [facility] Alliana Mcauliff NP  [name], MD  Patient Care Team: [name], MD as PCP - General (Internal Medicine)  Extended Emergency Contact Information Primary Emergency Contact: [facility] Address: [address]          [place], [place] [address] [place] of [place] Phone: [phone] Relation: Spouse Secondary Emergency Contact: Alyza, [place] Mobile Phone: [phone] Relation: Daughter Interpreter needed? No  Code Status: DNR Goals of care: Advanced Directive information    05/26/2022    3:22 PM  Advanced Directives  Does Patient Have a Medical Advance Directive? Yes  Type of Paramedic of [place];Out of facility DNR (pink MOST or yellow form)  Copy of [place] in Chart? Yes - validated most recent copy scanned in chart (See row information)  Pre-existing out of facility DNR order (yellow form or pink MOST form) Pink Most/Yellow Form available - Physician notified to receive inpatient order     Chief Complaint  Patient presents with   Acute Visit    Patient is being seen for thrush    HPI:  Pt is a 88 y.o. female seen today for an acute visit for thrush in mouth, poor oral intake, generalized weakness.  Gait abnormality, ambulates with walker, w/c to go further.              Dementia, takes Donepezil, Memantine, seen neurology             Depression, mood is stabilized, takes Sertraline. Failed  Psych recommended GDR             OA Hx of pelvic fx-R inferior pubic ramus/sacrum, compression fx of T spine, uses Tylenol, ambulates with walker.              HTN, takes Atenolol, Losartan, Bun/creat 48/0.97 05/29/22             Vit D deficiency, takes Vit D             Hypothyroidism, takes Levothyroxine, TSH 0.39 05/29/22             GERD, takes Omeprazole. Hgb 13.2 05/29/22             Left foot  plantar skin basal cell carcinoma, saw Dermatology, s/p Mohs surgery, healed             OP: 10/09/21 DEXA t score -3.674, Ca 863m qd, Fosamx weekly   Past Medical History:  Diagnosis Date   Barrett's esophagus    Dementia ([place]    Diverticulosis    Esophageal stricture    GERD (gastroesophageal reflux disease)    History of colonic polyps    History of hysterectomy    Hypertension    Hypothyroidism    Internal hemorrhoids    Kidney stone    pt denies 03/17/14   Memory difficulty 10/17/2015   Primary progressive aphasia ([place] 01/22/2018   Past Surgical History:  Procedure Laterality Date   APPENDECTOMY     with hysterectomy   BREAST EXCISIONAL BIOPSY     right x 2 1962 and 1982   THYROIDECTOMY, PARTIAL     TONSILLECTOMY      Allergies  Allergen Reactions   Penicillins Nausea Only    Allergies as of 05/29/2022       Reactions   Penicillins Nausea  Only        Medication List        Accurate as of May 29, 2022 11:59 PM. If you have any questions, ask your nurse or doctor.          acetaminophen 325 MG tablet Commonly known as: TYLENOL Take 650 mg by mouth every 6 (six) hours as needed.   alendronate 70 MG tablet Commonly known as: FOSAMAX Take 70 mg by mouth once a week. Once a day on Saturday.  Take with a full glass of water on an empty stomach.   atenolol 25 MG tablet Commonly known as: TENORMIN Take 25 mg by mouth daily.   Calcium 600+D3 Plus Minerals 600-800 MG-UNIT Tabs Take 1 tablet by mouth daily.   cholecalciferol 1000 units tablet Commonly known as: VITAMIN D Take 1,000 Units by mouth daily.   donepezil 5 MG tablet Commonly known as: ARICEPT Take 1 tablet (5 mg total) by mouth 2 (two) times daily.   levothyroxine 25 MCG tablet Commonly known as: SYNTHROID Take 25 mcg by mouth. Saturday and Sunday   levothyroxine 50 MCG tablet Commonly known as: SYNTHROID Take by mouth daily. 73m Monday thru Friday. Saturday and Sunday 286m (half tablet)   losartan 25 MG tablet Commonly known as: COZAAR Take 25 mg by mouth 2 (two) times daily. Morning and at 3:00pm   memantine 10 MG tablet Commonly known as: Namenda Take 1 tablet (10 mg total) by mouth 2 (two) times daily.   pantoprazole 20 MG tablet Commonly known as: Protonix Take 1 tablet (20 mg total) by mouth daily.   PreviDent 5000 Booster Plus 1.1 % Pste Generic drug: Sodium Fluoride Place 1 application onto teeth at bedtime.   sertraline 25 MG tablet Commonly known as: ZOLOFT Take 25 mg by mouth daily.        Review of Systems  Unable to perform ROS: Dementia    Immunization History  Administered Date(s) Administered   Influenza-Unspecified 04/22/2019, 06/06/2020, 06/12/2021   Moderna Covid-19 Vaccine Bivalent Booster 1852yr up 01/10/2022   Moderna SARS-COV2 Booster Vaccination 07/13/2020, 01/22/2021   Moderna Sars-Covid-2 Vaccination 08/29/2019, 09/26/2019   PFIZER(Purple Top)SARS-COV-2 Vaccination 05/14/2021   Pfizer Covid-19 Vaccine Bivalent Booster 12y23yrup 05/24/2021   Pneumococcal Conjugate-13 08/03/2013   Pneumococcal Polysaccharide-23 05/25/2006   Zoster Recombinat (Shingrix) 01/06/2018, 03/11/2018   Pertinent  Health Maintenance Due  Topic Date Due   INFLUENZA VACCINE  03/25/2022   DEXA SCAN  08/02/2022 (Originally 05/24/1996)      01/08/2017    3:42 PM 01/22/2018   10:45 AM 02/24/2020    8:37 AM  Fall Risk  Falls in the past year? No No   Patient Fall Risk Level   Low fall risk   Functional Status Survey:    Vitals:   05/30/22 0833  BP: 111/79  Pulse: 75  Resp: 17  Temp: (!) 97.5 F (36.4 C)  SpO2: 95%  Weight: 85 lb (38.6 kg)  Height: 4' 11" (1.499 m)   Body mass index is 17.17 kg/m. Physical Exam Vitals reviewed.  Constitutional:      Comments: fatigue  HENT:     Head: Normocephalic and atraumatic.     Mouth/Throat:     Mouth: Mucous membranes are dry.     Comments: Thick yellow coating on tongue and oral  linings.  Eyes:     Conjunctiva/sclera: Conjunctivae normal.     Pupils: Pupils are equal, round, and reactive to light.  Cardiovascular:  Rate and Rhythm: Normal rate and regular rhythm.     Heart sounds: No murmur heard. Pulmonary:     Effort: Pulmonary effort is normal.     Breath sounds: No rales.  Abdominal:     General: Bowel sounds are normal.     Palpations: Abdomen is soft.     Tenderness: There is no abdominal tenderness.  Musculoskeletal:     Cervical back: Normal range of motion and neck supple.     Right lower leg: No edema.     Left lower leg: No edema.     Comments: Ambulates with walker, w/c to go further   Skin:    General: Skin is warm and dry.     Comments: Left midfoot plantar aspect skin lesion(hx of BCC) is healed  Neurological:     General: No focal deficit present.     Mental Status: She is alert. Mental status is at baseline.     Gait: Gait abnormal.     Comments: Oriented to person  Psychiatric:        Mood and Affect: Mood normal.     Labs reviewed: Recent Labs    09/10/21 0000  NA 139  K 4.0  CL 106  CO2 27*  BUN 30*  CREATININE 1.1  CALCIUM 8.8   Recent Labs    09/10/21 0000  AST 10*  ALT 9  ALKPHOS 74  ALBUMIN 3.6   Recent Labs    09/10/21 0000  WBC 8.4  NEUTROABS 5,653.00  HGB 12.0  HCT 36  PLT 177   Lab Results  Component Value Date   TSH 0.69 03/14/2022   No results found for: "HGBA1C" No results found for: "CHOL", "HDL", "LDLCALC", "LDLDIRECT", "TRIG", "CHOLHDL"  Significant Diagnostic Results in last 30 days:  No results found.  Assessment/Plan: Leukocytosis 05/29/22 wbc 13.8 Hgb 13.2, plt 286, neutrophils 85, obtain UA C/S, CXR, treating with Diflucan for thrush. 05/29/22 CXR normal chest x-ray, unable to obtain urine sample due to poor oral intake  Oral thrush Diflucan 100mg qd x 7 days, update CBC/diff, CMP/eGFR in am  Hypernatremia Na 149, K 4.4, Bun 48, creat 0.97, D5 1/2 NS 75cc/hr x2000ml if  possible, encourage oral fluid intakes, ED eval if HPOA desires.   Hypothyroidism  decrease Levothyroxine 25mcg M_F,  TSH 0.39 05/29/22, update TSH 3 months.   Gait abnormality ambulates with walker, w/c to go further.   Alzheimer's dementia (HCC)  Decrease Donepezil 5mg qd/bid to help her appetite, continue Memantine, seen neurology  Depression due to dementia (HCC)  mood is stabilized, takes Sertraline. Failed  Psych recommended GDR  Osteoarthritis, multiple sites Hx of pelvic fx-R inferior pubic ramus/sacrum, compression fx of T spine, uses Tylenol, ambulates with walker.   HTN (hypertension) Blood pressure is controlled, takes Atenolol, Losartan, Bun/creat 48/0.97 05/29/22  Vitamin D deficiency takes Vit D  GERD Stable, takes Omeprazole. Hgb 13.2 05/29/22  Osteoporosis 10/09/21 DEXA t score -3.674, Ca 800mg qd, Fosamx weekly   Weight loss Poor oral intake, trended down TSH, Donepezil possibly contributing, encourage oral fluid intake, decrease Levothyroxine, reduce Donepezil, supportive care.      Family/ staff Communication: plan of care reviewed with the patient and charge nurse.   Labs/tests ordered: CBC/diff, CMP/eGFR, UA C/S, CXR  Time spend 35 minutes.  

## 2022-05-30 ENCOUNTER — Encounter: Payer: Self-pay | Admitting: Nurse Practitioner

## 2022-05-30 DIAGNOSIS — B370 Candidal stomatitis: Secondary | ICD-10-CM | POA: Insufficient documentation

## 2022-05-30 DIAGNOSIS — E870 Hyperosmolality and hypernatremia: Secondary | ICD-10-CM | POA: Insufficient documentation

## 2022-05-30 NOTE — Assessment & Plan Note (Signed)
takes Vit D 

## 2022-05-30 NOTE — Assessment & Plan Note (Signed)
Poor oral intake, trended down TSH, Donepezil possibly contributing, encourage oral fluid intake, decrease Levothyroxine, reduce Donepezil, supportive care.

## 2022-05-30 NOTE — Assessment & Plan Note (Signed)
Na 149, K 4.4, Bun 48, creat 0.97, D5 1/2 NS 75cc/hr x2063m if possible, encourage oral fluid intakes, ED eval if HPOA desires.

## 2022-05-30 NOTE — Assessment & Plan Note (Signed)
ambulates with walker, w/c to go further 

## 2022-05-30 NOTE — Assessment & Plan Note (Signed)
Hx of pelvic fx-R inferior pubic ramus/sacrum, compression fx of T spine, uses Tylenol, ambulates with walker.

## 2022-05-30 NOTE — Assessment & Plan Note (Signed)
Stable, takes Omeprazole. Hgb 13.2 05/29/22

## 2022-05-30 NOTE — Assessment & Plan Note (Signed)
Diflucan 145m qd x 7 days, update CBC/diff, CMP/eGFR in am

## 2022-05-30 NOTE — Assessment & Plan Note (Signed)
mood is stabilized, takes Sertraline. Failed  Psych recommended GDR

## 2022-05-30 NOTE — Assessment & Plan Note (Signed)
decrease Levothyroxine 26mg M_F,  TSH 0.39 05/29/22, update TSH 3 months.

## 2022-05-30 NOTE — Assessment & Plan Note (Signed)
Decrease Donepezil '5mg'$  qd/bid to help her appetite, continue Memantine, seen neurology

## 2022-05-30 NOTE — Assessment & Plan Note (Signed)
Blood pressure is controlled, takes Atenolol, Losartan, Bun/creat 48/0.97 05/29/22

## 2022-05-30 NOTE — Assessment & Plan Note (Signed)
10/09/21 DEXA t score -3.674, Ca '800mg'$  qd, Fosamx weekly

## 2022-06-25 DEATH — deceased

## 2022-10-02 IMAGING — CT CT PELVIS W/O CM
1 series · 15 of 32 positions shown, 19 images · non-contrast
Comparison: None.

CLINICAL DATA: Hip pain, stress fracture suspected

EXAM:
CT PELVIS WITHOUT CONTRAST
TECHNIQUE: Multidetector CT imaging of the pelvis was performed following the
standard protocol without intravenous contrast.
RADIATION DOSE REDUCTION: This exam was performed according to the
departmental dose-optimization program which includes automated
exposure control, adjustment of the mA and/or kV according to
patient size and/or use of iterative reconstruction technique.

[Series 3: soft tissue pelvis/hip · axial · 0.76mm/px · z∈[+570,+822]mm · 15 of 94 slices shown, 19 images]
[im 7/94  soft-tissue]
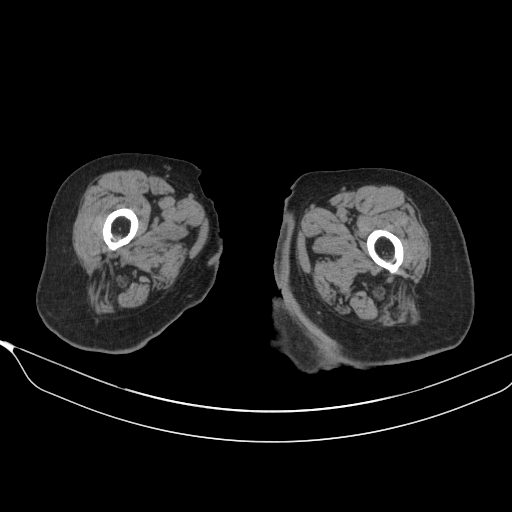
[im 7/94  bone]
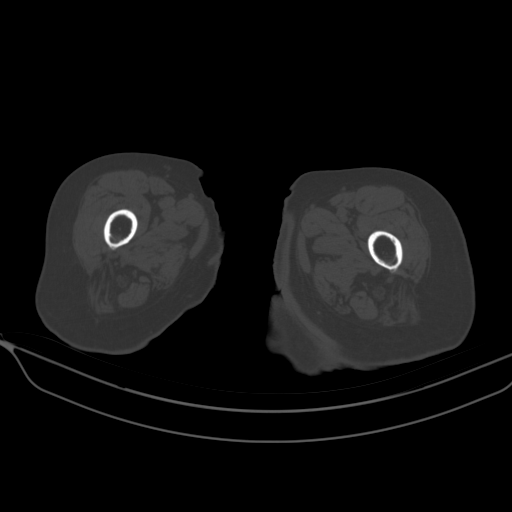
[im 13/94  soft-tissue]
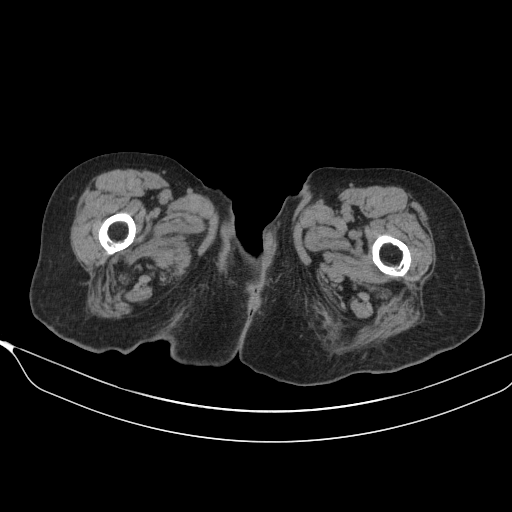
[im 19/94  soft-tissue]
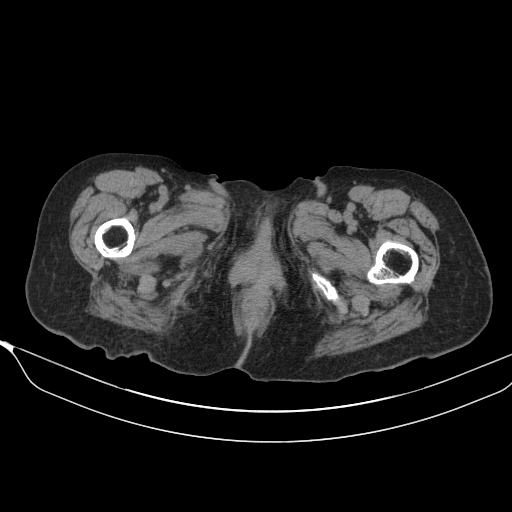
[im 28/94  soft-tissue]
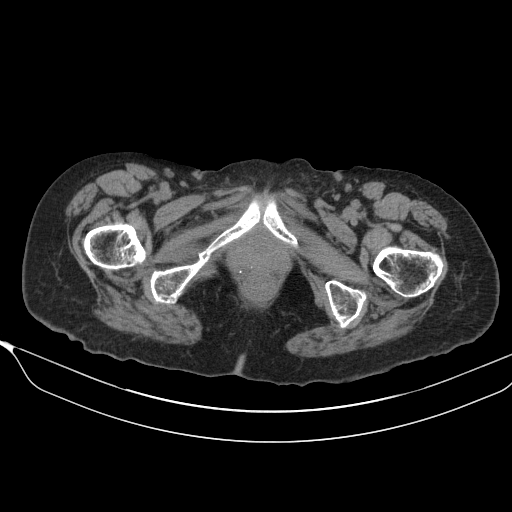
[im 34/94  soft-tissue]
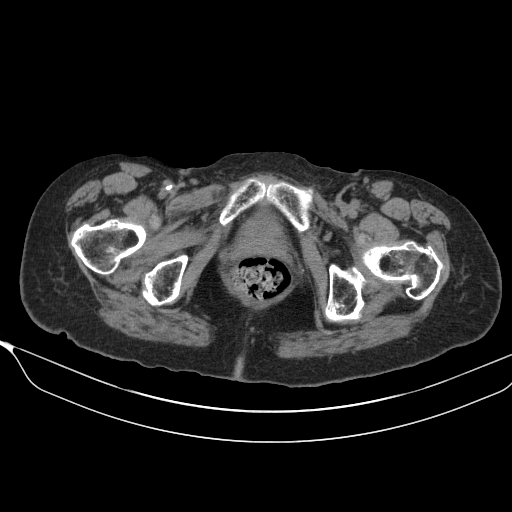
[im 40/94  soft-tissue]
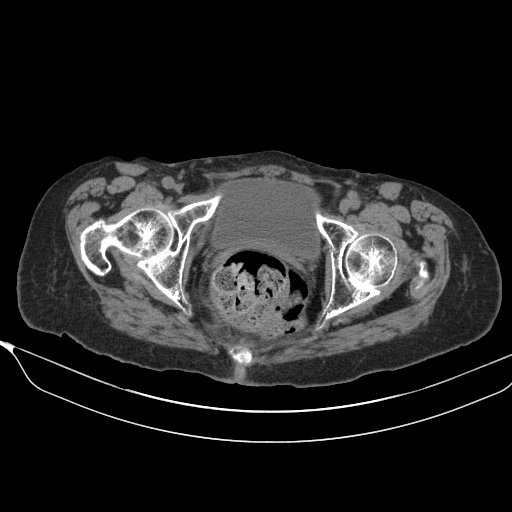
[im 49/94  soft-tissue]
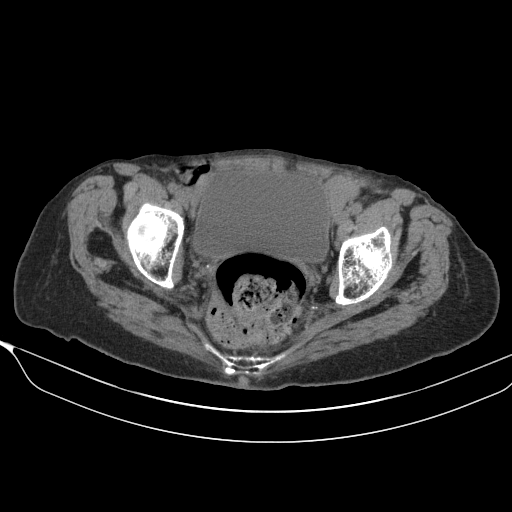
[im 55/94  soft-tissue]
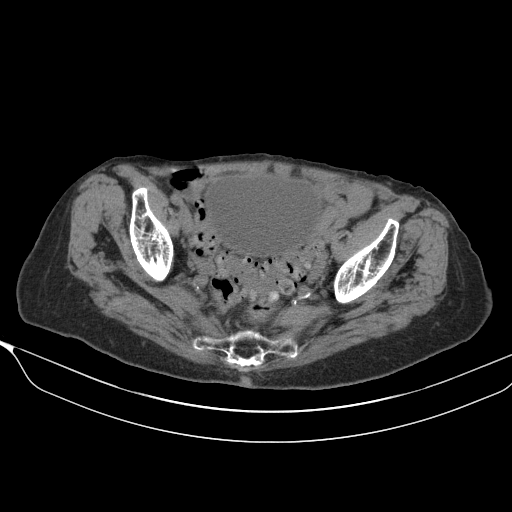
[im 61/94  soft-tissue]
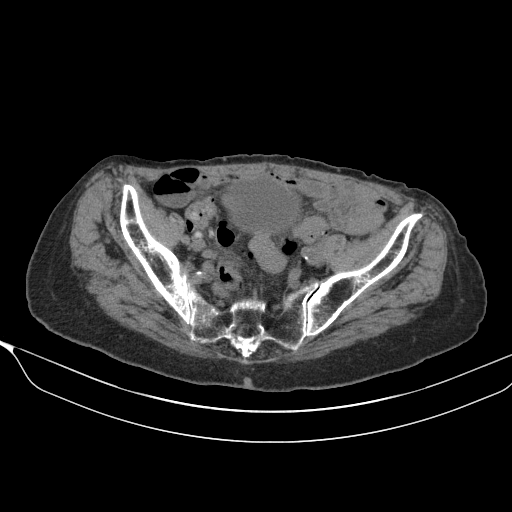
[im 61/94  bone]
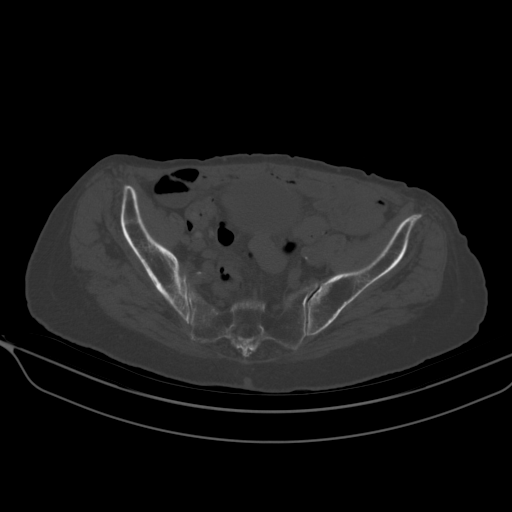
[im 67/94  soft-tissue]
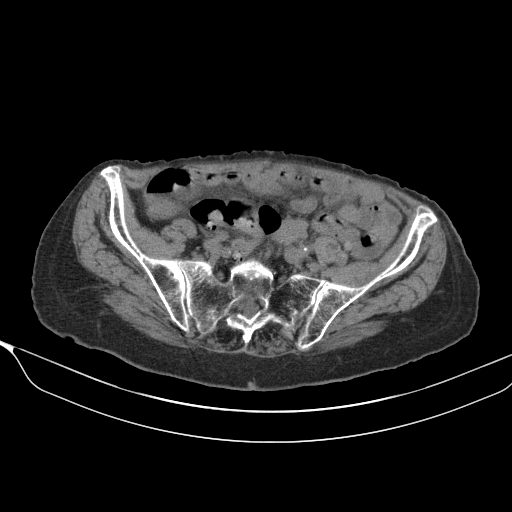
[im 76/94  soft-tissue]
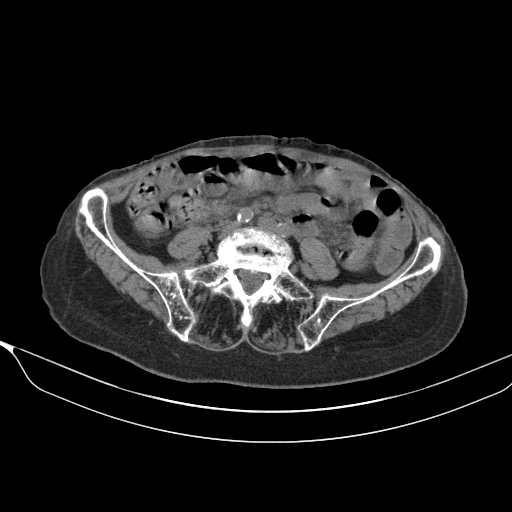
[im 82/94  soft-tissue]
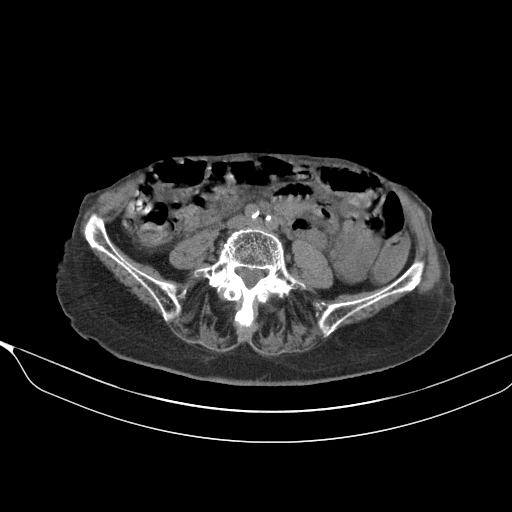
[im 82/94  lung]
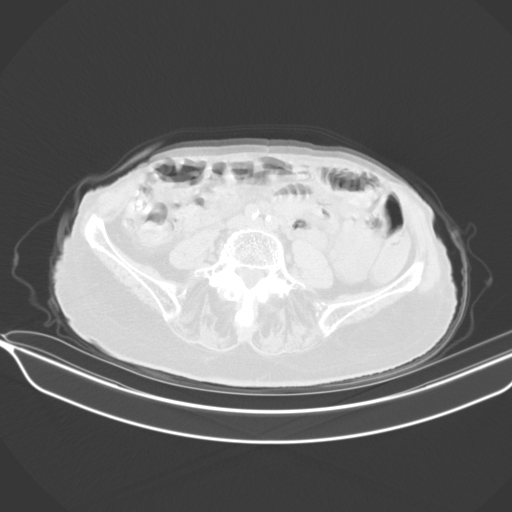
[im 85/94  lung]
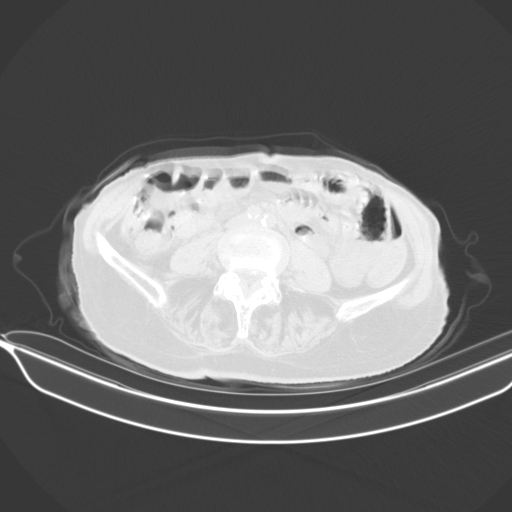
[im 88/94  soft-tissue]
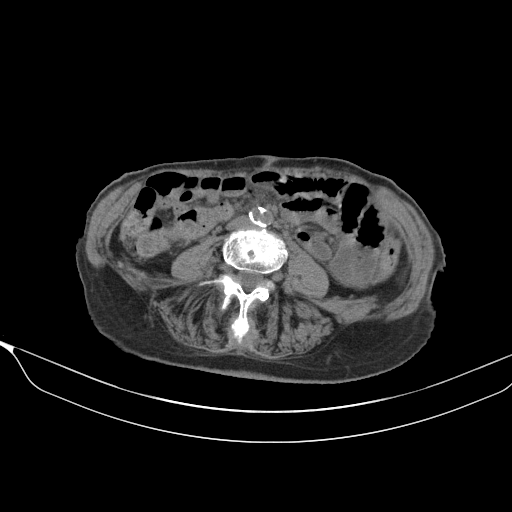
[im 88/94  lung]
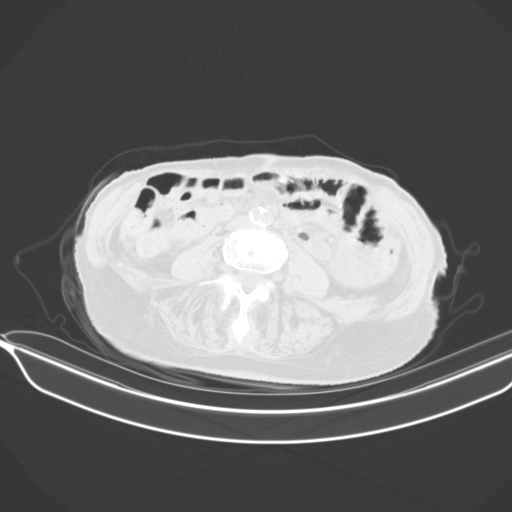
[im 91/94  lung]
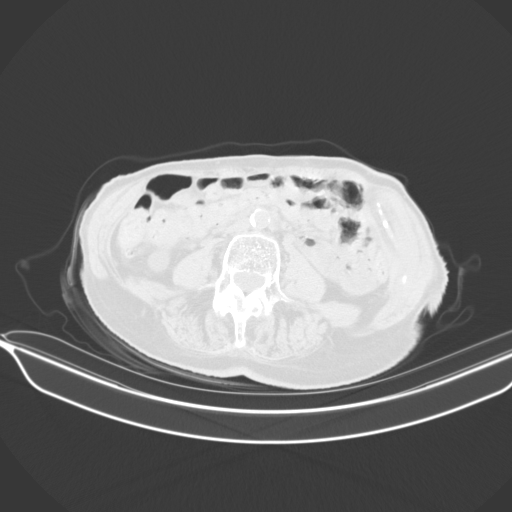

[15 of 32 positions shown; findings below may reference images not displayed]

FINDINGS: Urinary Tract:  Urinary bladder appears within normal limits.

Bowel: No evidence of bowel obstruction. Colonic diverticulosis.
Retained fecal material in the colon and rectum.

Vascular/Lymphatic: Atherosclerotic disease. No bulky
lymphadenopathy visualized.

Reproductive:  Hysterectomy changes.

Other:  None.

Musculoskeletal: Acute nondisplaced fracture of the right inferior
pubic ramus and very subtle suspected acute nondisplaced fracture in
the right sacrum centered at the level of S2. Bones are osteopenic.
No suspicious bony lesions identified.
IMPRESSION: 1. Acute nondisplaced fracture of the right inferior pubic ramus and
suspected subtle acute nondisplaced fracture in the right sacrum.
2. Colonic diverticulosis.

## 2022-10-02 IMAGING — CT CT HIP*R* W/O CM
1 series · 15 of 32 positions shown, 19 images · non-contrast
Comparison: None.

CLINICAL DATA: Hip pain, stress fracture suspected.aa

EXAM:
CT OF THE RIGHT HIP WITHOUT CONTRAST
TECHNIQUE: Multidetector CT imaging of the right hip was performed according to
the standard protocol. Multiplanar CT image reconstructions were
also generated.
RADIATION DOSE REDUCTION: This exam was performed according to the
departmental dose-optimization program which includes automated
exposure control, adjustment of the mA and/or kV according to
patient size and/or use of iterative reconstruction technique.

[Series 3: soft tissue pelvis/hip · axial · 0.39mm/px · z∈[+586,+808]mm · 15 of 83 slices shown, 19 images]
[im 6/83  soft-tissue]
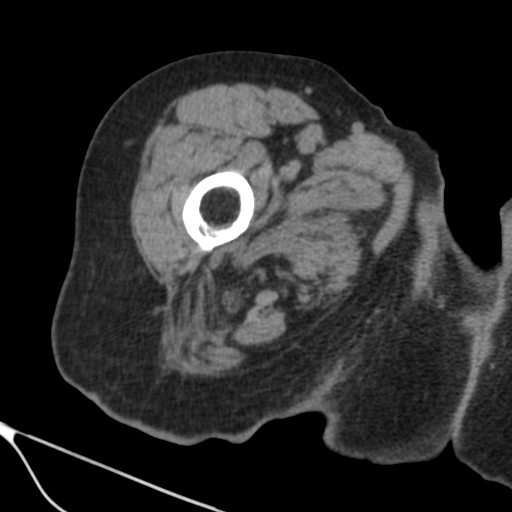
[im 6/83  bone]
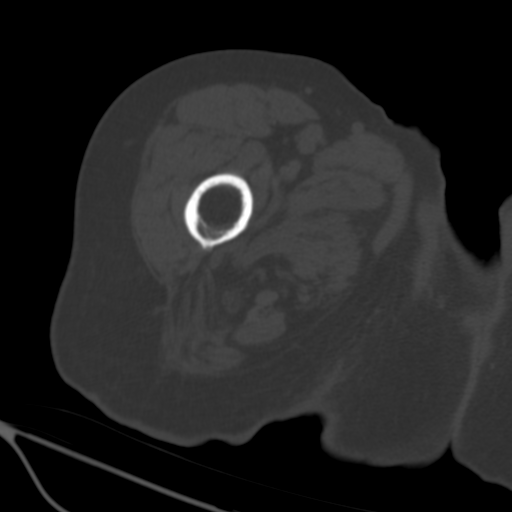
[im 11/83  soft-tissue]
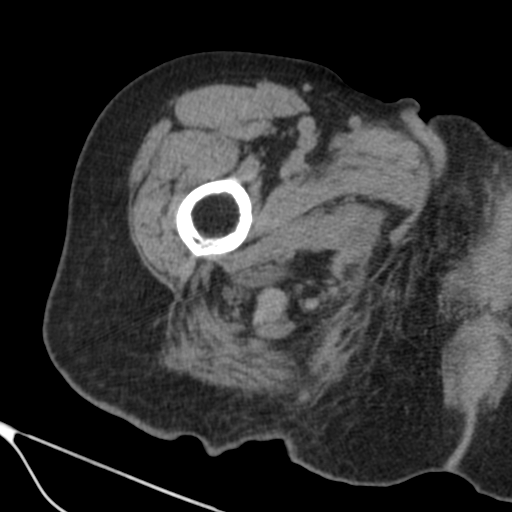
[im 16/83  soft-tissue]
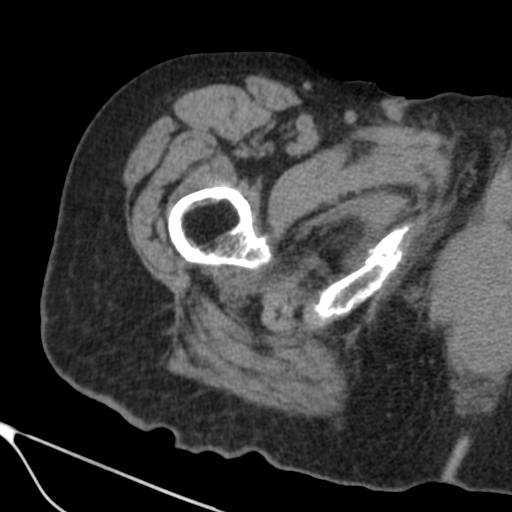
[im 24/83  soft-tissue]
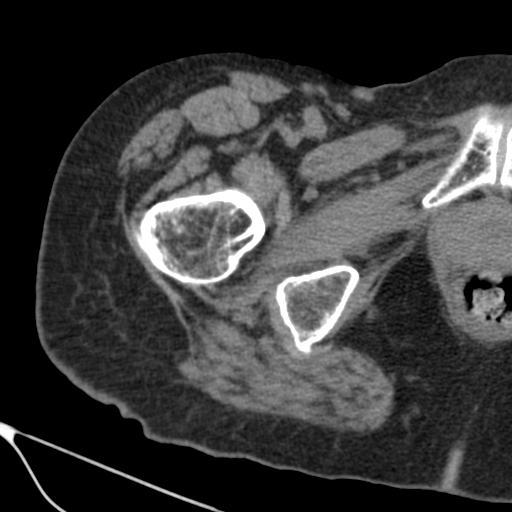
[im 30/83  soft-tissue]
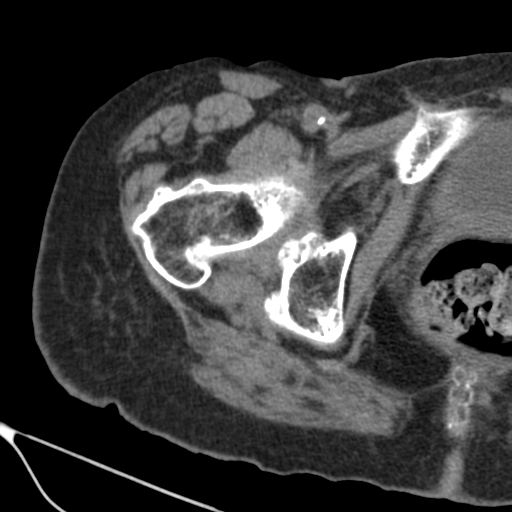
[im 35/83  soft-tissue]
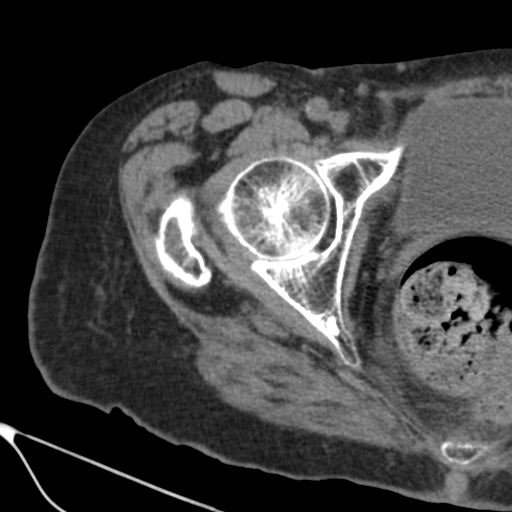
[im 43/83  soft-tissue]
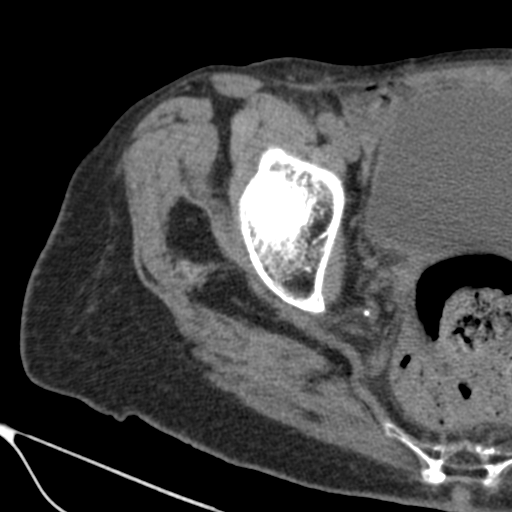
[im 48/83  soft-tissue]
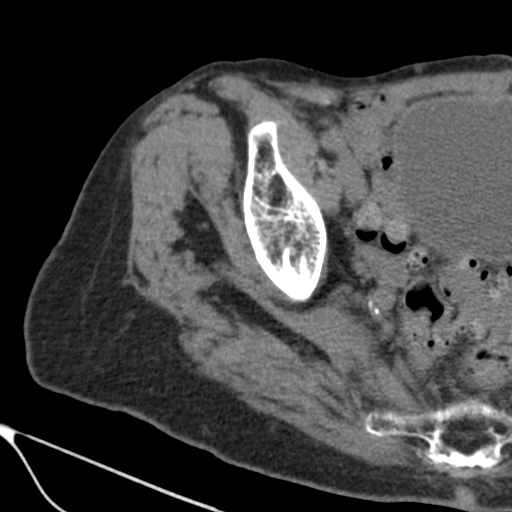
[im 53/83  soft-tissue]
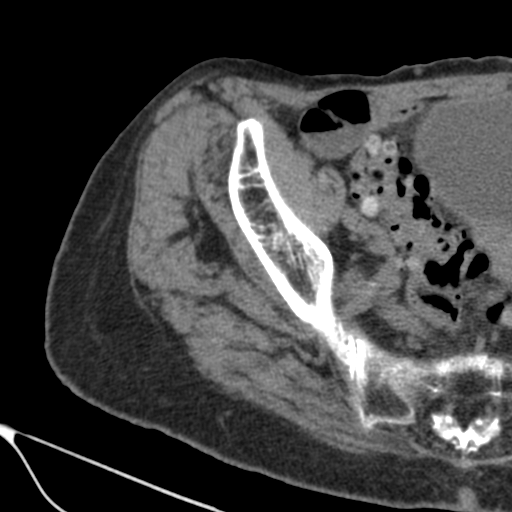
[im 53/83  bone]
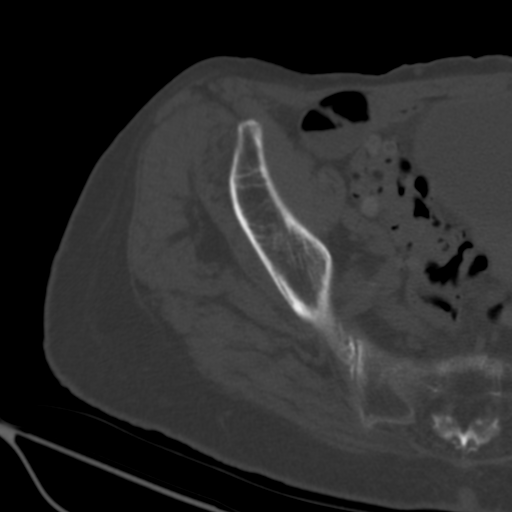
[im 59/83  soft-tissue]
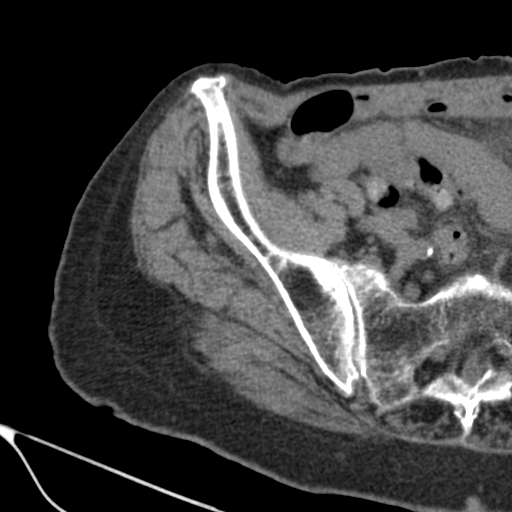
[im 67/83  soft-tissue]
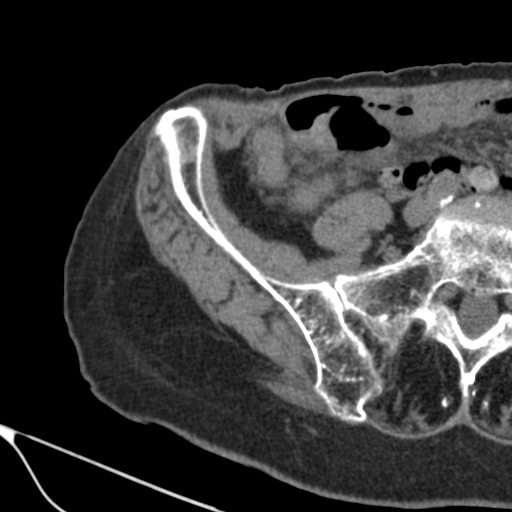
[im 72/83  soft-tissue]
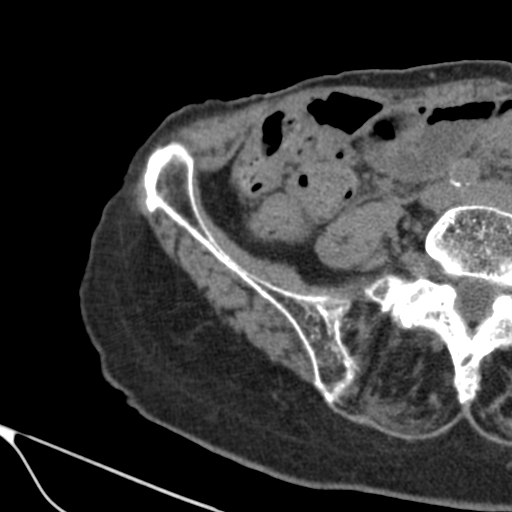
[im 72/83  lung]
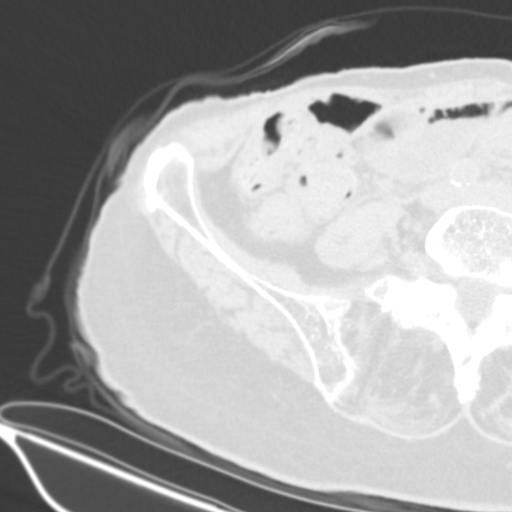
[im 75/83  lung]
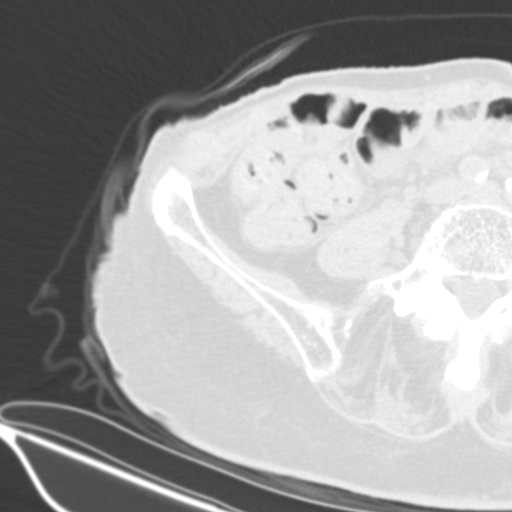
[im 77/83  soft-tissue]
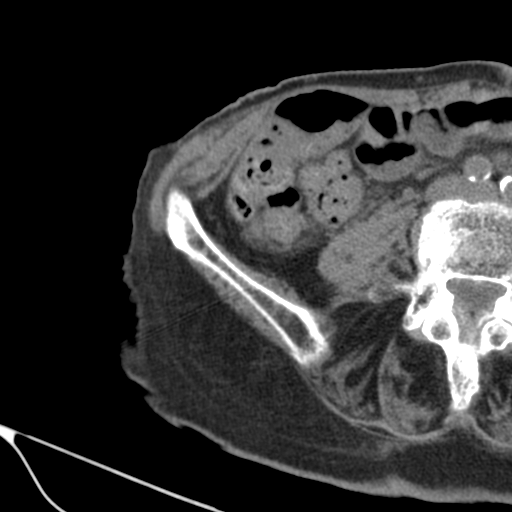
[im 77/83  lung]
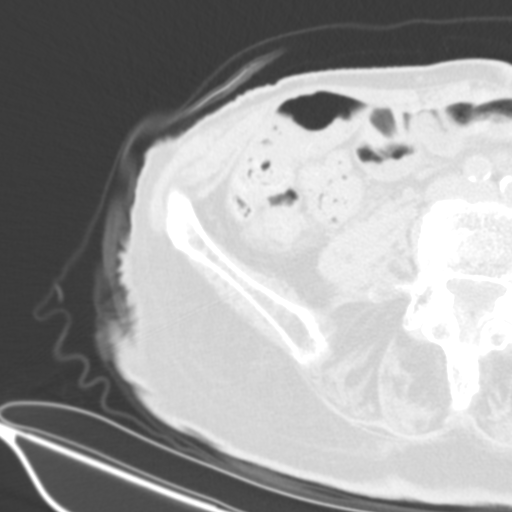
[im 80/83  lung]
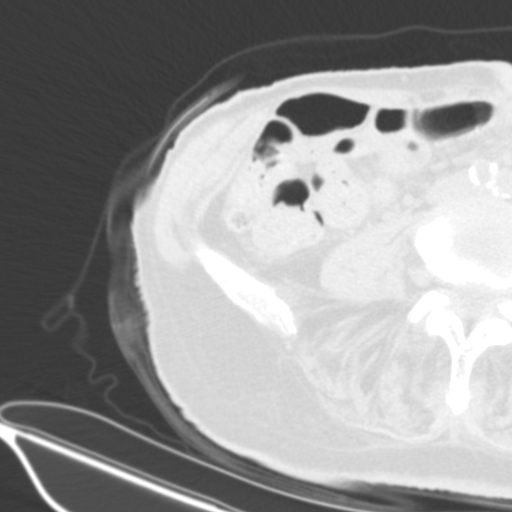

[15 of 32 positions shown; findings below may reference images not displayed]

FINDINGS: Bones/Joint/Cartilage

There is mildly displaced fracture of the inferior pubic ramus
(series 3 image [id number]). No other appreciable fracture. No
significant hip arthropathy. Degenerate disc disease of the lower
lumbar spine prominent at L5-S1 with facet joint arthropathy.
Sacroiliac joint and pubic symphysis are intact.

Ligaments

Suboptimally assessed by CT.

Muscles and Tendons

No acute abnormality. No hematoma or fluid collection. No muscle
atrophy.

Soft tissues

Skin and subcutaneous soft tissues are within normal limits.
IMPRESSION: Mildly displaced fracture of the right inferior pubic ramus.
